# Patient Record
Sex: Female | Born: 1940
Health system: Southern US, Community
[De-identification: ages and names within clinical notes are randomized; demographics above are authoritative.]

## PROBLEM LIST (undated history)

## (undated) DIAGNOSIS — Z950 Presence of cardiac pacemaker: Secondary | ICD-10-CM

## (undated) DIAGNOSIS — F32A Depression, unspecified: Secondary | ICD-10-CM

## (undated) DIAGNOSIS — I4891 Unspecified atrial fibrillation: Secondary | ICD-10-CM

## (undated) DIAGNOSIS — F329 Major depressive disorder, single episode, unspecified: Secondary | ICD-10-CM

## (undated) DIAGNOSIS — I10 Essential (primary) hypertension: Secondary | ICD-10-CM

## (undated) DIAGNOSIS — G629 Polyneuropathy, unspecified: Secondary | ICD-10-CM

## (undated) HISTORY — DX: Major depressive disorder, single episode, unspecified: F32.9

## (undated) HISTORY — DX: Polyneuropathy, unspecified: G62.9

## (undated) HISTORY — DX: Depression, unspecified: F32.A

## (undated) HISTORY — PX: CATARACT EXTRACTION W/ INTRAOCULAR LENS  IMPLANT, BILATERAL: SHX1307

## (undated) HISTORY — PX: THROAT SURGERY: SHX803

---

## 1972-10-27 HISTORY — PX: VAGINAL HYSTERECTOMY: SUR661

## 1993-10-27 HISTORY — PX: BUNIONECTOMY: SHX129

## 1997-01-18 ENCOUNTER — Encounter: Payer: Self-pay | Admitting: Internal Medicine

## 2003-11-02 ENCOUNTER — Encounter: Payer: Self-pay | Admitting: Internal Medicine

## 2004-09-04 ENCOUNTER — Ambulatory Visit: Payer: Self-pay | Admitting: Internal Medicine

## 2004-10-07 ENCOUNTER — Ambulatory Visit: Payer: Self-pay | Admitting: Internal Medicine

## 2004-12-09 ENCOUNTER — Ambulatory Visit: Payer: Self-pay | Admitting: Internal Medicine

## 2005-04-10 ENCOUNTER — Ambulatory Visit: Payer: Self-pay | Admitting: Internal Medicine

## 2005-08-11 ENCOUNTER — Ambulatory Visit: Payer: Self-pay | Admitting: Internal Medicine

## 2005-12-15 ENCOUNTER — Ambulatory Visit: Payer: Self-pay | Admitting: Internal Medicine

## 2006-03-05 ENCOUNTER — Ambulatory Visit: Payer: Self-pay | Admitting: Internal Medicine

## 2006-07-23 ENCOUNTER — Ambulatory Visit: Payer: Self-pay | Admitting: Internal Medicine

## 2006-08-04 ENCOUNTER — Ambulatory Visit: Payer: Self-pay | Admitting: Internal Medicine

## 2006-12-10 ENCOUNTER — Ambulatory Visit: Payer: Self-pay | Admitting: Internal Medicine

## 2007-04-02 ENCOUNTER — Encounter: Payer: Self-pay | Admitting: Internal Medicine

## 2007-04-02 DIAGNOSIS — G609 Hereditary and idiopathic neuropathy, unspecified: Secondary | ICD-10-CM

## 2007-04-02 DIAGNOSIS — F329 Major depressive disorder, single episode, unspecified: Secondary | ICD-10-CM

## 2007-05-17 ENCOUNTER — Ambulatory Visit: Payer: Self-pay | Admitting: Internal Medicine

## 2007-09-17 ENCOUNTER — Ambulatory Visit: Payer: Self-pay | Admitting: Internal Medicine

## 2008-01-24 ENCOUNTER — Telehealth (INDEPENDENT_AMBULATORY_CARE_PROVIDER_SITE_OTHER): Payer: Self-pay | Admitting: *Deleted

## 2008-07-24 ENCOUNTER — Ambulatory Visit: Payer: Self-pay | Admitting: Internal Medicine

## 2009-01-02 ENCOUNTER — Ambulatory Visit: Payer: Self-pay | Admitting: Internal Medicine

## 2009-07-20 ENCOUNTER — Telehealth: Payer: Self-pay | Admitting: Internal Medicine

## 2010-01-21 ENCOUNTER — Telehealth: Payer: Self-pay | Admitting: Internal Medicine

## 2010-02-26 ENCOUNTER — Encounter: Payer: Self-pay | Admitting: Internal Medicine

## 2010-05-06 ENCOUNTER — Ambulatory Visit: Payer: Self-pay | Admitting: Internal Medicine

## 2010-05-06 DIAGNOSIS — G5620 Lesion of ulnar nerve, unspecified upper limb: Secondary | ICD-10-CM

## 2010-05-06 DIAGNOSIS — Z634 Disappearance and death of family member: Secondary | ICD-10-CM

## 2010-05-06 DIAGNOSIS — F4329 Adjustment disorder with other symptoms: Secondary | ICD-10-CM

## 2010-05-08 LAB — CONVERTED CEMR LAB
ALT: 11 units/L (ref 0–35)
AST: 17 units/L (ref 0–37)
Albumin: 4.3 g/dL (ref 3.5–5.2)
Basophils Relative: 0.6 % (ref 0.0–3.0)
Bilirubin, Direct: 0.2 mg/dL (ref 0.0–0.3)
Calcium: 9.8 mg/dL (ref 8.4–10.5)
Chloride: 107 meq/L (ref 96–112)
Eosinophils Relative: 1 % (ref 0.0–5.0)
Glucose, Bld: 84 mg/dL (ref 70–99)
Hemoglobin: 15.6 g/dL — ABNORMAL HIGH (ref 12.0–15.0)
Lymphocytes Relative: 43.2 % (ref 12.0–46.0)
MCV: 98.1 fL (ref 78.0–100.0)
Neutro Abs: 3.5 10*3/uL (ref 1.4–7.7)
Neutrophils Relative %: 49.4 % (ref 43.0–77.0)
Phosphorus: 3.4 mg/dL (ref 2.3–4.6)
Potassium: 4.9 meq/L (ref 3.5–5.1)
RBC: 4.56 M/uL (ref 3.87–5.11)
Sodium: 143 meq/L (ref 135–145)
Total Bilirubin: 0.6 mg/dL (ref 0.3–1.2)
Total Protein: 6.8 g/dL (ref 6.0–8.3)
WBC: 7.1 10*3/uL (ref 4.5–10.5)

## 2010-08-22 ENCOUNTER — Ambulatory Visit: Payer: Self-pay | Admitting: Internal Medicine

## 2010-09-27 ENCOUNTER — Ambulatory Visit: Payer: Self-pay | Admitting: Internal Medicine

## 2010-11-26 NOTE — Assessment & Plan Note (Signed)
Summary: NERVES/CLE   Vital Signs:  Patient profile:   70 year old female Weight:      162 pounds Temp:     98.8 degrees F oral BP sitting:   128 / 60  (left arm) Cuff size:   regular  Vitals Entered By: Mervin Hack CMA Duncan Dull) (August 22, 2010 2:50 PM) CC: nerves?   History of Present Illness: Still having lots of stress Husband is wearing her out "more emotionally than physically" won't allow her to shower him, complains all the time doesn't want to get out of bed in the morning  This has been his personality all his life More belligerent over the past couple of years especially Has aide from [place] 4hours, 3 days per week but he doesn't allow her to bathe him, etc  She has remodeled to put in handicapped shower has tried 3 different wheelchairs--he just can't be satisfied  trazodone does help sleep perhaps a little--but by the time she goes to sleep "I am so wound up"  Allergies: 1)  ! Penicillin V Potassium (Penicillin V Potassium)  Past History:  Past medical, surgical, family and social histories (including risk factors) reviewed for relevance to current acute and chronic problems.  Past Medical History: Reviewed history from 04/02/2007 and no changes required. Depression Neuropathy--idiopathic  Past Surgical History: Reviewed history from 04/02/2007 and no changes required. 1974    Hysterectomy 1963    VD X 1  ~1995  Bilateral foot surgery 3/98     Spinal cord stimulator, didn't help  Family History: Reviewed history from 04/02/2007 and no changes required. 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   Psych:  normally interactive, good eye contact, not anxious appearing, and dysphoric affect.     Impression & Recommendations:  Problem # 1:  DEPRESSION (ICD-311) Assessment Deteriorated mostly worse due to caregiver stress he doesn't allow others to care for him she needs help I will talk to him at his visit tomorrow Gave her [employer] of Rights  will try low dose citalopram  Her updated medication list for this problem includes:    Trazodone Hcl 50 Mg Tabs (Trazodone hcl) .Marland Kitchen... 1-2 tabs at bedtime to help sleep    Citalopram Hydrobromide 10 Mg Tabs (Citalopram hydrobromide) .Marland Kitchen... 1 tab daily for depression  Complete Medication List: 1)  Gabapentin 600 Mg Tabs (Gabapentin) .Marland Kitchen.. 1 tab at bedtime for nerve pain 2)  Trazodone Hcl 50 Mg Tabs (Trazodone hcl) .Marland Kitchen.. 1-2 tabs at bedtime to help sleep 3)  Aspir-low 81 Mg Tbec (Aspirin) .Marland Kitchen.. 1 by mouth daily 4)  Calcium Carbonate 600 Mg Tabs (Calcium carbonate) .... Take 1 by mouth once daily 5)  Citalopram Hydrobromide 10 Mg Tabs (Citalopram hydrobromide) .Marland Kitchen.. 1 tab daily for depression  Other Orders: Pneumococcal Vaccine (16109) Admin 1st Vaccine (60454) Influenza Vaccine MCR (09811)  Patient Instructions: 1)  Please schedule a follow-up appointment in 1 month.  Prescriptions: CITALOPRAM HYDROBROMIDE 10 MG TABS (CITALOPRAM HYDROBROMIDE) 1  tab daily for depression  #30 x 11   Entered and Authorized by:   Cindee Salt MD   Signed by:   Cindee Salt MD on 08/22/2010   Method used:   [facility]  [address] [phone][facility]       [address]       [place], [place]  [address]       Ph: [phone]       Fax: [phone]   RxID:   [id number]    Orders Added: 1)  Est. Patient Level III [24401] 2)  Pneumococcal Vaccine [90732] 3)  Admin 1st Vaccine  [90471] 4)  Influenza Vaccine MCR [00025]   Immunizations Administered:  Pneumonia Vaccine:    Vaccine Type: Pneumovax (Medicare)    Site: right deltoid    Mfr: Merck    Dose: 0.5 ml    Route: IM    Given by: Mervin Hack CMA (AAMA)    Exp. Date: 01/09/2012    Lot #: [id number]    VIS given: 10/01/09 version given August 22, 2010.  Influenza Vaccine # 1:    Vaccine Type: Fluvax MCR    Site: left deltoid    Mfr: Sanofi Pasteur    Dose: 0.5 ml    Route: IM    Given by: Mervin Hack CMA (AAMA)    Exp. Date: 04/26/2011    Lot #: [id number]    VIS given: 05/21/10 version given August 22, 2010.  Flu Vaccine Consent Questions:    Do you have a history of severe allergic reactions to this vaccine? no    Any prior history of allergic reactions to egg and/or gelatin? no    Do you have a sensitivity to the preservative Thimersol? no    Do you have a past history of Guillan-Barre Syndrome? no    Do you currently have an acute febrile illness? no    Have you ever had a severe reaction to latex? no    Vaccine information given and explained to patient? yes    Are you currently pregnant? no   Immunizations Administered:  Pneumonia Vaccine:    Vaccine Type: Pneumovax (Medicare)    Site: right deltoid    Mfr: Merck    Dose: 0.5 ml    Route: IM    Given by: Mervin Hack CMA (AAMA)    Exp. Date: 01/09/2012    Lot #: [id number]    VIS given: 10/01/09 version given August 22, 2010.  Influenza Vaccine # 1:    Vaccine Type: Fluvax MCR    Site: left deltoid    Mfr: Sanofi Pasteur    Dose: 0.5 ml    Route: IM    Given by: Mervin Hack CMA (AAMA)    Exp. Date: 04/26/2011    Lot #: [id number]    VIS given: 05/21/10 version given August 22, 2010.  Current Allergies (reviewed today): ! PENICILLIN V POTASSIUM (PENICILLIN V POTASSIUM)

## 2010-11-26 NOTE — Letter (Signed)
Summary: Handicapped Placard/[facility]  Handicapped Placard/[facility]   Imported By: Lanelle Bal 03/05/2010 11:29:29  _____________________________________________________________________  External Attachment:    Type:   Image     Comment:   External Document

## 2010-11-26 NOTE — Progress Notes (Signed)
Summary: NEURONTIN  Phone Note [facility]ill Request [id number] on January 21, 2010 4:07 PM  Refills Requested: Medication #1:  NEURONTIN 600 MG TABS 1/2 tab two times a day/.   Last Refilled: 01/04/2009 E-Scribe Request, pt was last seen in 2008, no appt has been made. Please advise.   Method Requested: Electronic Initial call taken by: Mervin Hack CMA Duncan Dull),  January 21, 2010 4:08 PM  Follow-up for Phone Call        set up appt when husband next in (or at another time if she prefers)  okay to send 2 months supply I see her regularly with husband so I didn't realize she hadn't been seen in that long May need to just check that she doesn't have 2 charts since it doesn't seem like it has been that long Follow-up by: Cindee Salt MD,  January 21, 2010 5:57 PM  Additional Follow-up for Phone Call Additional follow up Details #1::        patient only has 1 chart. DeShannon Smith CMA Duncan Dull)  January 22, 2010 9:04 AM     Prescriptions: NEURONTIN 600 MG TABS (GABAPENTIN) 1/2 tab two times a day/  #90 x 0   Entered by:   Mervin Hack CMA (AAMA)   Authorized by:   Cindee Salt MD   Signed by:   Mervin Hack CMA (AAMA) on 01/21/2010   Method used:   [facility]        [facility] [phone][facility] (retail)       [address]       [place], [place]  [address]       Ph: [phone]       Fax: [phone]   RxID:   [id number]

## 2010-11-26 NOTE — Assessment & Plan Note (Signed)
Summary: 1 M F/U DLO   Vital Signs:  Patient profile:   70 year old female Height:      62.5 inches Weight:      161.50 pounds BMI:     29.17 Temp:     98.8 degrees F oral Pulse rate:   68 / minute Pulse rhythm:   regular BP sitting:   130 / 80  (left arm) Cuff size:   large  Vitals Entered By: Linde Gillis CMA Duncan Dull) (September 27, 2010 1:56 PM) CC: 1 month follow up   History of Present Illness: She  is doing better The medication for husband has really helped his demeanor Has aide helping with his legs and he is making progress---better balance and use of leg  Much easier on her since he has better spirits still physically tired Now 8 years since his stroke and still has constant caregiving   Still not sleeping great--can initiate but can't maintain Discussed sleep hygiene   Allergies: 1)  ! Penicillin V Potassium (Penicillin V Potassium)  Past History:  Past medical, surgical, family and social histories (including risk factors) reviewed for relevance to current acute and chronic problems.  Past Medical History: Reviewed history from 04/02/2007 and no changes required. Depression Neuropathy--idiopathic  Past Surgical History: Reviewed history from 04/02/2007 and no changes required. 1974    Hysterectomy 1963    VD X 1  ~1995  Bilateral foot surgery 3/98     Spinal cord stimulator, didn't help  Family History: Reviewed history from 04/02/2007 and no changes required. Father: Died 40 colon CA Mother: Died 34 MI Siblings: 1 brother died 31 MI, S/P CABG 1 sister alive  No HTN DM:  Maternal GF  Social History: Reviewed history from 08/22/2010 and no changes required. 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   Psych:   normally interactive, good eye contact, not anxious appearing, and not depressed appearing.   Much more animated   Impression & Recommendations:  Problem # 1:  DEPRESSION (ICD-311) Assessment Improved clearly better---probably mostly due to husband's improvement on his treatment will still plan to continue the med---at least 6-9 months from now  Her updated medication list for this problem includes:    Trazodone Hcl 50 Mg Tabs (Trazodone hcl) .Marland Kitchen... 1-2 tabs at bedtime to help sleep    Citalopram Hydrobromide 10 Mg Tabs (Citalopram hydrobromide) .Marland Kitchen... 1 tab daily for depression  Complete Medication List: 1)  Gabapentin 600 Mg Tabs (Gabapentin) .Marland Kitchen.. 1 tab at bedtime for nerve pain 2)  Trazodone Hcl 50 Mg Tabs (Trazodone hcl) .Marland Kitchen.. 1-2 tabs at bedtime to help sleep 3)  Aspir-low 81 Mg Tbec (Aspirin) .Marland Kitchen.. 1 by mouth daily 4)  Calcium Carbonate 600 Mg Tabs (Calcium carbonate) .... Take 1 by mouth once daily 5)  Citalopram Hydrobromide 10 Mg Tabs (Citalopram hydrobromide) .Marland Kitchen.. 1 tab daily for depression  Patient Instructions: 1)  Please schedule a follow-up appointment in 6 months .    Orders Added: 1)  Est. Patient Level III [37628]    Current Allergies (reviewed today): ! PENICILLIN V POTASSIUM (PENICILLIN V POTASSIUM)

## 2010-11-26 NOTE — Assessment & Plan Note (Signed)
Summary: NEEDS APT HAS NOT SEEN IN AWHILE/DLO   Vital Signs:  Patient profile:   70 year old female Height:      62.5 inches Weight:      155 pounds BMI:     28.00 Temp:     98.3 degrees F oral Pulse rate:   60 / minute Pulse rhythm:   regular BP sitting:   140 / 70  (left arm) Cuff size:   regular  Vitals Entered By: Mervin Hack CMA Duncan Dull) (May 06, 2010 12:22 PM) CC: med refill, check pinky finger   History of Present Illness: hasn't been seen in years I do see her with husband Had a couple of times when she called and didn't get a call back  Generally has been okay Tough year with husband--multiple medical issues Lots of stress on her Hasn't been sleeping well Goes back years but worse lately has walked in sleep a couple of times in past month Initiates quickly but sleeps only 3-4 hours, then goes back to sleep  Sill on neurontin for her neuropathy Coconut oil capsules seem to have helped this  Ongoing mood problems relates mostly to husband's issues--as usual does worsen with the sleep problems recognizes that he will succumb to his medical problems--this upsets her  has numbness in left 5th finger may be related to dog pulling on her leash--75# describes ulnar distribution numbness No hand weakness  Contraindications/Deferment of Procedures/Staging:    Test/Procedure: Colonoscopy    Reason for deferment: patient declined     Test/Procedure: Mammogram    Reason for deferment: patient declined     Test/Procedure: PAP Smear    Reason for deferment: patient declined   Allergies: 1)  ! Penicillin V Potassium (Penicillin V Potassium)  Past History:  Past medical, surgical, family and social histories (including risk factors) reviewed for relevance to current acute and chronic problems.  Past Medical History: Reviewed history from 04/02/2007 and no changes required. Depression Neuropathy--idiopathic  Past Surgical History: Reviewed history from  04/02/2007 and no changes required. 1974    Hysterectomy 1963    VD X 1  ~1995  Bilateral foot surgery 3/98     Spinal cord stimulator, didn't help  Family History: Reviewed history from 04/02/2007 and no changes required. Father: Died 7 colon CA Mother: Died 55 MI Siblings: 1 brother died 83 MI, S/P CABG 1 sister alive  No HTN DM:  Maternal GF  Social History: Reviewed history from 04/02/2007 and no changes required. 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   Neck:  supple, no masses, no thyromegaly, no carotid bruits, and no cervical lymphadenopathy.   Lungs:  normal respiratory effort and normal breath sounds.   Heart:  normal rate, regular rhythm, no murmur, and no gallop.   Abdomen:  soft and non-tender.   Msk:  no joint tenderness and no joint swelling.   Extremities:  no sig edema Neurologic:  alert & oriented X3, strength normal in all extremities, and gait normal.   Psych:  normally interactive, good eye contact, not anxious appearing, and not depressed appearing.     Impression & Recommendations:  Problem # 1:  SLEEP DISORDER (ICD-780.50) Assessment New lots of stress  will try trazodone--may help mood issues as well  Problem # 2:  ULNAR NEUROPATHY (ICD-354.2) Assessment: New seems related to dog pulling on leash may need to change how she walks him  Problem # 3:  NEUROPATHY, IDIOPATHIC (ICD-356.0) Assessment: Comment Only  controlled with gabapentin and coconut oil will check labs  Orders: TLB-Renal Function Panel (80069-RENAL) TLB-CBC Platelet - w/Differential (85025-CBCD) TLB-TSH (Thyroid Stimulating Hormone) (84443-TSH) TLB-Hepatic/Liver Function Pnl  (80076-HEPATIC) Venipuncture (52841) TLB-B12, Serum-Total ONLY (32440-N02)  Problem # 4:  DEPRESSION (ICD-311) Assessment: Unchanged  mild and mostly reactive no specific Rx for this  Her updated medication list for this problem includes:    Trazodone Hcl 50 Mg Tabs (Trazodone hcl) .Marland Kitchen... 1-2 tabs at bedtime to help sleep  Complete Medication List: 1)  Neurontin 600 Mg Tabs (Gabapentin) .Marland Kitchen.. 1 tab nightly for neuropathy 2)  Aspir-low 81 Mg Tbec (Aspirin) .Marland Kitchen.. 1 by mouth daily 3)  Calcium Carbonate 600 Mg Tabs (Calcium carbonate) .... Take 1 by mouth once daily 4)  Gabapentin 600 Mg Tabs (Gabapentin) .Marland Kitchen.. 1 tab at bedtime for nerve pain 5)  Trazodone Hcl 50 Mg Tabs (Trazodone hcl) .Marland Kitchen.. 1-2 tabs at bedtime to help sleep  Patient Instructions: 1)  Please schedule a follow-up appointment in 2 months with husband Prescriptions: TRAZODONE HCL 50 MG TABS (TRAZODONE HCL) 1-2 tabs at bedtime to help sleep  #60 x 3   Entered and Authorized by:   Cindee Salt MD   Signed by:   Cindee Salt MD on 05/06/2010   Method used:   [facility]        [facility]  [address] [phone][facility] (retail)       [address]       [place], [place]  [address]       Ph: [phone]       Fax: [phone]   RxID:   [id number] GABAPENTIN 600 MG TABS (GABAPENTIN) 1 tab at bedtime for nerve pain  #30 x 12   Entered and Authorized by:   Cindee Salt MD   Signed by:   Cindee Salt MD on 05/06/2010   Method used:   [facility]ically to        [facility]  [address] [phone][facility] (retail)       [address]       [place], [place]  [address]       Ph: [phone]       Fax: [phone]   RxID:   [id number]   Current Allergies (reviewed today): ! PENICILLIN V POTASSIUM (PENICILLIN V POTASSIUM)

## 2011-02-05 ENCOUNTER — Encounter: Payer: Self-pay | Admitting: Internal Medicine

## 2011-02-07 ENCOUNTER — Encounter: Payer: Self-pay | Admitting: Internal Medicine

## 2011-02-07 ENCOUNTER — Ambulatory Visit (INDEPENDENT_AMBULATORY_CARE_PROVIDER_SITE_OTHER): Payer: 59 | Admitting: Internal Medicine

## 2011-02-07 VITALS — BP 126/60 | HR 58 | Temp 97.9°F | Ht 62.0 in | Wt 161.0 lb

## 2011-02-07 DIAGNOSIS — G608 Other hereditary and idiopathic neuropathies: Secondary | ICD-10-CM

## 2011-02-07 MED ORDER — GABAPENTIN (ONCE-DAILY) 300 MG PO TABS: 0.5000 | ORAL_TABLET | Freq: Two times a day (BID) | ORAL | Status: DC

## 2011-02-07 NOTE — Progress Notes (Signed)
  Subjective:    Patient ID: Heidi Carroll, female    DOB: Jan 16, 1941, 70 y.o.   MRN: [id number]  HPI Having terrible problems with her feet Worsening now   Problem goes back for years Wants referral to deal with issue  Burning pain is terrible Can't be up shopping for long due to worsening Can't wear closed shoes Outside feels cool---inside is "burning like fire"  Did have nerve conduction tests in past without clear cut diagnosis Gone to [facility] without clear answers  Gabapentin does relieve it some so she can go to sleep eventually Only uses 300mg --afraid she won't hear her husband Rarely tries half a tab in daytime---makes her feel kinda "fuzzy" Knows she couldn't drive  Current outpatient prescriptions:aspirin 81 MG tablet, Take 81 mg by mouth daily.  , Disp: , Rfl: ;  calcium carbonate (OS-CAL) 600 MG TABS, Take 600 mg by mouth daily.  , Disp: , Rfl: ;  citalopram (CELEXA) 10 MG tablet, Take 10 mg by mouth daily.  , Disp: , Rfl: ;  gabapentin (NEURONTIN) 600 MG tablet, Take 600 mg by mouth at bedtime.  , Disp: , Rfl:  traZODone (DESYREL) 50 MG tablet, Take one to two tablets at bedtime to help sleep , Disp: , Rfl:   Past Medical History  Diagnosis Date  . Depression   . Neuropathy     idiopathic    Past Surgical History  Procedure Date  . Vaginal hysterectomy 1974  . Vaginal delivery 1963    X 1  . Foot surgery 1995    bilateral   . Spinal cord stimulator implant 12/1996    didn't keep    Family History  Problem Relation Age of Onset  . Cancer Father   . Diabetes Maternal Grandfather     History   Social History  . Marital Status: Married    Spouse Name: N/A    Number of Children: 1  . Years of Education: N/A   Occupational History  . Retired, [employer])    Social History Main Topics  . Smoking status: Current Everyday Smoker  . Smokeless tobacco: Not on file  . Alcohol Use: No  . Drug Use: No  . Sexually Active:    Other  Topics Concern  . Not on file   Social History Narrative  . No narrative on file   Review of Systems No mechanical foot problems Strong family history of neuropathy Arches are okay Has started walking in her sleep again--found things moved in morning and knows it had to be her (dog or husband couldn't do it)    Objective:   Physical Exam  Constitutional: She appears well-developed and well-nourished. No distress.  Cardiovascular: Intact distal pulses.   Neurological:       Normal fine touch in feet  Psychiatric: She has a normal mood and affect. Her behavior is normal. Judgment and thought content normal.          Assessment & Plan:

## 2011-02-07 NOTE — Patient Instructions (Signed)
Keep June appointment The dose of gabapentin can be increased further if you are not having side effects

## 2011-02-10 ENCOUNTER — Telehealth: Payer: Self-pay | Admitting: *Deleted

## 2011-02-10 NOTE — Telephone Encounter (Signed)
I thought they came as tablets that she could split--so she could go up slowly on the dose If only capsules available, tell her she cannot split----but she may want to take extra one on one day, and skip the next, till her body gets used to them

## 2011-02-10 NOTE — Telephone Encounter (Signed)
[facility] [place] [facility]'s [facility]entin [facility].  They said it was written as gralise capsules with instructions to take one half to one twice a day and one to two at bedtime.  Being capsules, she cant split them.  Also, pharmacist says pt has always been on 600 mg's, one at bedtime.  Please clarify.

## 2011-02-11 MED ORDER — GABAPENTIN 300 MG PO CAPS: 300.0000 mg | ORAL_CAPSULE | Freq: Two times a day (BID) | ORAL | Status: DC

## 2011-02-11 NOTE — Telephone Encounter (Signed)
Called and spoke with husband and he stated pt wasn't home ask me to call back later.

## 2011-02-12 MED ORDER — GABAPENTIN 300 MG PO CAPS: 300.0000 mg | ORAL_CAPSULE | Freq: Three times a day (TID) | ORAL | Status: DC | PRN

## 2011-02-12 NOTE — Telephone Encounter (Signed)
Spoke with patient and she states she was originally taking 600mg  caplets and sometimes if needed she would spilt those in half and take only half. She stated Dr.Letvak mis-understood, pt requests 90capsules, rx sent to pharmacy.

## 2011-02-14 ENCOUNTER — Ambulatory Visit: Payer: Self-pay | Admitting: Internal Medicine

## 2011-04-04 ENCOUNTER — Ambulatory Visit: Payer: Self-pay | Admitting: Internal Medicine

## 2011-04-14 ENCOUNTER — Ambulatory Visit (INDEPENDENT_AMBULATORY_CARE_PROVIDER_SITE_OTHER): Payer: 59 | Admitting: Internal Medicine

## 2011-04-14 ENCOUNTER — Encounter: Payer: Self-pay | Admitting: Internal Medicine

## 2011-04-14 DIAGNOSIS — G479 Sleep disorder, unspecified: Secondary | ICD-10-CM

## 2011-04-14 DIAGNOSIS — G608 Other hereditary and idiopathic neuropathies: Secondary | ICD-10-CM

## 2011-04-14 NOTE — Assessment & Plan Note (Signed)
Will try higher dose

## 2011-04-14 NOTE — Progress Notes (Signed)
  Subjective:    Patient ID: Heidi Carroll, female    DOB: May 19, 1941, 70 y.o.   MRN: [id number]  HPI Lots of stress Husband is in hospital--should be ready to leave soon Infection seems cleared but they are evaluating abnormalities in pelvis (which are old per wife)  Still has the neuropathic pain Taking 600mg  at bedtime--this helps her sleep for a while (but does have middle of the night awakening) Hasn't been using it during the day except rarely--doesn't trust herself driving etc Has been able to tolerate the pain better---she is afraid of any sedation   Still takes only 50mg  of trazodone at night Hasn't tried more but discussed  Some mood problems No persistent depression Does think about "the reality of his condition"--realizes that his health may fade  Current Outpatient Prescriptions on File Prior to Visit  Medication Sig Dispense Refill  . aspirin 81 MG tablet Take 81 mg by mouth daily.        . calcium carbonate (OS-CAL) 600 MG TABS Take 600 mg by mouth daily.        . citalopram (CELEXA) 10 MG tablet Take 10 mg by mouth daily.        Marland Kitchen gabapentin (NEURONTIN) 300 MG capsule Take 1 capsule (300 mg total) by mouth 3 (three) times daily as needed.  90 capsule  2  . traZODone (DESYREL) 50 MG tablet Take one to two tablets at bedtime to help sleep        Past Medical History  Diagnosis Date  . Depression   . Neuropathy     idiopathic    Past Surgical History  Procedure Date  . Vaginal hysterectomy 1974  . Vaginal delivery 1963    X 1  . Foot surgery 1995    bilateral   . Spinal cord stimulator implant 12/1996    didn't keep    Family History  Problem Relation Age of Onset  . Cancer Father   . Diabetes Maternal Grandfather     History   Social History  . Marital Status: Married    Spouse Name: N/A    Number of Children: 1  . Years of Education: N/A   Occupational History  . Retired, [employer])    Social History Main Topics  . Smoking  status: Current Everyday Smoker  . Smokeless tobacco: Not on file  . Alcohol Use: No  . Drug Use: No  . Sexually Active:    Other Topics Concern  . Not on file   Social History Narrative  . No narrative on file   Review of Systems Appetite is fine  weight is fairly stable Has been walking a lot with husband in hospital     Objective:   Physical Exam        Assessment & Plan:

## 2011-04-14 NOTE — Assessment & Plan Note (Signed)
Some better with higher gabapentin but generally can't take it during the day Satisfied at this point

## 2011-04-14 NOTE — Patient Instructions (Signed)
Please try 2 of the trazodone to see if that helps you sleep longer

## 2011-04-16 ENCOUNTER — Other Ambulatory Visit: Payer: Self-pay | Admitting: *Deleted

## 2011-04-16 MED ORDER — CITALOPRAM HYDROBROMIDE 10 MG PO TABS: 10.0000 mg | ORAL_TABLET | Freq: Every day | ORAL | Status: DC

## 2011-04-16 MED ORDER — TRAZODONE HCL 50 MG PO TABS: ORAL_TABLET | ORAL | Status: DC

## 2011-04-16 MED ORDER — GABAPENTIN 300 MG PO CAPS: 300.0000 mg | ORAL_CAPSULE | Freq: Three times a day (TID) | ORAL | Status: DC | PRN

## 2011-04-16 NOTE — Telephone Encounter (Signed)
Faxed requests from [facility] [place] are on your desk.  They are asking for 90 day supplies of meds.

## 2011-08-11 ENCOUNTER — Ambulatory Visit: Payer: Medicare Other

## 2011-08-16 ENCOUNTER — Other Ambulatory Visit: Payer: Self-pay | Admitting: Internal Medicine

## 2011-10-04 ENCOUNTER — Other Ambulatory Visit: Payer: Self-pay | Admitting: Internal Medicine

## 2011-10-14 ENCOUNTER — Ambulatory Visit: Payer: Medicare Other | Admitting: Internal Medicine

## 2011-11-05 ENCOUNTER — Ambulatory Visit (INDEPENDENT_AMBULATORY_CARE_PROVIDER_SITE_OTHER): Payer: Medicare Other | Admitting: Internal Medicine

## 2011-11-05 ENCOUNTER — Encounter: Payer: Self-pay | Admitting: Internal Medicine

## 2011-11-05 VITALS — BP 140/80 | HR 52 | Temp 98.1°F | Ht 62.0 in | Wt 150.0 lb

## 2011-11-05 DIAGNOSIS — R6889 Other general symptoms and signs: Secondary | ICD-10-CM

## 2011-11-05 DIAGNOSIS — R200 Anesthesia of skin: Secondary | ICD-10-CM

## 2011-11-05 DIAGNOSIS — G608 Other hereditary and idiopathic neuropathies: Secondary | ICD-10-CM

## 2011-11-05 DIAGNOSIS — G479 Sleep disorder, unspecified: Secondary | ICD-10-CM | POA: Diagnosis not present

## 2011-11-05 DIAGNOSIS — F329 Major depressive disorder, single episode, unspecified: Secondary | ICD-10-CM

## 2011-11-05 DIAGNOSIS — R209 Unspecified disturbances of skin sensation: Secondary | ICD-10-CM | POA: Diagnosis not present

## 2011-11-05 DIAGNOSIS — R4584 Anhedonia: Secondary | ICD-10-CM

## 2011-11-05 LAB — HEPATIC FUNCTION PANEL
ALT: 10 U/L (ref 0–35)
AST: 15 U/L (ref 0–37)
Albumin: 4.1 g/dL (ref 3.5–5.2)

## 2011-11-05 LAB — CBC WITH DIFFERENTIAL/PLATELET
Basophils Relative: 0.5 % (ref 0.0–3.0)
Eosinophils Relative: 1.5 % (ref 0.0–5.0)
HCT: 44.1 % (ref 36.0–46.0)
Hemoglobin: 14.9 g/dL (ref 12.0–15.0)
Lymphs Abs: 2.6 10*3/uL (ref 0.7–4.0)
MCV: 99.7 fl (ref 78.0–100.0)
Monocytes Absolute: 0.4 10*3/uL (ref 0.1–1.0)
RBC: 4.42 Mil/uL (ref 3.87–5.11)
WBC: 7 10*3/uL (ref 4.5–10.5)

## 2011-11-05 LAB — BASIC METABOLIC PANEL
Chloride: 107 mEq/L (ref 96–112)
Potassium: 4.8 mEq/L (ref 3.5–5.1)

## 2011-11-05 LAB — VITAMIN B12: Vitamin B-12: 344 pg/mL (ref 211–911)

## 2011-11-05 NOTE — Assessment & Plan Note (Signed)
Ongoing situational stress Not clear that med is helping but will continue

## 2011-11-05 NOTE — Progress Notes (Signed)
Subjective:    Patient ID: Heidi Carroll, female    DOB: October 13, 1941, 71 y.o.   MRN: [id number]  HPI Doing okay Discussed cigarettes---she has stress with husband and [facility] and doesn't feel she could stop now  Husband's condition continues to worsen In bed all the time since his wheelchair is broken and wife can't get support from [place] Is helped by hospice though--gives her some support  Some degree of anhedonia considering her husband and stress with his condition Worries about him if she goes out, etc Still not sleeping well. Trazodone does help initiate but then wakes up. Has been concerned that she wouldn't hear her husband Still on the citalopram--hard to tell if it is helping  Still on the gabapentin Gives reasonable relief from nerve pain in feet  Current Outpatient Prescriptions on File Prior to Visit  Medication Sig Dispense Refill  . aspirin 81 MG tablet Take 81 mg by mouth daily.        . calcium carbonate (OS-CAL) 600 MG TABS Take 600 mg by mouth daily.        . citalopram (CELEXA) 10 MG tablet TAKE 1 TABLET DAILY FOR DEPRESSION  90 tablet  3  . gabapentin (NEURONTIN) 300 MG capsule Take 1 capsule (300 mg total) by mouth 3 (three) times daily as needed.  270 capsule  3  . gabapentin (NEURONTIN) 600 MG tablet TAKE 1 TABLET BY MOUTH AT BEDTIME FOR NERVE PAIN  90 tablet  3  . traZODone (DESYREL) 50 MG tablet Take one to two tablets at bedtime to help sleep  180 tablet  3    Allergies  Allergen Reactions  . Penicillins     Past Medical History  Diagnosis Date  . Depression   . Neuropathy     idiopathic    Past Surgical History  Procedure Date  . Vaginal hysterectomy 1974  . Vaginal delivery 1963    X 1  . Foot surgery 1995    bilateral   . Spinal cord stimulator implant 12/1996    didn't keep    Family History  Problem Relation Age of Onset  . Cancer Father   . Diabetes Maternal Grandfather     History   Social History  . Marital Status: Married   Spouse Name: N/A    Number of Children: 1  . Years of Education: N/A   Occupational History  . Retired, [employer])    Social History Main Topics  . Smoking status: Current Everyday Smoker  . Smokeless tobacco: Never Used   Comment: has cut back due to husband's oxygen in house. Info given on 1-800-QUIT NOW  . Alcohol Use: No  . Drug Use: No  . Sexually Active: Not on file   Other Topics Concern  . Not on file   Social History Narrative  . No narrative on file   Review of Systems No chest pan No SOB     Objective:   Physical Exam  Constitutional: She appears well-developed and well-nourished. No distress.  Neck: Normal range of motion. Neck supple. No thyromegaly present.  Cardiovascular: Normal rate, regular rhythm and normal heart sounds.  Exam reveals no gallop.   No murmur heard. Pulmonary/Chest: Effort normal and breath sounds normal. No respiratory distress. She has no wheezes. She has no rales.  Abdominal: Soft. There is no tenderness.  Musculoskeletal: She exhibits no edema and no tenderness.  Lymphadenopathy:    She has no cervical adenopathy.  Psychiatric: She has a  normal mood and affect. Her behavior is normal. Judgment and thought content normal.          Assessment & Plan:

## 2011-11-05 NOTE — Assessment & Plan Note (Signed)
Ongoing problems with the stress Discussed increasing to 100mg  at bedtime

## 2011-11-05 NOTE — Assessment & Plan Note (Signed)
Still gets relief from pain in feet with the gabapentin Will continue

## 2012-05-04 ENCOUNTER — Other Ambulatory Visit: Payer: Self-pay | Admitting: *Deleted

## 2012-05-04 ENCOUNTER — Encounter: Payer: Medicare Other | Admitting: Internal Medicine

## 2012-05-04 MED ORDER — TRAZODONE HCL 50 MG PO TABS: ORAL_TABLET | ORAL | Status: DC

## 2012-09-02 ENCOUNTER — Other Ambulatory Visit: Payer: Self-pay | Admitting: Internal Medicine

## 2013-04-11 ENCOUNTER — Telehealth: Payer: Self-pay | Admitting: Internal Medicine

## 2013-04-11 ENCOUNTER — Ambulatory Visit (INDEPENDENT_AMBULATORY_CARE_PROVIDER_SITE_OTHER): Payer: Medicare Other | Admitting: Family Medicine

## 2013-04-11 ENCOUNTER — Encounter: Payer: Self-pay | Admitting: Family Medicine

## 2013-04-11 VITALS — BP 134/72 | HR 80 | Temp 98.4°F | Wt 144.0 lb

## 2013-04-11 DIAGNOSIS — J019 Acute sinusitis, unspecified: Secondary | ICD-10-CM

## 2013-04-11 MED ORDER — DOXYCYCLINE HYCLATE 100 MG PO CAPS: 100.0000 mg | ORAL_CAPSULE | Freq: Two times a day (BID) | ORAL | Status: DC

## 2013-04-11 MED ORDER — GUAIFENESIN-CODEINE 100-10 MG/5ML PO SYRP: 5.0000 mL | ORAL_SOLUTION | Freq: Every evening | ORAL | Status: DC | PRN

## 2013-04-11 NOTE — Telephone Encounter (Signed)
Will route to dr g

## 2013-04-11 NOTE — Telephone Encounter (Signed)
Will see today.  

## 2013-04-11 NOTE — Progress Notes (Signed)
  Subjective:    Patient ID: Heidi Carroll, female    DOB: [birth year], 72 y.o.   MRN: [id number]  HPI CC: sinusitis?  Pleasant 72 yo continued smoker presents with 5d h/o sinus drainage into throat, blowing nose leading to clear raw drainage.  Progressively worsening.  + L ear pain which improved after flushing ear.  + HA from sinus congestion.  Minimally productive coming from throat drainage.    So far has tried mucinex and OTC cough syrup to no avail.  No fevers/chills, abd pain, tooth pain, rashes,   No sick contacts at home. Continues smoking - since childhood. No h/o asthma, COPD.  Past Medical History  Diagnosis Date  . Depression   . Neuropathy     idiopathic     Review of Systems Per HPI    Objective:   Physical Exam  Nursing note and vitals reviewed. Constitutional: She appears well-developed and well-nourished. No distress.  HENT:  Head: Normocephalic and atraumatic.  Right Ear: Hearing, tympanic membrane, external ear and ear canal normal.  Left Ear: Hearing, external ear and ear canal normal.  Nose: No mucosal edema or rhinorrhea. Right sinus exhibits no maxillary sinus tenderness and no frontal sinus tenderness. Left sinus exhibits no maxillary sinus tenderness and no frontal sinus tenderness.  Mouth/Throat: Uvula is midline, oropharynx is clear and moist and mucous membranes are normal. No oropharyngeal exudate, posterior oropharyngeal edema, posterior oropharyngeal erythema or tonsillar abscesses.  L TM retracted, but good light reflex  Eyes: Conjunctivae and EOM are normal. Pupils are equal, round, and reactive to light. No scleral icterus.  Neck: Normal range of motion. Neck supple.  Cardiovascular: Normal rate, regular rhythm, normal heart sounds and intact distal pulses.   No murmur heard. Pulmonary/Chest: Effort normal and breath sounds normal. No respiratory distress. She has no wheezes. She has no rales.  Coarse breath sounds throughout, no focal  findings  Lymphadenopathy:    She has no cervical adenopathy.  Skin: Skin is warm and dry. No rash noted.       Assessment & Plan:

## 2013-04-11 NOTE — Assessment & Plan Note (Signed)
Anticipate viral - discussed this. If persistent sxs into end of week, or any worsening, fill abx. If worsening cough, or any trouble breathing, to return for chest xray in this longtime smoker. Pt agrees with plan.

## 2013-04-11 NOTE — Telephone Encounter (Signed)
Patient Information:  Caller Name: Nisa  Phone: [phone]  Patient: Heidi Carroll, Heidi Carroll  Gender: Female  DOB: 10/04/1941  Age: 72 Years  PCP: Tillman Abide [facility])  Office Follow Up:  Does the office need to follow up with this patient?: No  Instructions For The Office: N/A   Symptoms  Reason For Call & Symptoms: Pt is calling and states that she is having alot of nasal discharge and congestion;  having large amounts of nasal discharge but states that clear in color;  cough;  having a hard time sleeping with sx; no fever;  Reviewed Health History In EMR: Yes  Reviewed Medications In EMR: Yes  Reviewed Allergies In EMR: Yes  Reviewed Surgeries / Procedures: Yes  Date of Onset of Symptoms: 04/06/2013  Treatments Tried: Mucinex  Treatments Tried Worked: No  Guideline(s) Used:  Sinus Pain and Congestion  Disposition Per Guideline:   See Today or Tomorrow in Office  Reason For Disposition Reached:   Using nasal washes and pain medicine > 24 hours and sinus pain (lower forehead, cheekbone, or eye) persists  Advice Given:  For a Runny Nose With Profuse Discharge:  Nasal mucus and discharge helps to wash viruses and bacteria out of the nose and sinuses.  For a Stuffy Nose - Use Nasal Washes:  Introduction: Saline (salt water) nasal irrigation (nasal wash) is an effective and simple home remedy for treating stuffy nose and sinus congestion. The nose can be irrigated by pouring, spraying, or squirting salt water into the nose and then letting it run back out.  Pain and Fever Medicines:  For pain or fever relief, take either acetaminophen or ibuprofen.  Call Back If:   You become worse.  Patient Will Follow Care Advice:  YES  Appointment Scheduled:  04/11/2013 14:45:00 Appointment Scheduled Provider:  Eustaquio Boyden [facility])

## 2013-04-11 NOTE — Patient Instructions (Addendum)
I do think you have sinus infection, but unsure if viral or bacterial. Fill doxycycline if not improving each day. Push fluids and plenty of rest. Nasal saline irrigation or neti pot to help drain sinuses. May continue plain mucinex with plenty of fluid to help mobilize mucous. If fever >101, worsening productive cough, or not improving as expected after antibiotic, return to see [place] for xray.

## 2013-07-27 ENCOUNTER — Ambulatory Visit (INDEPENDENT_AMBULATORY_CARE_PROVIDER_SITE_OTHER): Payer: Medicare Other

## 2013-07-27 DIAGNOSIS — Z23 Encounter for immunization: Secondary | ICD-10-CM | POA: Diagnosis not present

## 2013-07-29 DIAGNOSIS — Z970 Presence of artificial eye: Secondary | ICD-10-CM | POA: Diagnosis not present

## 2014-03-21 ENCOUNTER — Ambulatory Visit (INDEPENDENT_AMBULATORY_CARE_PROVIDER_SITE_OTHER): Payer: Medicare Other | Admitting: Internal Medicine

## 2014-03-21 ENCOUNTER — Encounter: Payer: Self-pay | Admitting: Internal Medicine

## 2014-03-21 VITALS — BP 130/78 | HR 59 | Temp 98.2°F | Wt 158.0 lb

## 2014-03-21 DIAGNOSIS — F4321 Adjustment disorder with depressed mood: Secondary | ICD-10-CM | POA: Diagnosis not present

## 2014-03-21 DIAGNOSIS — G608 Other hereditary and idiopathic neuropathies: Secondary | ICD-10-CM

## 2014-03-21 DIAGNOSIS — Z634 Disappearance and death of family member: Principal | ICD-10-CM

## 2014-03-21 DIAGNOSIS — F4329 Adjustment disorder with other symptoms: Secondary | ICD-10-CM

## 2014-03-21 MED ORDER — DULOXETINE HCL 30 MG PO CPEP: 30.0000 mg | ORAL_CAPSULE | Freq: Every day | ORAL | Status: DC

## 2014-03-21 NOTE — Assessment & Plan Note (Signed)
This is worse now Will continue the gabapentin duloxetine if insurance will cover (?generic)

## 2014-03-21 NOTE — Assessment & Plan Note (Signed)
Discussed social interaction--?volunteer work Will start antidepressant Asked her to call bereavement counselor Early follow up

## 2014-03-21 NOTE — Progress Notes (Signed)
Pre visit review using our clinic review tool, if applicable. No additional management support is needed unless otherwise documented below in the visit note. 

## 2014-03-21 NOTE — Progress Notes (Signed)
   Subjective:    Patient ID: Heidi Carroll, female    DOB: 04-Sep-1941, 73 y.o.   MRN: [id number]  HPI Having a hard time with grieving Thought she was improving--but isn't Hasn't sought bereavement counselor---not good at talking  Having trouble "living" Tried to quit smoking-- "and that like brought me to suicide" Uses gum and patches  Will get to near "panic state" Feels like something is "going to happen" Moves around the house--- finally passes Goes frequently to cemetary---but feels worse after  Goes to get grocery and other necessary places Feels religious but doesn't go to church No family around--hasn't seen anyone lately Does have thoughts of death--but no suicidal thoughts (other than when she tried to quit smoking)  Had been his caregiver since stroke in 2003  Current Outpatient Prescriptions on File Prior to Visit  Medication Sig Dispense Refill  . aspirin 81 MG tablet Take 81 mg by mouth daily.        . calcium carbonate (OS-CAL) 600 MG TABS Take 600 mg by mouth daily.        Marland Kitchen gabapentin (NEURONTIN) 600 MG tablet TAKE 1 TABLET BY MOUTH AT BEDTIME FOR NERVE PAIN  90 tablet  3   No current facility-administered medications on file prior to visit.    Allergies  Allergen Reactions  . Penicillins     Past Medical History  Diagnosis Date  . Depression   . Neuropathy     idiopathic    Past Surgical History  Procedure Laterality Date  . Vaginal hysterectomy  1974  . Vaginal delivery  1963    X 1  . Foot surgery  1995    bilateral   . Spinal cord stimulator implant  12/1996    didn't keep    Family History  Problem Relation Age of Onset  . Cancer Father   . Diabetes Maternal Grandfather     History   Social History  . Marital Status: Widowed    Spouse Name: N/A    Number of Children: 1  . Years of Education: N/A   Occupational History  . Retired, [employer])    Social History Main Topics  . Smoking status: Current Every Day  Smoker -- 1.00 packs/day    Types: Cigarettes  . Smokeless tobacco: Never Used     Comment: . Info given on 1-800-QUIT NOW  . Alcohol Use: No  . Drug Use: No  . Sexual Activity: Not on file   Other Topics Concern  . Not on file   Social History Narrative   Husband died 08-11-2023   Review of Systems Appetite is variable Weight stable Sleeps poorly--about 4 hours per night    Objective:   Physical Exam  Constitutional: She appears well-developed and well-nourished. No distress.  Psychiatric:  Depressed mood Mildly constricted affect Normal thought process Appropriate speech and appearance          Assessment & Plan:

## 2014-03-21 NOTE — Patient Instructions (Signed)
Please start the duloxetine 30mg  daily Call hospice [phone]---- to speak to a bereavement counselor. Please consider volunteer work-- like at [employer] or [employer]

## 2014-03-22 ENCOUNTER — Telehealth: Payer: Self-pay | Admitting: Internal Medicine

## 2014-03-22 NOTE — Telephone Encounter (Signed)
Relevant patient education mailed to patient.  

## 2014-03-27 ENCOUNTER — Telehealth: Payer: Self-pay

## 2014-03-27 MED ORDER — SERTRALINE HCL 50 MG PO TABS: 50.0000 mg | ORAL_TABLET | Freq: Every day | ORAL | Status: DC

## 2014-03-27 NOTE — Telephone Encounter (Signed)
Pt started Cymbalta; pt said she takes Cymbalta after eating and still causes severe nausea; pt has tried taking in AM and also in PM with not improvement in nausea. Pt request different med to [facility]. Pt request cb.

## 2014-03-27 NOTE — Telephone Encounter (Signed)
rx sent to pharmacy by e-script Spoke with patient and advised results  Allergy list updated

## 2014-03-27 NOTE — Telephone Encounter (Signed)
Please stop the duloxetine and put on allergy list as not tolerated (not an allergy)  Have her try sertraline 50mg  daily #30 x 1 Keep her scheduled follow up

## 2014-04-25 ENCOUNTER — Ambulatory Visit (INDEPENDENT_AMBULATORY_CARE_PROVIDER_SITE_OTHER): Payer: Medicare Other | Admitting: Internal Medicine

## 2014-04-25 ENCOUNTER — Encounter: Payer: Self-pay | Admitting: Internal Medicine

## 2014-04-25 VITALS — BP 120/80 | HR 71 | Temp 98.1°F | Wt 156.0 lb

## 2014-04-25 DIAGNOSIS — F4329 Adjustment disorder with other symptoms: Secondary | ICD-10-CM

## 2014-04-25 DIAGNOSIS — F4321 Adjustment disorder with depressed mood: Secondary | ICD-10-CM | POA: Diagnosis not present

## 2014-04-25 DIAGNOSIS — Z634 Disappearance and death of family member: Principal | ICD-10-CM

## 2014-04-25 MED ORDER — SERTRALINE HCL 50 MG PO TABS: 50.0000 mg | ORAL_TABLET | Freq: Every day | ORAL | Status: DC

## 2014-04-25 MED ORDER — GABAPENTIN 600 MG PO TABS: 600.0000 mg | ORAL_TABLET | Freq: Every day | ORAL | Status: DC

## 2014-04-25 NOTE — Progress Notes (Signed)
   Subjective:    Patient ID: Heidi Carroll, female    DOB: [birth year], 73 y.o.   MRN: [id number]  HPI Doing better Had trouble with the cymbalta Has tolerated sertraline 50mg  daily Definitely better though not back to her normal Did spend time with some friends who have worked with her--didn't call the bereavement counselor  Ongoing neuropathic pain Needs to continue the gabapentin  Current Outpatient Prescriptions on File Prior to Visit  Medication Sig Dispense Refill  . aspirin 81 MG tablet Take 81 mg by mouth daily.        . calcium carbonate (OS-CAL) 600 MG TABS Take 600 mg by mouth daily.        Marland Kitchen. gabapentin (NEURONTIN) 600 MG tablet TAKE 1 TABLET BY MOUTH AT BEDTIME FOR NERVE PAIN  90 tablet  3  . sertraline (ZOLOFT) 50 MG tablet Take 1 tablet (50 mg total) by mouth daily.  30 tablet  1   No current facility-administered medications on file prior to visit.    Allergies  Allergen Reactions  . Cymbalta [Duloxetine Hcl] Nausea Only  . Penicillins     Past Medical History  Diagnosis Date  . Depression   . Neuropathy     idiopathic    Past Surgical History  Procedure Laterality Date  . Vaginal hysterectomy  1974  . Vaginal delivery  1963    X 1  . Foot surgery  1995    bilateral   . Spinal cord stimulator implant  12/1996    didn't keep    Family History  Problem Relation Age of Onset  . Cancer Father   . Diabetes Maternal Grandfather     History   Social History  . Marital Status: Married    Spouse Name: N/A    Number of Children: 1  . Years of Education: N/A   Occupational History  . Retired, [employer]([place])    Social History Main Topics  . Smoking status: Current Every Day Smoker -- 1.00 packs/day    Types: Cigarettes  . Smokeless tobacco: Never Used     Comment: . Info given on 1-800-QUIT NOW  . Alcohol Use: No  . Drug Use: No  . Sexual Activity: Not on file   Other Topics Concern  . Not on file   Social History Narrative   Husband died 10/14   Review of Systems Appetite is good Weight is down 2#--trying to walk regularly with dog Sleep is still off at times. If she awakens early, she needs to get up    Objective:   Physical Exam  Psychiatric:  Bright, with smiles Clearly looks more like her normal again Appropriate affect          Assessment & Plan:

## 2014-04-25 NOTE — Progress Notes (Signed)
Pre visit review using our clinic review tool, if applicable. No additional management support is needed unless otherwise documented below in the visit note. 

## 2014-04-25 NOTE — Assessment & Plan Note (Signed)
Vastly improved May be combination of time with friends and the med Will reevaluate the need for med at 6 month follow up Has started getting out more--visiting, etc. Plans to do volunteer work

## 2014-05-23 ENCOUNTER — Other Ambulatory Visit: Payer: Self-pay | Admitting: *Deleted

## 2014-05-23 MED ORDER — SERTRALINE HCL 50 MG PO TABS: 50.0000 mg | ORAL_TABLET | Freq: Every day | ORAL | Status: DC

## 2014-08-17 ENCOUNTER — Ambulatory Visit: Payer: Medicare Other

## 2014-08-23 ENCOUNTER — Ambulatory Visit (INDEPENDENT_AMBULATORY_CARE_PROVIDER_SITE_OTHER): Payer: Medicare Other

## 2014-08-23 DIAGNOSIS — Z23 Encounter for immunization: Secondary | ICD-10-CM

## 2014-11-03 ENCOUNTER — Encounter: Payer: Medicare Other | Admitting: Internal Medicine

## 2014-12-07 ENCOUNTER — Encounter: Payer: Medicare Other | Admitting: Internal Medicine

## 2014-12-22 ENCOUNTER — Ambulatory Visit (INDEPENDENT_AMBULATORY_CARE_PROVIDER_SITE_OTHER): Payer: Medicare Other | Admitting: Internal Medicine

## 2014-12-22 ENCOUNTER — Encounter: Payer: Self-pay | Admitting: Internal Medicine

## 2014-12-22 VITALS — BP 160/82 | HR 68 | Temp 98.2°F | Ht 62.25 in | Wt 160.0 lb

## 2014-12-22 DIAGNOSIS — F4321 Adjustment disorder with depressed mood: Secondary | ICD-10-CM | POA: Diagnosis not present

## 2014-12-22 DIAGNOSIS — Z634 Disappearance and death of family member: Secondary | ICD-10-CM

## 2014-12-22 DIAGNOSIS — G609 Hereditary and idiopathic neuropathy, unspecified: Secondary | ICD-10-CM

## 2014-12-22 DIAGNOSIS — Z0000 Encounter for general adult medical examination without abnormal findings: Secondary | ICD-10-CM | POA: Insufficient documentation

## 2014-12-22 DIAGNOSIS — F4329 Adjustment disorder with other symptoms: Secondary | ICD-10-CM

## 2014-12-22 DIAGNOSIS — Z23 Encounter for immunization: Secondary | ICD-10-CM

## 2014-12-22 DIAGNOSIS — Z7189 Other specified counseling: Secondary | ICD-10-CM | POA: Insufficient documentation

## 2014-12-22 LAB — CBC WITH DIFFERENTIAL/PLATELET
BASOS ABS: 0 10*3/uL (ref 0.0–0.1)
Basophils Relative: 0.4 % (ref 0.0–3.0)
Eosinophils Absolute: 0 10*3/uL (ref 0.0–0.7)
Eosinophils Relative: 0.6 % (ref 0.0–5.0)
HCT: 48 % — ABNORMAL HIGH (ref 36.0–46.0)
HEMOGLOBIN: 16.3 g/dL — AB (ref 12.0–15.0)
Lymphocytes Relative: 33.8 % (ref 12.0–46.0)
Lymphs Abs: 2.8 10*3/uL (ref 0.7–4.0)
MCHC: 34 g/dL (ref 30.0–36.0)
MCV: 95.5 fl (ref 78.0–100.0)
MONOS PCT: 5.5 % (ref 3.0–12.0)
Monocytes Absolute: 0.5 10*3/uL (ref 0.1–1.0)
NEUTROS ABS: 4.9 10*3/uL (ref 1.4–7.7)
Neutrophils Relative %: 59.7 % (ref 43.0–77.0)
PLATELETS: 200 10*3/uL (ref 150.0–400.0)
RBC: 5.02 Mil/uL (ref 3.87–5.11)
RDW: 13.7 % (ref 11.5–15.5)
WBC: 8.2 10*3/uL (ref 4.0–10.5)

## 2014-12-22 LAB — COMPREHENSIVE METABOLIC PANEL
ALBUMIN: 4.1 g/dL (ref 3.5–5.2)
ALT: 11 U/L (ref 0–35)
AST: 16 U/L (ref 0–37)
Alkaline Phosphatase: 99 U/L (ref 39–117)
BUN: 12 mg/dL (ref 6–23)
CO2: 30 meq/L (ref 19–32)
CREATININE: 0.71 mg/dL (ref 0.40–1.20)
Calcium: 9.4 mg/dL (ref 8.4–10.5)
Chloride: 105 mEq/L (ref 96–112)
GFR: 85.86 mL/min (ref >60.00)
Glucose, Bld: 90 mg/dL (ref 70–99)
Potassium: 4 mEq/L (ref 3.5–5.1)
SODIUM: 139 meq/L (ref 135–145)
Total Bilirubin: 0.5 mg/dL (ref 0.2–1.2)
Total Protein: 6.6 g/dL (ref 6.0–8.3)

## 2014-12-22 LAB — T4, FREE: Free T4: 0.92 ng/dL (ref 0.60–1.60)

## 2014-12-22 MED ORDER — GABAPENTIN 600 MG PO TABS: 600.0000 mg | ORAL_TABLET | Freq: Every day | ORAL | Status: DC

## 2014-12-22 MED ORDER — ZOSTER VACCINE LIVE 19400 UNT/0.65ML ~~LOC~~ SOLR: 0.6500 mL | Freq: Once | SUBCUTANEOUS | Status: DC

## 2014-12-22 NOTE — Assessment & Plan Note (Addendum)
I have personally reviewed the Medicare Annual Wellness questionnaire and have noted 1. The patient's medical and social history 2. Their use of alcohol, tobacco or illicit drugs 3. Their current medications and supplements 4. The patient's functional ability including ADL's, fall risks, home safety risks and hearing or visual             impairment. 5. Diet and physical activities 6. Evidence for depression or mood disorders  The patients weight, height, BMI and visual acuity have been recorded in the chart I have made referrals, counseling and provided education to the patient based review of the above and I have provided the pt with a written personalized care plan for preventive services.  I have provided you with a copy of your personalized plan for preventive services. Please take the time to review along with your updated medication list.  prevnar today Rx for zostavax Doesn't want mammogram or colon cancer screening No PAP due to age Discussed cigarette cessation Doesn't want DEXA Check labs

## 2014-12-22 NOTE — Addendum Note (Signed)
Addended by: Judd GaudierLEVENS, Taher Vannote M on: 12/22/2014 02:41 PM   Modules accepted: Orders

## 2014-12-22 NOTE — Patient Instructions (Signed)
Please cut the sertraline to 1/2 tab (25mg ) daily for the next 2-3 weeks. If you are doing well, you can go to every other day for another few weeks and then try stopping if you are still doing well.  DASH Eating Plan DASH stands for "Dietary Approaches to Stop Hypertension." The DASH eating plan is a healthy eating plan that has been shown to reduce high blood pressure (hypertension). Additional health benefits may include reducing the risk of type 2 diabetes mellitus, heart disease, and stroke. The DASH eating plan may also help with weight loss. WHAT DO I NEED TO KNOW ABOUT THE DASH EATING PLAN? For the DASH eating plan, you will follow these general guidelines:  Choose foods with a percent daily value for sodium of less than 5% (as listed on the food label).  Use salt-free seasonings or herbs instead of table salt or sea salt.  Check with your health care provider or pharmacist before using salt substitutes.  Eat lower-sodium products, often labeled as "lower sodium" or "no salt added."  Eat fresh foods.  Eat more vegetables, fruits, and low-fat dairy products.  Choose whole grains. Look for the word "whole" as the first word in the ingredient list.  Choose fish and skinless chicken or [place]key more often than red meat. Limit fish, poultry, and meat to 6 oz (170 g) each day.  Limit sweets, desserts, sugars, and sugary drinks.  Choose heart-healthy fats.  Limit cheese to 1 oz (28 g) per day.  Eat more home-cooked food and less restaurant, buffet, and fast food.  Limit fried foods.  Cook foods using methods other than frying.  Limit canned vegetables. If you do use them, rinse them well to decrease the sodium.  When eating at a restaurant, ask that your food be prepared with less salt, or no salt if possible. WHAT FOODS CAN I EAT? Seek help from a dietitian for individual calorie needs. Grains Whole grain or whole wheat bread. Brown rice. Whole grain or whole wheat pasta.  Quinoa, bulgur, and whole grain cereals. Low-sodium cereals. Corn or whole wheat flour tortillas. Whole grain cornbread. Whole grain crackers. Low-sodium crackers. Vegetables Fresh or frozen vegetables (raw, steamed, roasted, or grilled). Low-sodium or reduced-sodium tomato and vegetable juices. Low-sodium or reduced-sodium tomato sauce and paste. Low-sodium or reduced-sodium canned vegetables.  Fruits All fresh, canned (in natural juice), or frozen fruits. Meat and Other Protein Products Ground beef (85% or leaner), grass-fed beef, or beef trimmed of fat. Skinless chicken or [place]key. Ground chicken or [place]key. Pork trimmed of fat. All fish and seafood. Eggs. Dried beans, peas, or lentils. Unsalted nuts and seeds. Unsalted canned beans. Dairy Low-fat dairy products, such as skim or 1% milk, 2% or reduced-fat cheeses, low-fat ricotta or cottage cheese, or plain low-fat yogurt. Low-sodium or reduced-sodium cheeses. Fats and Oils Tub margarines without trans fats. Light or reduced-fat mayonnaise and salad dressings (reduced sodium). Avocado. Safflower, olive, or canola oils. Natural peanut or almond butter. Other Unsalted popcorn and pretzels. The items listed above may not be a complete list of recommended foods or beverages. Contact your dietitian for more options. WHAT FOODS ARE NOT RECOMMENDED? Grains White bread. White pasta. White rice. Refined cornbread. Bagels and croissants. Crackers that contain trans fat. Vegetables Creamed or fried vegetables. Vegetables in a cheese sauce. Regular canned vegetables. Regular canned tomato sauce and paste. Regular tomato and vegetable juices. Fruits Dried fruits. Canned fruit in light or heavy syrup. Fruit juice. Meat and Other Protein Products Fatty cuts  of meat. Ribs, chicken wings, bacon, sausage, bologna, salami, chitterlings, fatback, hot dogs, bratwurst, and packaged luncheon meats. Salted nuts and seeds. Canned beans with salt. Dairy Whole or 2%  milk, cream, half-and-half, and cream cheese. Whole-fat or sweetened yogurt. Full-fat cheeses or blue cheese. Nondairy creamers and whipped toppings. Processed cheese, cheese spreads, or cheese curds. Condiments Onion and garlic salt, seasoned salt, table salt, and sea salt. Canned and packaged gravies. Worcestershire sauce. Tartar sauce. Barbecue sauce. Teriyaki sauce. Soy sauce, including reduced sodium. Steak sauce. Fish sauce. Oyster sauce. Cocktail sauce. Horseradish. Ketchup and mustard. Meat flavorings and tenderizers. Bouillon cubes. Hot sauce. Tabasco sauce. Marinades. Taco seasonings. Relishes. Fats and Oils Butter, stick margarine, lard, shortening, ghee, and bacon fat. Coconut, palm kernel, or palm oils. Regular salad dressings. Other Pickles and olives. Salted popcorn and pretzels. The items listed above may not be a complete list of foods and beverages to avoid. Contact your dietitian for more information. WHERE CAN I FIND MORE INFORMATION? National Heart, Lung, and Blood Institute: travelstabloid.com Document Released: 10/02/2011 Document Revised: 02/27/2014 Document Reviewed: 08/17/2013 [facility] Patient Information 2015 [place], [place]. This information is not intended to replace advice given to you by your health care provider. Make sure you discuss any questions you have with your health care provider.

## 2014-12-22 NOTE — Assessment & Plan Note (Signed)
Doing better now Will wean off the sertraline

## 2014-12-22 NOTE — Progress Notes (Signed)
Subjective:    Patient ID: Heidi Carroll, female    DOB: 02-14-1941, 74 y.o.   MRN: [id number]  HPI Here for initial Medicare wellness and follow up Reviewed form and advanced directives No other doctors except eye doctor. Sees dentist regularly. May need extractions at some point No falls Mood is now good Independent with instrumental ADLs No alcohol Still smokes-- almost a pack a day. Not ready to quit yet. Hopes to eventually. Vision and hearing are fine No apparent cognitive changes  She feels her mood is better Getting out--walking dog Socially involved and also helping care for elderly neighbors Ready to try to get off the sertraline  Still has the neuropathy Only bothers her if she is on her feet a lot  Current Outpatient Prescriptions on File Prior to Visit  Medication Sig Dispense Refill  . aspirin 81 MG tablet Take 81 mg by mouth daily.      . calcium carbonate (OS-CAL) 600 MG TABS Take 600 mg by mouth daily.      Marland Kitchen. gabapentin (NEURONTIN) 600 MG tablet Take 1 tablet (600 mg total) by mouth at bedtime. 90 tablet 3  . sertraline (ZOLOFT) 50 MG tablet Take 1 tablet (50 mg total) by mouth daily. 90 tablet 2   No current facility-administered medications on file prior to visit.    Allergies  Allergen Reactions  . Cymbalta [Duloxetine Hcl] Nausea Only  . Penicillins     Past Medical History  Diagnosis Date  . Depression   . Neuropathy     idiopathic    Past Surgical History  Procedure Laterality Date  . Vaginal hysterectomy  1974  . Vaginal delivery  1963    X 1  . Foot surgery  1995    bilateral   . Spinal cord stimulator implant  12/1996    didn't keep    Family History  Problem Relation Age of Onset  . Cancer Father   . Diabetes Maternal Grandfather     History   Social History  . Marital Status: Widowed    Spouse Name: N/A  . Number of Children: 1  . Years of Education: N/A   Occupational History  . Retired, [employer]([place])      Social History Main Topics  . Smoking status: Current Every Day Smoker -- 1.00 packs/day    Types: Cigarettes  . Smokeless tobacco: Never Used     Comment: . Info given on 1-800-QUIT NOW  . Alcohol Use: No  . Drug Use: No  . Sexual Activity: Not on file   Other Topics Concern  . Not on file   Social History Narrative   Husband died 10/14      Has living will   Has health care POA--- [name], friend   Requests DNR--- form done 12/22/14   No tube feedings if cognitively unaware   Review of Systems Appetite is good Weight up a few pounds --winter weight No skin problems Bowels are fine No urinary problems    Objective:   Physical Exam  Constitutional: She is oriented to person, place, and time. She appears well-developed and well-nourished. No distress.  HENT:  Mouth/Throat: Oropharynx is clear and moist. No oropharyngeal exudate.  Neck: Normal range of motion. Neck supple. No thyromegaly present.  Cardiovascular: Normal rate, regular rhythm, normal heart sounds and intact distal pulses.  Exam reveals no gallop.   No murmur heard. Pulmonary/Chest: Effort normal and breath sounds normal. No respiratory distress. She has  no wheezes. She has no rales.  Abdominal: Soft. There is no tenderness.  Musculoskeletal: She exhibits no edema or tenderness.  Lymphadenopathy:    She has no cervical adenopathy.  Neurological: She is alert and oriented to person, place, and time.  President-- "Obama, Bush, CLinton" [phone] D-l-r-o-w Recall 3/3  Skin: No rash noted. No erythema.  Psychiatric: She has a normal mood and affect. Her behavior is normal.          Assessment & Plan:

## 2014-12-22 NOTE — Assessment & Plan Note (Signed)
See social history Has DNR --done today

## 2014-12-22 NOTE — Assessment & Plan Note (Signed)
Does well with the gabapentin Will check labs

## 2015-01-12 ENCOUNTER — Telehealth: Payer: Self-pay | Admitting: Internal Medicine

## 2015-01-12 NOTE — Telephone Encounter (Signed)
Form placed in Dr Letvak's inbox.  

## 2015-01-12 NOTE — Telephone Encounter (Signed)
Pt dropped off handicap permit form. Please fill out and mail back to pt. She has included a SASE.  Placing on Dee's desk.

## 2015-01-15 NOTE — Telephone Encounter (Signed)
Pt notified form was complete and mailed.

## 2015-01-15 NOTE — Telephone Encounter (Signed)
Form done No charge 

## 2015-06-25 ENCOUNTER — Telehealth: Payer: Self-pay

## 2015-06-25 NOTE — Telephone Encounter (Signed)
Spoke with patient and reminded her of being due for a Mammogram. Patient states that she will schedule one at [facility].

## 2015-12-31 ENCOUNTER — Encounter: Payer: Self-pay | Admitting: Internal Medicine

## 2015-12-31 ENCOUNTER — Ambulatory Visit (INDEPENDENT_AMBULATORY_CARE_PROVIDER_SITE_OTHER): Payer: Medicare Other | Admitting: Internal Medicine

## 2015-12-31 VITALS — BP 132/70 | HR 61 | Temp 98.1°F | Ht 62.0 in | Wt 157.0 lb

## 2015-12-31 DIAGNOSIS — Z0000 Encounter for general adult medical examination without abnormal findings: Secondary | ICD-10-CM

## 2015-12-31 DIAGNOSIS — G609 Hereditary and idiopathic neuropathy, unspecified: Secondary | ICD-10-CM | POA: Diagnosis not present

## 2015-12-31 DIAGNOSIS — Z720 Tobacco use: Secondary | ICD-10-CM | POA: Diagnosis not present

## 2015-12-31 DIAGNOSIS — Z7189 Other specified counseling: Secondary | ICD-10-CM | POA: Diagnosis not present

## 2015-12-31 DIAGNOSIS — F17210 Nicotine dependence, cigarettes, uncomplicated: Secondary | ICD-10-CM

## 2015-12-31 DIAGNOSIS — F17200 Nicotine dependence, unspecified, uncomplicated: Secondary | ICD-10-CM | POA: Insufficient documentation

## 2015-12-31 MED ORDER — TETANUS-DIPHTHERIA TOXOIDS TD 5-2 LFU IM INJ: 0.5000 mL | INJECTION | Freq: Once | INTRAMUSCULAR | Status: DC

## 2015-12-31 NOTE — Assessment & Plan Note (Signed)
I have personally reviewed the Medicare Annual Wellness questionnaire and have noted 1. The patient's medical and social history 2. Their use of alcohol, tobacco or illicit drugs 3. Their current medications and supplements 4. The patient's functional ability including ADL's, fall risks, home safety risks and hearing or visual             impairment. 5. Diet and physical activities 6. Evidence for depression or mood disorders  The patients weight, height, BMI and visual acuity have been recorded in the chart I have made referrals, counseling and provided education to the patient based review of the above and I have provided the pt with a written personalized care plan for preventive services.  I have provided you with a copy of your personalized plan for preventive services. Please take the time to review along with your updated medication list.  Advised flu vaccine in the fall--missed it last year She prefers no cancer screening or DEXA Rx for Td vaccine

## 2015-12-31 NOTE — Assessment & Plan Note (Signed)
See social history °Has DNR °

## 2015-12-31 NOTE — Progress Notes (Signed)
Pre visit review using our clinic review tool, if applicable. No additional management support is needed unless otherwise documented below in the visit note. 

## 2015-12-31 NOTE — Progress Notes (Signed)
Subjective:    Patient ID: Heidi Carroll, female    DOB: 1941-09-09, 75 y.o.   MRN: [id number]  HPI Here for medicare wellness and follow up of chronic medical conditions Reviewed form and advanced directives Reviewed other doctors-- none Still smokes No alcohol Tries to walk daily and occasionally uses exercise bike Vision and hearing are okay No falls Independent in instrumental ADLs Feels her memory is fine  Still has some bad days thinking about her husband Not doing much Has animals to care for---helping out with neighbors Does go to church  No regular depression but intermittent anhedonia She thinks things are pretty good  Same nerve pain in feet Better when she is not as active---worse if she is up on her feet all day Not as bad as in past  No regular cough Some sinus drainage No SOB Still not ready to stop smoking--- pre contemplative   Current Outpatient Prescriptions on File Prior to Visit  Medication Sig Dispense Refill  . aspirin 81 MG tablet Take 81 mg by mouth daily.      . calcium carbonate (OS-CAL) 600 MG TABS Take 600 mg by mouth daily.      Marland Kitchen gabapentin (NEURONTIN) 600 MG tablet Take 1 tablet (600 mg total) by mouth at bedtime. 90 tablet 3   No current facility-administered medications on file prior to visit.    Allergies  Allergen Reactions  . Cymbalta [Duloxetine Hcl] Nausea Only  . Penicillins     Past Medical History  Diagnosis Date  . Depression   . Neuropathy (HCC)     idiopathic    Past Surgical History  Procedure Laterality Date  . Vaginal hysterectomy  1974  . Vaginal delivery  1963    X 1  . Foot surgery  1995    bilateral   . Spinal cord stimulator implant  12/1996    didn't keep    Family History  Problem Relation Age of Onset  . Cancer Father   . Diabetes Maternal Grandfather     Social History   Social History  . Marital Status: Widowed    Spouse Name: N/A  . Number of Children: 1  . Years of Education:  N/A   Occupational History  . Retired, [employer])    Social History Main Topics  . Smoking status: Current Every Day Smoker -- 1.00 packs/day    Types: Cigarettes  . Smokeless tobacco: Never Used     Comment: . Info given on 1-800-QUIT NOW  . Alcohol Use: No  . Drug Use: No  . Sexual Activity: Not on file   Other Topics Concern  . Not on file   Social History Narrative   Husband died 08/31/2023      Has living will   Has health care POA--- Chaya Jan, friend   Requests DNR--- form done 12/22/14   No tube feedings if cognitively unaware   Review of Systems Full dentures-- can only wear them for short periods of time (they gag her) Wears seat belt Sleeping better Appetite is very good Weight is down 3# Bowels are fine. No blood in stool. No abdominal pain Voids okay--- rare urge incontinence (if she waits too long) No back or joint pains No rash or suspicious lesions    Objective:   Physical Exam  Constitutional: She is oriented to person, place, and time. She appears well-developed and well-nourished. No distress.  HENT:  Mouth/Throat: Oropharynx is clear and moist. No oropharyngeal exudate.  Full dentures in  Neck: Normal range of motion. Neck supple. No thyromegaly present.  Cardiovascular: Normal rate, regular rhythm, normal heart sounds and intact distal pulses.  Exam reveals no gallop.   No murmur heard. Pulmonary/Chest: Effort normal and breath sounds normal. No respiratory distress. She has no wheezes. She has no rales.  Abdominal: Soft. There is no tenderness.  Musculoskeletal: She exhibits no edema or tenderness.  Lymphadenopathy:    She has no cervical adenopathy.  Neurological: She is alert and oriented to person, place, and time.  President--- "Garnet Koyanagionald Trump, OBama, Bush" [phone] D-l-r-o-w Recall 3/3  Skin: No rash noted. No erythema.  Psychiatric: She has a normal mood and affect. Her behavior is normal.          Assessment  & Plan:

## 2015-12-31 NOTE — Assessment & Plan Note (Signed)
Discussed Not ready to stop

## 2015-12-31 NOTE — Assessment & Plan Note (Signed)
Actually seems some better now On the gabapentin still

## 2016-01-04 ENCOUNTER — Telehealth: Payer: Self-pay | Admitting: *Deleted

## 2016-01-04 NOTE — Telephone Encounter (Signed)
Pt contacted office and states she was recently seen and [facility] Pt states she was advised by [facility] they do not have that particular dose, but the booster instead and wanted to know if that would be ok. If so, new Rx is needing and pt is requesting a call back so that she will know to return to pharmacy

## 2016-01-04 NOTE — Telephone Encounter (Signed)
Not Boostrix  Td is what she needs Call them order

## 2016-01-04 NOTE — Telephone Encounter (Signed)
[facility]bil at [facility]. She said they have Boostrix and a generic Tetanus/Diptheria by [employer]

## 2016-01-04 NOTE — Telephone Encounter (Signed)
Please check with the pharmacy and put in order for the Td that they have Let me know what that is so I can change my ordering

## 2016-01-04 NOTE — Telephone Encounter (Signed)
She can get the [employer] Td. I spoke to pt. Advised her which one. I also called [facility] and spoke to AdamsStephanie. She will make sure patient gets the right one

## 2016-02-05 ENCOUNTER — Other Ambulatory Visit: Payer: Self-pay | Admitting: Internal Medicine

## 2016-11-11 ENCOUNTER — Inpatient Hospital Stay
Admission: EM | Admit: 2016-11-11 | Discharge: 2016-11-13 | DRG: 310 | Disposition: A | Discharge status: Home / Self Care | Payer: Medicare Other | Attending: Internal Medicine | Admitting: Internal Medicine

## 2016-11-11 ENCOUNTER — Encounter: Payer: Self-pay | Admitting: Emergency Medicine

## 2016-11-11 ENCOUNTER — Emergency Department: Payer: Medicare Other

## 2016-11-11 ENCOUNTER — Ambulatory Visit: Payer: Self-pay | Admitting: Family Medicine

## 2016-11-11 ENCOUNTER — Telehealth: Payer: Self-pay | Admitting: Internal Medicine

## 2016-11-11 DIAGNOSIS — R55 Syncope and collapse: Secondary | ICD-10-CM

## 2016-11-11 DIAGNOSIS — Z7982 Long term (current) use of aspirin: Secondary | ICD-10-CM

## 2016-11-11 DIAGNOSIS — Z888 Allergy status to other drugs, medicaments and biological substances status: Secondary | ICD-10-CM

## 2016-11-11 DIAGNOSIS — I959 Hypotension, unspecified: Secondary | ICD-10-CM

## 2016-11-11 DIAGNOSIS — R000 Tachycardia, unspecified: Secondary | ICD-10-CM

## 2016-11-11 DIAGNOSIS — Z880 Allergy status to penicillin: Secondary | ICD-10-CM | POA: Diagnosis not present

## 2016-11-11 DIAGNOSIS — F329 Major depressive disorder, single episode, unspecified: Secondary | ICD-10-CM | POA: Diagnosis present

## 2016-11-11 DIAGNOSIS — I4891 Unspecified atrial fibrillation: Secondary | ICD-10-CM | POA: Diagnosis present

## 2016-11-11 DIAGNOSIS — Z809 Family history of malignant neoplasm, unspecified: Secondary | ICD-10-CM

## 2016-11-11 DIAGNOSIS — Z833 Family history of diabetes mellitus: Secondary | ICD-10-CM

## 2016-11-11 DIAGNOSIS — Z23 Encounter for immunization: Secondary | ICD-10-CM | POA: Diagnosis not present

## 2016-11-11 DIAGNOSIS — R001 Bradycardia, unspecified: Secondary | ICD-10-CM | POA: Diagnosis not present

## 2016-11-11 DIAGNOSIS — G609 Hereditary and idiopathic neuropathy, unspecified: Secondary | ICD-10-CM | POA: Diagnosis present

## 2016-11-11 DIAGNOSIS — I214 Non-ST elevation (NSTEMI) myocardial infarction: Secondary | ICD-10-CM | POA: Diagnosis not present

## 2016-11-11 DIAGNOSIS — I4892 Unspecified atrial flutter: Principal | ICD-10-CM | POA: Diagnosis present

## 2016-11-11 DIAGNOSIS — F17210 Nicotine dependence, cigarettes, uncomplicated: Secondary | ICD-10-CM | POA: Diagnosis present

## 2016-11-11 DIAGNOSIS — Z90710 Acquired absence of both cervix and uterus: Secondary | ICD-10-CM

## 2016-11-11 DIAGNOSIS — R002 Palpitations: Secondary | ICD-10-CM | POA: Diagnosis not present

## 2016-11-11 DIAGNOSIS — R531 Weakness: Secondary | ICD-10-CM | POA: Diagnosis not present

## 2016-11-11 DIAGNOSIS — R079 Chest pain, unspecified: Secondary | ICD-10-CM | POA: Diagnosis not present

## 2016-11-11 DIAGNOSIS — R6889 Other general symptoms and signs: Secondary | ICD-10-CM | POA: Diagnosis not present

## 2016-11-11 LAB — COMPREHENSIVE METABOLIC PANEL
ALBUMIN: 3.5 g/dL (ref 3.5–5.0)
ALK PHOS: 58 U/L (ref 38–126)
ALT: 12 U/L — ABNORMAL LOW (ref 14–54)
ANION GAP: 8 (ref 5–15)
AST: 28 U/L (ref 15–41)
BUN: 27 mg/dL — ABNORMAL HIGH (ref 6–20)
CHLORIDE: 107 mmol/L (ref 101–111)
CO2: 24 mmol/L (ref 22–32)
Calcium: 9 mg/dL (ref 8.9–10.3)
Creatinine, Ser: 0.71 mg/dL (ref 0.44–1.00)
GFR calc Af Amer: >60 mL/min (ref >60)
GFR calc non Af Amer: >60 mL/min (ref >60)
GLUCOSE: 110 mg/dL — AB (ref 65–99)
POTASSIUM: 4.6 mmol/L (ref 3.5–5.1)
SODIUM: 139 mmol/L (ref 135–145)
Total Bilirubin: 1.1 mg/dL (ref 0.3–1.2)
Total Protein: 6.1 g/dL — ABNORMAL LOW (ref 6.5–8.1)

## 2016-11-11 LAB — BASIC METABOLIC PANEL
Anion gap: 8 (ref 5–15)
BUN: 21 mg/dL — ABNORMAL HIGH (ref 6–20)
CHLORIDE: 102 mmol/L (ref 101–111)
CO2: 28 mmol/L (ref 22–32)
CREATININE: 0.94 mg/dL (ref 0.44–1.00)
Calcium: 9.5 mg/dL (ref 8.9–10.3)
GFR calc non Af Amer: 58 mL/min — ABNORMAL LOW (ref >60)
Glucose, Bld: 123 mg/dL — ABNORMAL HIGH (ref 65–99)
POTASSIUM: 5.1 mmol/L (ref 3.5–5.1)
SODIUM: 138 mmol/L (ref 135–145)

## 2016-11-11 LAB — HEPATIC FUNCTION PANEL
ALT: 14 U/L (ref 14–54)
AST: 24 U/L (ref 15–41)
Albumin: 3.9 g/dL (ref 3.5–5.0)
Alkaline Phosphatase: 63 U/L (ref 38–126)
BILIRUBIN DIRECT: 0.1 mg/dL (ref 0.1–0.5)
BILIRUBIN INDIRECT: 0.4 mg/dL (ref 0.3–0.9)
Total Bilirubin: 0.5 mg/dL (ref 0.3–1.2)
Total Protein: 6.6 g/dL (ref 6.5–8.1)

## 2016-11-11 LAB — CBC
HEMATOCRIT: 53.7 % — AB (ref 35.0–47.0)
Hemoglobin: 18 g/dL — ABNORMAL HIGH (ref 12.0–16.0)
MCH: 33.1 pg (ref 26.0–34.0)
MCHC: 33.6 g/dL (ref 32.0–36.0)
MCV: 98.6 fL (ref 80.0–100.0)
PLATELETS: 236 10*3/uL (ref 150–440)
RBC: 5.45 MIL/uL — AB (ref 3.80–5.20)
RDW: 12.8 % (ref 11.5–14.5)
WBC: 11 10*3/uL (ref 3.6–11.0)

## 2016-11-11 LAB — TROPONIN I: Troponin I: 0.27 ng/mL (ref <0.03)

## 2016-11-11 LAB — MRSA PCR SCREENING: MRSA BY PCR: NEGATIVE

## 2016-11-11 LAB — APTT: aPTT: 39 seconds — ABNORMAL HIGH (ref 24–36)

## 2016-11-11 LAB — PROTIME-INR
INR: 0.97
PROTHROMBIN TIME: 12.9 s (ref 11.4–15.2)

## 2016-11-11 LAB — TSH: TSH: 1.677 u[IU]/mL (ref 0.350–4.500)

## 2016-11-11 MED ORDER — METOPROLOL TARTRATE 5 MG/5ML IV SOLN: 10.0000 mg | Freq: Once | INTRAVENOUS | Status: DC

## 2016-11-11 MED ORDER — METOPROLOL TARTRATE 5 MG/5ML IV SOLN
10.0000 mg | Freq: Once | INTRAVENOUS | Status: AC
Start: 2016-11-11 — End: 2016-11-11
  Administered 2016-11-11: 5 mg via INTRAVENOUS
  Filled 2016-11-11: qty 10

## 2016-11-11 MED ORDER — ASPIRIN 81 MG PO CHEW
81.0000 mg | CHEWABLE_TABLET | Freq: Every day | ORAL | Status: DC
  Administered 2016-11-12 – 2016-11-13 (×2): 81 mg via ORAL
  Filled 2016-11-11 (×2): qty 1

## 2016-11-11 MED ORDER — ONDANSETRON HCL 4 MG/2ML IJ SOLN: 4.0000 mg | Freq: Four times a day (QID) | INTRAMUSCULAR | Status: DC | PRN

## 2016-11-11 MED ORDER — ACETAMINOPHEN 325 MG PO TABS: 650.0000 mg | ORAL_TABLET | ORAL | Status: DC | PRN

## 2016-11-11 MED ORDER — INFLUENZA VAC SPLIT QUAD 0.5 ML IM SUSY
0.5000 mL | PREFILLED_SYRINGE | INTRAMUSCULAR | Status: AC
  Administered 2016-11-12: 0.5 mL via INTRAMUSCULAR
  Filled 2016-11-11: qty 0.5

## 2016-11-11 MED ORDER — ENOXAPARIN SODIUM 40 MG/0.4ML ~~LOC~~ SOLN
40.0000 mg | SUBCUTANEOUS | Status: DC
  Administered 2016-11-11: 40 mg via SUBCUTANEOUS
  Filled 2016-11-11: qty 0.4

## 2016-11-11 MED ORDER — ASPIRIN 81 MG PO CHEW
324.0000 mg | CHEWABLE_TABLET | Freq: Once | ORAL | Status: AC
  Administered 2016-11-11: 324 mg via ORAL
  Filled 2016-11-11: qty 4

## 2016-11-11 MED ORDER — METOPROLOL TARTRATE 5 MG/5ML IV SOLN
5.0000 mg | Freq: Once | INTRAVENOUS | Status: DC
  Filled 2016-11-11: qty 5

## 2016-11-11 NOTE — Telephone Encounter (Signed)
Please check on her There has been a stomach bug going around. If she can take in fluids and her heart seems to settle down, she can probably just stay at home (the bug hasn't been lasting long). If she has ongoing symptoms or really doesn't feel well, will need to be seen

## 2016-11-11 NOTE — ED Notes (Signed)
MD made aware of Troponin 0.27 per Lab

## 2016-11-11 NOTE — Telephone Encounter (Signed)
Looks like she went to the ER

## 2016-11-11 NOTE — Consult Note (Signed)
[facility] Cardiology  CARDIOLOGY CONSULT NOTE  Patient ID: Heidi Carroll MRN: [id number] DOB/AGE: [birth year] 76 y.o.  Admit date: 11/11/2016 Referring Physician Sharma Covert Primary Physician [facility] Primary Cardiologist  Reason for ConsultationAtrial flutter  HPI: 76 year old female referred for evaluation of atrial flutter. Patient denies significant past medical or cardiac history. She was in her usual state of health until last evening, when she noted per dictations and racing heart. The patient went to bed, awoke today with persistent operative dictations and racing heart. EMS was called, is brought to [facility] emergency room where she was noted to be tachycardic heart rate of 200 bpm, with ECG system with atrial flutter and one-to-one conduction. The patient was given 10 mg of intravenous Lopressor, and approximately an hour later remained in atrial flutter with 2 to one conduction at a rate of 120 bpm. Eventually, tachycardia resolved, patient was in predominant junctional rhythm with intermittent sinus beats, with heart rates as low as 30 bpm, currently 50 bpm. Patient denies chest pain or shortness of breath. The pressure currently is 90/60.  Review of systems complete and found to be negative unless listed above     Past Medical History:  Diagnosis Date  . Depression   . Neuropathy (HCC)    idiopathic    Past Surgical History:  Procedure Laterality Date  . FOOT SURGERY  1995   bilateral   . SPINAL CORD STIMULATOR IMPLANT  12/1996   didn't keep  . VAGINAL DELIVERY  1963   X 1  . VAGINAL HYSTERECTOMY  1974     (Not in a hospital admission) Social History   Social History  . Marital status: Widowed    Spouse name: N/A  . Number of children: 1  . Years of education: N/A   Occupational History  . Retired, [employer])    Social History Main Topics  . Smoking status: Current Every Day Smoker    Packs/day: 1.00    Types: Cigarettes  . Smokeless tobacco: Never Used   Comment: . Info given on 1-800-QUIT NOW  . Alcohol use No  . Drug use: No  . Sexual activity: Not on file   Other Topics Concern  . Not on file   Social History Narrative   Husband died 2023/08/25      Has living will   Has health care POA--- Chaya Jan, friend   Requests DNR--- form done 12/22/14   No tube feedings if cognitively unaware    Family History  Problem Relation Age of Onset  . Cancer Father   . Diabetes Maternal Grandfather       Review of systems complete and found to be negative unless listed above      PHYSICAL EXAM  General: Well developed, well nourished, in no acute distress HEENT:  Normocephalic and atramatic Neck:  No JVD.  Lungs: Clear bilaterally to auscultation and percussion. Heart: HRRR . Normal S1 and S2 without gallops or murmurs.  Abdomen: Bowel sounds are positive, abdomen soft and non-tender  Msk:  Back normal, normal gait. Normal strength and tone for age. Extremities: No clubbing, cyanosis or edema.   Neuro: Alert and oriented X 3. Psych:  Good affect, responds appropriately  Labs:   Lab Results  Component Value Date   WBC 11.0 11/11/2016   HGB 18.0 (H) 11/11/2016   HCT 53.7 (H) 11/11/2016   MCV 98.6 11/11/2016   PLT 236 11/11/2016    Recent Labs Lab 11/11/16 1126 11/11/16 1148  NA 138  --  K 5.1  --   CL 102  --   CO2 28  --   BUN 21*  --   CREATININE 0.94  --   CALCIUM 9.5  --   PROT  --  6.6  BILITOT  --  0.5  ALKPHOS  --  63  ALT  --  14  AST  --  24  GLUCOSE 123*  --    Lab Results  Component Value Date   TROPONINI 0.27 (HH) 11/11/2016   No results found for: CHOL No results found for: HDL No results found for: LDLCALC No results found for: TRIG No results found for: CHOLHDL No results found for: LDLDIRECT    Radiology: Dg Chest 2 View  Result Date: 11/11/2016 CLINICAL DATA:  Chest pain EXAM: CHEST  2 VIEW COMPARISON:  None. FINDINGS: Normal heart size. Aortic atherosclerosis. Otherwise normal  mediastinal contour. No pneumothorax. No pleural effusion. Hyperinflated lungs. No pulmonary edema. No acute consolidative airspace disease. IMPRESSION: 1. No acute cardiopulmonary disease. 2. Hyperinflated lungs, suggesting COPD. 3. Aortic atherosclerosis. Electronically Signed   By: Delbert PhenixJason A Poff M.D.   On: 11/11/2016 12:16    EKG: Atrial flutter with 1 to 1 and 2 to 1conduction conduction  ASSESSMENT AND PLAN:   1. Paroxysmal atrial flutter, converted to junctional rhythm with intermittent sinus beats, and appears clinically and hemodynamically stable, without chest pain or shortness of breath 2. Borderline elevated troponin, in the setting of her flutter with rapid ventricular rate, likely demand supply ischemia, in the absence of chest pain  Recommendations  1. Agree with overall current therapy 2. Transcutaneous pacer pads for now. No indication for transvenous for a pacemaker at this time. 3. Trend troponins 4. 2-D echocardiogram 5. If patient remains bradycardic, need to consider permanent pacemaker implantation  Signed: Marcina Millardlexander Westyn Keatley MD,PhD, [facility] 11/11/2016, 5:02 PM

## 2016-11-11 NOTE — Telephone Encounter (Signed)
[facility] - Client TELEPHONE ADVICE RECORD [facility] Patient Name: Heidi Carroll DOB: Mar 14, 1941 Initial Comment Caller states weak, feels like can't lift both arms; heart racing; 4x diarrhea this morning; sudden weakness 11:30 last night; URG PER CHARGE Nurse Assessment Nurse: Kizzie BaneHughes, RN, Zella Ballobin Date/Time (Eastern Time): 11/11/2016 9:00:04 AM Confirm and document reason for call. If symptomatic, describe symptoms. ---Caller states that she is weak and feels like can't lift both arms; states that she feels like her heart is racing; 4 x diarrhea this morning; sudden weakness since 11:30 last night; Doesn't feel like she's running a fever. Does the patient have any new or worsening symptoms? ---Yes Will a triage be completed? ---Yes Related visit to physician within the last 2 weeks? ---No Does the PT have any chronic conditions? (i.e. diabetes, asthma, etc.) ---Yes List chronic conditions. ---idiopathic neuropathy Is this a behavioral health or substance abuse call? ---No Nurse: Kizzie BaneHughes, RN, Zella Ballobin Date/Time (Eastern Time): 11/11/2016 9:28:13 AM Confirm and document reason for call. If symptomatic, describe symptoms. ---Caller states that her HR is 127 Does the patient have any new or worsening symptoms? ---Yes Will a triage be completed? ---Yes Related visit to physician within the last 2 weeks? ---No Does the PT have any chronic conditions? (i.e. diabetes, asthma, etc.) ---Unknown Is this a behavioral health or substance abuse call? ---No Guidelines Guideline Title Affirmed Question Affirmed Notes Weakness (Generalized) and Fatigue [1] MODERATE weakness (i.e., interferes with work, school, normal activities) AND [2] cause unknown (Exceptions: weakness with acute minor illness, or weakness from poor fluid intake) Heart Rate and Heartbeat Questions Palpitations (all triage questions negative) Final Disposition User Home Care  Kizzie BaneHughes, RN, Robin Referrals REFERRED TO PCP OFFICE PLEASE NOTE: All timestamps contained within this report are represented as [place] Standard Time. CONFIDENTIALTY NOTICE: This fax transmission is intended only for the addressee. It contains information that is legally privileged, confidential or otherwise protected from use or disclosure. If you are not the intended recipient, you are strictly prohibited from reviewing, disclosing, copying using or disseminating any of this information or taking any action in reliance on or regarding this information. If you have received this fax in error, please notify us immediately by telephone so that we can arrange for its return to us. Phone: [phone], Toll-Free: [phone], Fax: [phone] Page: 2 of 2 Call Id: [id number] Disagree/Comply: Comply Disagree/Comply: Comply

## 2016-11-11 NOTE — H&P (Signed)
[facility]     ASSESSMENT/PLAN   76 yo female presents with palpitations, near syncope with junctional bradycardia.    CARDIOVASCULAR A: Initial tachycardia, now with junctional bradycardia with pulse as low as 29 with associated dizziness.  P:  Cardiology following.  --Hold meds, monitor in stepdown unit.     NEUROLOGIC A:  Dizziness/ near syncope.  P:   As above.   Tobacco abuse: discussed smoking cessation.   ---------------------------------------  ---------------------------------------   Name: Heidi SoxSallye K Carroll MRN: [id number] DOB: February 17, 1941    ADMISSION DATE:  11/11/2016  CHIEF COMPLAINT:  dizziness   HISTORY OF PRESENT ILLNESS:    Heidi Carroll is a 76 y.o. female with active tobacco abuse but otherwise healthy presenting with palpitations area did the patient reports thatlast night when going to bed, she developed central palpitations described as "racing heart rate." This was associated with generalized weakness. She denies any chest pain, shortness of breath, lightheadedness or syncope but did have significant difficulty due to energy decrease walking her dog this morning. No recent changes in medications, no personal or family history of blood clots, denies cocaine.  On arrival to the emergency department the patient has a heart rate in the 210s, with a blood pressure in the 90s, with improvement of her heart rate into the 120s and normalization of blood pressure without any intervention. Later her HR dropped into the 40's and went as low as 29 with associated dizziness.   PAST MEDICAL HISTORY :  Past Medical History:  Diagnosis Date  . Depression   . Neuropathy (HCC)    idiopathic   Past Surgical History:  Procedure Laterality Date  . FOOT SURGERY  1995   bilateral   . SPINAL CORD STIMULATOR IMPLANT  12/1996   didn't keep  . VAGINAL DELIVERY  1963   X 1  . VAGINAL HYSTERECTOMY  1974   Prior to Admission  medications   Medication Sig Start Date End Date Taking? Authorizing Provider  aspirin 81 MG tablet Take 81 mg by mouth daily.     Yes Historical Provider, MD  calcium carbonate (OS-CAL) 600 MG TABS Take 600 mg by mouth daily.     Yes Historical Provider, MD  gabapentin (NEURONTIN) 600 MG tablet TAKE ONE TABLET BY MOUTH AT BEDTIME 02/05/16  Yes Karie Schwalbeichard I Letvak, MD   Allergies  Allergen Reactions  . Cymbalta [Duloxetine Hcl] Nausea Only  . Penicillins     Has patient had a PCN reaction causing immediate rash, facial/tongue/throat swelling, SOB or lightheadedness with hypotension yes Has patient had a PCN reaction causing severe rash involving mucus membranes or skin necrosis: yes Has patient had a PCN reaction that required hospitalization no Has patient had a PCN reaction occurring within the last 10 years: no If all of the above answers are "NO", then may proceed with Cephalosporin use.    FAMILY HISTORY:  Family History  Problem Relation Age of Onset  . Cancer Father   . Diabetes Maternal Grandfather    SOCIAL HISTORY:  reports that she has been smoking Cigarettes.  She has been smoking about 1.00 pack per day. She has never used smokeless tobacco. She reports that she does not drink alcohol or use drugs.  REVIEW OF SYSTEMS:   Constitutional: Feels well. Cardiovascular: No chest pain.  Pulmonary: Denies dyspnea.   The remainder of systems were reviewed and were found to be negative other than what is documented in the HPI.  VITAL SIGNS: Temp:  [97.9 F (36.6 C)] 97.9 F (36.6 C) (01/16 1114) Pulse Rate:  [29-128] 43 (01/16 1400) Resp:  [13-24] 20 (01/16 1400) BP: (91-121)/(57-75) 92/57 (01/16 1400) SpO2:  [94 %-97 %] 96 % (01/16 1400) HEMODYNAMICS:   VENTILATOR SETTINGS:   INTAKE / OUTPUT: No intake or output data in the 24 hours ending 11/11/16 1425  Physical Examination:   VS: BP (!) 92/57   Pulse (!) 43   Temp 97.9 F (36.6 C) (Oral)   Resp 20   SpO2 96%    General Appearance: No distress  Neuro:without focal findings, mental status normal,. HEENT: PERRLA, EOM intact, no ptosis, no other lesions noticed;  Pulmonary: normal breath sounds., diaphragmatic excursion normal. CardiovascularNormal S1,S2.  No m/r/g. Huston Foley.     Abdomen: Benign, Soft, non-tender, No masses, hepatosplenomegaly, No lymphadenopathy Renal:  No costovertebral tenderness  GU:  Not performed at this time. Endoc: No evident thyromegaly, no signs of acromegaly. Skin:   warm, no rashes, no ecchymosis  Extremities: normal, no cyanosis, clubbing, no edema, warm with normal capillary refill.    LABS: Reviewed   LABORATORY PANEL:   CBC  Recent Labs Lab 11/11/16 1126  WBC 11.0  HGB 18.0*  HCT 53.7*  PLT 236    Chemistries   Recent Labs Lab 11/11/16 1126 11/11/16 1148  NA 138  --   K 5.1  --   CL 102  --   CO2 28  --   GLUCOSE 123*  --   BUN 21*  --   CREATININE 0.94  --   CALCIUM 9.5  --   AST  --  24  ALT  --  14  ALKPHOS  --  63  BILITOT  --  0.5    No results for input(s): GLUCAP in the last 168 hours. No results for input(s): PHART, PCO2ART, PO2ART in the last 168 hours.  Recent Labs Lab 11/11/16 1148  AST 24  ALT 14  ALKPHOS 63  BILITOT 0.5  ALBUMIN 3.9    Cardiac Enzymes  Recent Labs Lab 11/11/16 1126  TROPONINI 0.27*    RADIOLOGY:  Dg Chest 2 View  Result Date: 11/11/2016 CLINICAL DATA:  Chest pain EXAM: CHEST  2 VIEW COMPARISON:  None. FINDINGS: Normal heart size. Aortic atherosclerosis. Otherwise normal mediastinal contour. No pneumothorax. No pleural effusion. Hyperinflated lungs. No pulmonary edema. No acute consolidative airspace disease. IMPRESSION: 1. No acute cardiopulmonary disease. 2. Hyperinflated lungs, suggesting COPD. 3. Aortic atherosclerosis. Electronically Signed   By: Delbert Phenix M.D.   On: 11/11/2016 12:16       --Wells Guiles, MD.  Board Certified in Internal Medicine, Pulmonary Medicine,  Critical Care Medicine, and Sleep Medicine.  ICU Pager [phone] [place] [facility] Office Number: [phone]  Santiago Glad, M.D.  Stephanie Acre, M.D.  Billy Fischer, M.D   11/11/2016, 2:25 PM

## 2016-11-11 NOTE — ED Triage Notes (Signed)
Pt to ED via EMS from home c/o palpitations last night.  States had weakness through bilateral arms and shoulders.  Pt denies hx of a.fib.  Only takes gabapentin.  Pt A&Ox4, speaking in complete and coherent sentences, chest rise even and unlabored.

## 2016-11-11 NOTE — Telephone Encounter (Signed)
Will follow up based on the disposition at the ER She can at least get some IV fluids

## 2016-11-11 NOTE — ED Provider Notes (Addendum)
[facility] Emergency Department Provider Note  ____________________________________________  Time seen: Approximately 11:39 AM  I have reviewed the triage vital signs and the nursing notes.   HISTORY  Chief Complaint Atrial Fibrillation and Palpitations    HPI Heidi Carroll is a 76 y.o. female with active tobacco abuse but otherwise healthy presenting with palpitations area did the patient reports thatlast night when going to bed, she developed central palpitations described as "racing heart rate." This was associated with generalized weakness. She denies any chest pain, shortness of breath, lightheadedness or syncope but did have significant difficulty due to energy decrease walking her dog this morning. No recent changes in medications, no personal or family history of blood clots, denies cocaine.  On arrival to the emergency department the patient has a heart rate in the 210s, with a blood pressure in the 90s, with improvement of her heart rate into the 120s and normalization of blood pressure without any intervention.  SH: + tobacco, no ETOH or cocaine  Past Medical History:  Diagnosis Date  . Depression   . Neuropathy [facility](HCC)    idiopathic    Patient Active Problem List   Diagnosis Date Noted  . Cigarette smoker 12/31/2015  . Preventative health care 12/22/2014  . Advance directive discussed with patient 12/22/2014  . Idiopathic peripheral neuropathy 04/02/2007    Past Surgical History:  Procedure Laterality Date  . FOOT SURGERY  1995   bilateral   . SPINAL CORD STIMULATOR IMPLANT  12/1996   didn't keep  . VAGINAL DELIVERY  1963   X 1  . VAGINAL HYSTERECTOMY  1974    Current Outpatient Rx  . Order #: [id number] Class: Historical Med  . Order #: [id number] Class: Historical Med  . Order #: [id number] Class: Normal    Allergies Cymbalta [duloxetine hcl] and Penicillins  Family History  Problem Relation Age of Onset  . Cancer Father   .  Diabetes Maternal Grandfather     Social History Social History  Substance Use Topics  . Smoking status: Current Every Day Smoker    Packs/day: 1.00    Types: Cigarettes  . Smokeless tobacco: Never Used     Comment: . Info given on 1-800-QUIT NOW  . Alcohol use No    Review of Systems Constitutional: No fever/chills.Has a generalized weakness. Negative lightheadedness or syncope. Positive decreased exercise tolerance. Eyes: No visual changes. ENT: No sore throat. No congestion or rhinorrhea. Cardiovascular: Denies chest pain. Positive palpitations. Respiratory: Denies shortness of breath.  No cough. Gastrointestinal: No abdominal pain.  No nausea, no vomiting.  No diarrhea.  No constipation. Genitourinary: Negative for dysuria. Musculoskeletal: Negative for back pain. Skin: Negative for rash. Neurological: Negative for headaches. No focal numbness, tingling or weakness.   10-point ROS otherwise negative.  ____________________________________________   PHYSICAL EXAM:  VITAL SIGNS: ED Triage Vitals [11/11/16 1114]  Enc Vitals Group     BP 93/75     Pulse Rate 96     Resp 18     Temp 97.9 F (36.6 C)     Temp Source Oral     SpO2 94 %     Weight      Height      Head Circumference      Peak Flow      Pain Score      Pain Loc      Pain Edu?      Excl. in GC?     Constitutional: Alert and oriented. Well appearing and  in no acute distress. Answers questions appropriately. Eyes: Conjunctivae are normal.  EOMI. No scleral icterus. Head: Atraumatic. Nose: No congestion/rhinnorhea. Mouth/Throat: Mucous membranes are moist.  Neck: No stridor.  Supple.  No JVD. Cardiovascular: Fast rate, regular rhythm. No murmurs, rubs or gallops.  Respiratory: Normal respiratory effort.  No accessory muscle use or retractions. Lungs CTAB.  No wheezes, rales or ronchi. Gastrointestinal: Soft, nontender and nondistended.  No guarding or rebound.  No peritoneal signs. Musculoskeletal:  No LE edema. No ttp in the calves or palpable cords.  Negative Homan's sign. Neurologic:  A&Ox3.  Speech is clear.  Face and smile are symmetric.  EOMI.  Moves all extremities well. Skin:  Skin is warm, dry and intact. No rash noted. Psychiatric: Mood and affect are normal. Speech and behavior are normal.  Normal judgement.  ____________________________________________   LABS (all labs ordered are listed, but only abnormal results are displayed)  Labs Reviewed  CBC - Abnormal; Notable for the following:       Result Value   RBC 5.45 (*)    Hemoglobin 18.0 (*)    HCT 53.7 (*)    All other components within normal limits  APTT - Abnormal; Notable for the following:    aPTT 39 (*)    All other components within normal limits  PROTIME-INR  BASIC METABOLIC PANEL  TROPONIN I  HEPATIC FUNCTION PANEL  TSH   ____________________________________________  EKG  ED ECG REPORT I, Rockne Menghini, the attending physician, personally viewed and interpreted this ECG.   Date: 11/11/2016  EKG Time: 1121  Rate: 217  Rhythm: regular rhythm, tachycardia, pwaves cannot be destinguished  Axis: leftward  Intervals:none  ST&T Change: ST depressions in V3 through V6, as well as 23; ST elevation isolated, 2 mm in aVR.   Repeat EKG ED ECG REPORT I, Rockne Menghini, the attending physician, personally viewed and interpreted this ECG.   Date: 11/11/2016  EKG Time: 1134  Rate: 123  Rhythm: tachycardia, pwaves can be seen, consider flutter waves within the ST wave; p waves appear to have similar morphology so MAT less likely  Axis: normal  Intervals:none  ST&T Change: T-wave depressions have resolved, as has the ST elevation in AVR now that heart rate has decreased to 123. No evidence of STEMI.  ____________________________________________  RADIOLOGY  Dg Chest 2 View  Result Date: 11/11/2016 CLINICAL DATA:  Chest pain EXAM: CHEST  2 VIEW COMPARISON:  None. FINDINGS: Normal  heart size. Aortic atherosclerosis. Otherwise normal mediastinal contour. No pneumothorax. No pleural effusion. Hyperinflated lungs. No pulmonary edema. No acute consolidative airspace disease. IMPRESSION: 1. No acute cardiopulmonary disease. 2. Hyperinflated lungs, suggesting COPD. 3. Aortic atherosclerosis. Electronically Signed   By: Delbert Phenix M.D.   On: 11/11/2016 12:16    ____________________________________________   PROCEDURES  Procedure(s) performed: None  Procedures  Critical Care performed: Yes, see critical care note(s) ____________________________________________   INITIAL IMPRESSION / ASSESSMENT AND PLAN / ED COURSE  Pertinent labs & imaging results that were available during my care of the patient were reviewed by me and considered in my medical decision making (see chart for details).  76 y.o. female presenting with tachycardia, rate of 217 with blood pressure of 93/75 on arrival. At this time, the patient has a heart rate of 123 with a normalized blood pressure without any intervention, but I will treat her with a beta blocker. The morphology of her tachycardia is not quite clear, consider sinus tachycardia versus possibly a flutter. An ectopic tachycardia  is also possible. I'll check her electrolytes, her thyroid panel, troponin, and plan admission.  CRITICAL CARE Performed by: Rockne Menghini   Total critical care time: 60 minutes  Critical care time was exclusive of separately billable procedures and treating other patients.  Critical care was necessary to treat or prevent imminent or life-threatening deterioration.  Critical care was time spent personally by me on the following activities: development of treatment plan with patient and/or surrogate as well as nursing, discussions with consultants, evaluation of patient's response to treatment, examination of patient, obtaining history from patient or surrogate, ordering and performing treatments and  interventions, ordering and review of laboratory studies, ordering and review of radiographic studies, pulse oximetry and re-evaluation of patient's condition.  ----------------------------------------- 12:39 PM on 11/11/2016 -----------------------------------------  The nurse came to see me that the patient had not had any change in her heart rate 45 minutes from metoprolol. Several minutes later, the patient had an acute drop in her heart rate with a bradycardia to the 40s. She maintains her blood pressure 100/70, but has an EKG which is concerning for a junctional rhythm. I spoke with Dr. Darrold Junker, the cardiologist on-call, who recommends continued workup with monitoring. Her troponin is positive and she has received aspirin, but again she remained chest pain-free. I have called the ICU for admission.  ----------------------------------------- 1:12 PM on 11/11/2016 -----------------------------------------  Dr. Darrold Junker has come to the ED to evaluate the patient.  I am still awaiting bloodwork, but there is a computer problem in the lab, so we will proceed with admission. ____________________________________________  FINAL CLINICAL IMPRESSION(S) / ED DIAGNOSES  Final diagnoses:  NSTEMI (non-ST elevated myocardial infarction) (HCC)  Sinus tachycardia  Hypotension, unspecified hypotension type  Junctional bradycardia    Clinical Course       NEW MEDICATIONS STARTED DURING THIS VISIT:  New Prescriptions   No medications on file      Rockne Menghini, MD 11/11/16 1241    Rockne Menghini, MD 11/11/16 1313

## 2016-11-12 ENCOUNTER — Encounter: Payer: Self-pay | Admitting: Internal Medicine

## 2016-11-12 ENCOUNTER — Inpatient Hospital Stay
Admit: 2016-11-12 | Discharge: 2016-11-12 | Disposition: A | Discharge status: Home / Self Care | Payer: Medicare Other | Attending: Cardiology | Admitting: Cardiology

## 2016-11-12 LAB — TROPONIN I: TROPONIN I: 0.97 ng/mL — AB (ref <0.03)

## 2016-11-12 LAB — BASIC METABOLIC PANEL
Anion gap: 6 (ref 5–15)
BUN: 25 mg/dL — AB (ref 6–20)
CALCIUM: 8.6 mg/dL — AB (ref 8.9–10.3)
CO2: 26 mmol/L (ref 22–32)
Chloride: 106 mmol/L (ref 101–111)
Creatinine, Ser: 0.73 mg/dL (ref 0.44–1.00)
GFR calc Af Amer: >60 mL/min (ref >60)
GFR calc non Af Amer: >60 mL/min (ref >60)
GLUCOSE: 104 mg/dL — AB (ref 65–99)
Potassium: 3.6 mmol/L (ref 3.5–5.1)
Sodium: 138 mmol/L (ref 135–145)

## 2016-11-12 LAB — CBC
HEMATOCRIT: 44.1 % (ref 35.0–47.0)
HEMOGLOBIN: 14.9 g/dL (ref 12.0–16.0)
MCH: 33.1 pg (ref 26.0–34.0)
MCHC: 33.9 g/dL (ref 32.0–36.0)
MCV: 97.5 fL (ref 80.0–100.0)
Platelets: 184 10*3/uL (ref 150–440)
RBC: 4.52 MIL/uL (ref 3.80–5.20)
RDW: 12.8 % (ref 11.5–14.5)
WBC: 8.5 10*3/uL (ref 3.6–11.0)

## 2016-11-12 LAB — GLUCOSE, CAPILLARY: GLUCOSE-CAPILLARY: 106 mg/dL — AB (ref 65–99)

## 2016-11-12 LAB — PHOSPHORUS: Phosphorus: 3 mg/dL (ref 2.5–4.6)

## 2016-11-12 LAB — ECHOCARDIOGRAM COMPLETE
Height: 62 in
Weight: 2328.06 oz

## 2016-11-12 LAB — MAGNESIUM: Magnesium: 1.8 mg/dL (ref 1.7–2.4)
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
## 2016-11-12 NOTE — Progress Notes (Signed)
This patient has been A&O x 4, VSS, denying pain and SOB, sitting on the edge of the bed watching TV (the majority of this shift), symptom-free, sinus rhythm in the 60's during this shift. Patient ambulated around the unit twice w/o difficulty, pain, and increased work of breathing. She is awaiting a med-surg telemetry bed assignment. She has no complaints, other than wanting to go home. Will continue to monitor.

## 2016-11-12 NOTE — Progress Notes (Signed)
[facility]  SUBJECTIVE: I feel better   Vitals:   11/12/16 0330 11/12/16 0400 11/12/16 0500 11/12/16 0600  BP: (!) 95/43 (!) 98/51  (!) 131/97  Pulse: (!) 52 (!) 51  (!) 52  Resp: (!) 23 (!) 24  (!) 25  Temp:      TempSrc:      SpO2: 91% 92%  (!) 89%  Weight:   66 kg (145 lb 8.1 oz)   Height:        No intake or output data in the 24 hours ending 11/12/16 0813    PHYSICAL EXAM  General: Well developed, well nourished, in no acute distress HEENT:  Normocephalic and atramatic Neck:  No JVD.  Lungs: Clear bilaterally to auscultation and percussion. Heart: HRRR . Normal S1 and S2 without gallops or murmurs.  Abdomen: Bowel sounds are positive, abdomen soft and non-tender  Msk:  Back normal, normal gait. Normal strength and tone for age. Extremities: No clubbing, cyanosis or edema.   Neuro: Alert and oriented X 3. Psych:  Good affect, responds appropriately   LABS: Basic Metabolic Panel:  Recent Labs  16/07/9600/16/18 2021 11/12/16 0034  NA 139 138  K 4.6 3.6  CL 107 106  CO2 24 26  GLUCOSE 110* 104*  BUN 27* 25*  CREATININE 0.71 0.73  CALCIUM 9.0 8.6*  MG  --  1.8  PHOS  --  3.0   Liver Function Tests:  Recent Labs  11/11/16 1148 11/11/16 2021  AST 24 28  ALT 14 12*  ALKPHOS 63 58  BILITOT 0.5 1.1  PROT 6.6 6.1*  ALBUMIN 3.9 3.5   No results for input(s): LIPASE, AMYLASE in the last 72 hours. CBC:  Recent Labs  11/11/16 1126 11/12/16 0034  WBC 11.0 8.5  HGB 18.0* 14.9  HCT 53.7* 44.1  MCV 98.6 97.5  PLT 236 184   Cardiac Enzymes:  Recent Labs  11/11/16 1126 11/12/16 0034  TROPONINI 0.27* 0.97*   BNP: Invalid input(s): POCBNP D-Dimer: No results for input(s): DDIMER in the last 72 hours. Hemoglobin A1C: No results for input(s): HGBA1C in the last 72 hours. Fasting Lipid Panel: No results for input(s): CHOL, HDL, LDLCALC, TRIG, CHOLHDL, LDLDIRECT in the last 72 hours. Thyroid Function Tests:  Recent Labs  11/11/16 1148  TSH  1.677   Anemia Panel: No results for input(s): VITAMINB12, FOLATE, FERRITIN, TIBC, IRON, RETICCTPCT in the last 72 hours.  Dg Chest 2 View  Result Date: 11/11/2016 CLINICAL DATA:  Chest pain EXAM: CHEST  2 VIEW COMPARISON:  None. FINDINGS: Normal heart size. Aortic atherosclerosis. Otherwise normal mediastinal contour. No pneumothorax. No pleural effusion. Hyperinflated lungs. No pulmonary edema. No acute consolidative airspace disease. IMPRESSION: 1. No acute cardiopulmonary disease. 2. Hyperinflated lungs, suggesting COPD. 3. Aortic atherosclerosis. Electronically Signed   By: Delbert PhenixJason A Poff M.D.   On: 11/11/2016 12:16     Echo pending  TELEMETRY: Sinus bradycardia:  ASSESSMENT AND PLAN:  Active Problems:   Bradycardia    1. Paroxysmal atrial flutter, inverted 2 junctional rhythm, and sinus bradycardia, no recurrence 2. Sinus bradycardia at a rate of 55 bpm, probable baseline rate, asymptomatic  Recommendations  1. Continue current therapy 2. Review 2-D echocardiogram 3. Ambulate today, the patient does well consider discharge home, with further outpatient evaluation including long-term chronic anticoagulation   Marcina MillardAlexander Crit Obremski, MD, PhD, [facility] 11/12/2016 8:13 AM

## 2016-11-12 NOTE — Progress Notes (Signed)
[place] responded to an OR for a Heidi Carroll in [place] who requested for prayers. Heidi Carroll met Heidi Carroll who was lying in the bed watching Tv. Heidi Carroll appeared in a pleasant spirit, talked about he husband who passed away in 01-07-2013, and also expressed to [facility] that she lives alone with her bird and cat. Heidi Carroll requested for prayer for healing, which the [facility] provided.     11/12/16 0700  Clinical Encounter Type  Visited With Patient  Visit Type Initial;Spiritual support  Referral From Nurse  Consult/Referral To Chaplain  Spiritual Encounters  Spiritual Needs Prayer

## 2016-11-12 NOTE — Progress Notes (Signed)
Dr. Darrold JunkerParaschos made aware of Troponin of 0.97. No new orders of this time.

## 2016-11-12 NOTE — Progress Notes (Signed)
[facility]     ASSESSMENT/PLAN   76 yo female presents with palpitations, near syncope with junctional bradycardia.    CARDIOVASCULAR A: Initial tachycardia, now with junctional bradycardia with pulse as low as 29 with associated dizziness-- appears resolved, no further episodes overnight.   --Pt ambulated, appears stable.  P:  Cardiology following, stable for DC home.   --Hold meds, monitor in stepdown unit.   Tobacco abuse: discussed smoking cessation.   --Will plan for DC home, however currently unable due to poor weather condition, will plan for DC in AM.   ---------------------------------------  ---------------------------------------   Name: Heidi Carroll MRN: [id number] DOB: Dec 05, 1940    ADMISSION DATE:  11/11/2016   Subjective:  No new complaints today, feels well, would like to go home.   REVIEW OF SYSTEMS:   Constitutional: Feels well. Cardiovascular: No chest pain.  Pulmonary: Denies dyspnea.   The remainder of systems were reviewed and were found to be negative other than what is documented in the HPI.    VITAL SIGNS: Temp:  [97.6 F (36.4 C)-98.2 F (36.8 C)] 97.6 F (36.4 C) (01/17 0800) Pulse Rate:  [43-130] 54 (01/17 1200) Resp:  [13-31] 22 (01/17 1200) BP: (92-131)/(43-97) 123/54 (01/17 1200) SpO2:  [89 %-98 %] 95 % (01/17 1200) Weight:  [145 lb 8.1 oz (66 kg)-149 lb (67.6 kg)] 145 lb 8.1 oz (66 kg) (01/17 0500) HEMODYNAMICS:   VENTILATOR SETTINGS:   INTAKE / OUTPUT:  Intake/Output Summary (Last 24 hours) at 11/12/16 1403 Last data filed at 11/12/16 0900  Gross per 24 hour  Intake              240 ml  Output                0 ml  Net              240 ml    Physical Examination:   VS: BP (!) 123/54   Pulse (!) 54   Temp 97.6 F (36.4 C) (Oral)   Resp (!) 22   Ht 5\' 2"  (1.575 m)   Wt 145 lb 8.1 oz (66 kg)   SpO2 95%   BMI 26.61 kg/m   General Appearance: No distress  Neuro:without  focal findings, mental status normal,. HEENT: PERRLA, EOM intact, no ptosis, Pulmonary: normal breath sounds., diaphragmatic excursion normal. CardiovascularNormal S1,S2.  No m/r/g. Huston FoleyBrady.     Abdomen: Benign, Soft, non-tender,  No lymphadenopathy Renal:  No costovertebral tenderness  GU:  Not performed at this time. Endoc: No evident thyromegaly, no signs of acromegaly. Skin:   warm, no rashes, no ecchymosis  Extremities: normal, no cyanosis, clubbing, no edema, warm with normal capillary refill.    LABS: Reviewed   LABORATORY PANEL:   CBC  Recent Labs Lab 11/12/16 0034  WBC 8.5  HGB 14.9  HCT 44.1  PLT 184    Chemistries   Recent Labs Lab 11/11/16 2021 11/12/16 0034  NA 139 138  K 4.6 3.6  CL 107 106  CO2 24 26  GLUCOSE 110* 104*  BUN 27* 25*  CREATININE 0.71 0.73  CALCIUM 9.0 8.6*  MG  --  1.8  PHOS  --  3.0  AST 28  --   ALT 12*  --   ALKPHOS 58  --   BILITOT 1.1  --      Recent Labs Lab 11/11/16 2019  GLUCAP 106*   No results for input(s): PHART, PCO2ART, PO2ART in  the last 168 hours.  Recent Labs Lab 11/11/16 1148 11/11/16 2021  AST 24 28  ALT 14 12*  ALKPHOS 63 58  BILITOT 0.5 1.1  ALBUMIN 3.9 3.5    Cardiac Enzymes  Recent Labs Lab 11/12/16 0034  TROPONINI 0.97*    RADIOLOGY:  Dg Chest 2 View  Result Date: 11/11/2016 CLINICAL DATA:  Chest pain EXAM: CHEST  2 VIEW COMPARISON:  None. FINDINGS: Normal heart size. Aortic atherosclerosis. Otherwise normal mediastinal contour. No pneumothorax. No pleural effusion. Hyperinflated lungs. No pulmonary edema. No acute consolidative airspace disease. IMPRESSION: 1. No acute cardiopulmonary disease. 2. Hyperinflated lungs, suggesting COPD. 3. Aortic atherosclerosis. Electronically Signed   By: Delbert Phenix M.D.   On: 11/11/2016 12:16       --Wells Guiles, MD.  Board Certified in Internal Medicine, Pulmonary Medicine, Critical Care Medicine, and Sleep Medicine.  ICU Pager  [phone] [facility] Office Number: [phone]  Santiago Glad, M.D.  Stephanie Acre, M.D.  Billy Fischer, M.D   11/12/2016, 2:03 PM

## 2016-11-12 NOTE — Discharge Instructions (Signed)
Bradycardia, Adult °Bradycardia is a slower-than-normal heartbeat. A normal resting heart rate for an adult ranges from 60 to 100 beats per minute. With bradycardia, the resting heart rate is less than 60 beats per minute. °Bradycardia can prevent enough oxygen from reaching certain areas of your body when you are active. It can be serious if it keeps enough oxygen from reaching your brain and other parts of your body. Bradycardia is not a problem for everyone. For some healthy adults, a slow resting heart rate is normal. °What are the causes? °This condition may be caused by: °· A problem with the heart, including: °? A problem with the heart's electrical system, such as a heart block. °? A problem with the heart's natural pacemaker (sinus node). °? Heart disease. °? A heart attack. °? Heart damage. °? A heart infection. °? A heart condition that is present at birth (congenital heart defect). °· Certain medicines that treat heart conditions. °· Certain conditions, such as hypothyroidism and obstructive sleep apnea. °· Problems with the balance of chemicals and other substances, like potassium, in the blood. ° °What increases the risk? °This condition is more likely to develop in adults who: °· Are age 65 or older. °· Have high blood pressure (hypertension), high cholesterol (hyperlipidemia), or diabetes. °· Drink heavily, use tobacco or nicotine products, or use drugs. °· Are stressed. ° °What are the signs or symptoms? °Symptoms of this condition include: °· Light-headedness. °· Feeling faint or fainting. °· Fatigue and weakness. °· Shortness of breath. °· Chest pain (angina). °· Drowsiness. °· Confusion. °· Dizziness. ° °How is this diagnosed? °This condition may be diagnosed based on: °· Your symptoms. °· Your medical history. °· A physical exam. ° °During the exam, your health care provider will listen to your heartbeat and check your pulse. To confirm the diagnosis, your health care provider may order tests,  such as: °· Blood tests. °· An electrocardiogram (ECG). This test records the heart's electrical activity. The test can show how fast your heart is beating and whether the heartbeat is steady. °· A test in which you wear a portable device (event recorder or Holter monitor) to record your heart's electrical activity while you go about your day. °· An exercise test. ° °How is this treated? °Treatment for this condition depends on the cause of the condition and how severe your symptoms are. Treatment may involve: °· Treatment of the underlying condition. °· Changing your medicines or how much medicine you take. °· Having a small, battery-operated device called a pacemaker implanted under the skin. When bradycardia occurs, this device can be used to increase your heart rate and help your heart to beat in a regular rhythm. ° °Follow these instructions at home: °Lifestyle ° °· Manage any health conditions that contribute to bradycardia as told by your health care provider. °· Follow a heart-healthy diet. A nutrition specialist (dietitian) can help to educate you about healthy food options and changes. °· Follow an exercise program that is approved by your health care provider. °· Maintain a healthy weight. °· Try to reduce or manage your stress, such as with yoga or meditation. If you need help reducing stress, ask your health care provider. °· Do not use use any products that contain nicotine or tobacco, such as cigarettes and e-cigarettes. If you need help quitting, ask your health care provider. °· Do not use illegal drugs. °· Limit alcohol intake to no more than 1 drink per day for nonpregnant women and 2 drinks per   day for men. One drink equals 12 oz of beer, 5 oz of wine, or 1½ oz of hard liquor. °General instructions °· Take over-the-counter and prescription medicines only as told by your health care provider. °· Keep all follow-up visits as directed by your health care provider. This is important. °How is this  prevented? °In some cases, bradycardia may be prevented by: °· Treating underlying medical problems. °· Stopping behaviors or medicines that can trigger the condition. ° °Contact a health care provider if: °· You feel light-headed or dizzy. °· You almost faint. °· You feel weak or are easily fatigued during physical activity. °· You experience confusion or have memory problems. °Get help right away if: °· You faint. °· You have an irregular heartbeat (palpitations). °· You have chest pain. °· You have trouble breathing. °This information is not intended to replace advice given to you by your health care provider. Make sure you discuss any questions you have with your health care provider. °Document Released: 07/05/2002 Document Revised: 06/10/2016 Document Reviewed: 04/03/2016 °Elsevier Interactive Patient Education © 2017 [employer] ° °

## 2016-11-12 NOTE — Progress Notes (Signed)
*  PRELIMINARY RESULTS* Echocardiogram 2D Echocardiogram has been performed.  Cristela BlueHege, Parsa Rickett 11/12/2016, 11:01 AM

## 2016-11-13 NOTE — Progress Notes (Signed)
[facility] Cardiology  SUBJECTIVE: I feel better   Vitals:   11/13/16 0500 11/13/16 0600 11/13/16 0700 11/13/16 0800  BP: 115/64 128/76 (!) 120/53 (!) 126/59  Pulse: (!) 59 (!) 53 64 (!) 49  Resp: 18 (!) 24 (!) 24 19  Temp:      TempSrc:      SpO2: 94% 92% 91% 91%  Weight:      Height:         Intake/Output Summary (Last 24 hours) at 11/13/16 1610 Last data filed at 11/12/16 0900  Gross per 24 hour  Intake              240 ml  Output                0 ml  Net              240 ml      PHYSICAL EXAM  General: Well developed, well nourished, in no acute distress HEENT:  Normocephalic and atramatic Neck:  No JVD.  Lungs: Clear bilaterally to auscultation and percussion. Heart: HRRR . Normal S1 and S2 without gallops or murmurs.  Abdomen: Bowel sounds are positive, abdomen soft and non-tender  Msk:  Back normal, normal gait. Normal strength and tone for age. Extremities: No clubbing, cyanosis or edema.   Neuro: Alert and oriented X 3. Psych:  Good affect, responds appropriately   LABS: Basic Metabolic Panel:  Recent Labs  96/04/54 2021 11/12/16 0034  NA 139 138  K 4.6 3.6  CL 107 106  CO2 24 26  GLUCOSE 110* 104*  BUN 27* 25*  CREATININE 0.71 0.73  CALCIUM 9.0 8.6*  MG  --  1.8  PHOS  --  3.0   Liver Function Tests:  Recent Labs  11/11/16 1148 11/11/16 2021  AST 24 28  ALT 14 12*  ALKPHOS 63 58  BILITOT 0.5 1.1  PROT 6.6 6.1*  ALBUMIN 3.9 3.5   No results for input(s): LIPASE, AMYLASE in the last 72 hours. CBC:  Recent Labs  11/11/16 1126 11/12/16 0034  WBC 11.0 8.5  HGB 18.0* 14.9  HCT 53.7* 44.1  MCV 98.6 97.5  PLT 236 184   Cardiac Enzymes:  Recent Labs  11/11/16 1126 11/12/16 0034  TROPONINI 0.27* 0.97*   BNP: Invalid input(s): POCBNP D-Dimer: No results for input(s): DDIMER in the last 72 hours. Hemoglobin A1C: No results for input(s): HGBA1C in the last 72 hours. Fasting Lipid Panel: No results for input(s): CHOL, HDL,  LDLCALC, TRIG, CHOLHDL, LDLDIRECT in the last 72 hours. Thyroid Function Tests:  Recent Labs  11/11/16 1148  TSH 1.677   Anemia Panel: No results for input(s): VITAMINB12, FOLATE, FERRITIN, TIBC, IRON, RETICCTPCT in the last 72 hours.  Dg Chest 2 View  Result Date: 11/11/2016 CLINICAL DATA:  Chest pain EXAM: CHEST  2 VIEW COMPARISON:  None. FINDINGS: Normal heart size. Aortic atherosclerosis. Otherwise normal mediastinal contour. No pneumothorax. No pleural effusion. Hyperinflated lungs. No pulmonary edema. No acute consolidative airspace disease. IMPRESSION: 1. No acute cardiopulmonary disease. 2. Hyperinflated lungs, suggesting COPD. 3. Aortic atherosclerosis. Electronically Signed   By: Delbert Phenix M.D.   On: 11/11/2016 12:16     Echo LV EF 55-65%  TELEMETRY: Sinus bradycardia 55 bpm:  ASSESSMENT AND PLAN:  Active Problems:   Bradycardia    1. Paroxysmal atrial flutter, without recurrence 2. Transient junctional rhythm without recurrence  Recommendations  1. Agree with current therapy 2. May discharge home, with further outpatient  management including considering long-term chronic anticoagulation  Signed off for now, please call if any questions   Marcina MillardAlexander Elyon Zoll, MD, PhD, [facility] 11/13/2016 8:23 AM

## 2016-11-13 NOTE — Progress Notes (Signed)
This patient has received order for discharge home. She is A&O x 4, denies chest pain, SOB, dizziness, and any further complaint. She is in a sinus brady rhythm at a rate of 58, RR 18, SpO2 97% on room air. The pt's nephew is at the bedside to take her home. The patient verbalized to me that Dr. Darrold JunkerParaschos told her to call his office to schedule a follow-up appointment. She also stated that her scheduled physical is on March 15 - "I'll call to have it moved up sooner." D/C instructions were reviewed w/ the pt and her nephew. She verbalized her understanding of these, signed and dated for receiving written instructions. I went to get the patient a cup of coffee to go, and when I returned, she and her nephew had left the room. Unable to find them on the Unit. Pt did not wait for wheelchair to be rolled out to the car.

## 2016-11-14 ENCOUNTER — Telehealth: Payer: Self-pay

## 2016-11-14 NOTE — Telephone Encounter (Signed)
PLEASE NOTE: All timestamps contained within this report are represented as [place] Standard Time. CONFIDENTIALTY NOTICE: This fax transmission is intended only for the addressee. It contains information that is legally privileged, confidential or otherwise protected from use or disclosure. If you are not the intended recipient, you are strictly prohibited from reviewing, disclosing, copying using or disseminating any of this information or taking any action in reliance on or regarding this information. If you have received this fax in error, please notify us immediately by telephone so that we can arrange for its return to [place]. Phone: [phone], Toll-Free: [phone], Fax: [phone] Page: 1 of 2 Call Id: [id number] [facility] Day - Client TELEPHONE ADVICE RECORD [facility] Patient Name: Heidi Carroll Gender: Female DOB: [birth year] Age: 76 Y 5 M 19 D Return Phone Number: [phone] (Primary) Address: City/State/Zip: [place] Client [facility] [place] Day - Client Client Site [facility] [place] - Day Physician Tillman Abide - MD Contact Type Call Who Is Calling Patient / Member / Family / Caregiver Call Type Triage / Clinical Relationship To Patient Self Return Phone Number [phone] (Primary) Chief Complaint Heart palpitations or irregular heartbeat Reason for Call Symptomatic / Request for Health Information Initial Comment Caller states weak, feels like can't lift both arms; heart racing; 4x diarrhea this morning; sudden weakness 11:30 last night; URG PER CHARGE Appointment Disposition EMR Appointment Scheduled Info pasted into Epic Yes PreDisposition Call Doctor Translation No Nurse Assessment Nurse: Kizzie Bane, RN, Zella Ball Date/Time (Eastern Time): 11/11/2016 9:00:04 AM Confirm and document reason for call. If symptomatic, describe symptoms. ---Caller states that she is weak and feels like can't lift both  arms; states that she feels like her heart is racing; 4 x diarrhea this morning; sudden weakness since 11:30 last night; Doesn't feel like she's running a fever. Does the patient have any new or worsening symptoms? ---Yes Will a triage be completed? ---Yes Related visit to physician within the last 2 weeks? ---No Does the PT have any chronic conditions? (i.e. diabetes, asthma, etc.) ---Yes List chronic conditions. ---idiopathic neuropathy Is this a behavioral health or substance abuse call? ---No Nurse: Kizzie Bane, RN, Zella Ball Date/Time (Eastern Time): 11/11/2016 9:28:13 AM Confirm and document reason for call. If symptomatic, describe symptoms. ---Caller states that her HR is 127 Does the patient have any new or worsening symptoms? ---Yes Will a triage be completed? ---Yes Related visit to physician within the last 2 weeks? ---No Does the PT have any chronic conditions? (i.e. diabetes, asthma, etc.) ---Unknown PLEASE NOTE: All timestamps contained within this report are represented as [place] Standard Time. CONFIDENTIALTY NOTICE: This fax transmission is intended only for the addressee. It contains information that is legally privileged, confidential or otherwise protected from use or disclosure. If you are not the intended recipient, you are strictly prohibited from reviewing, disclosing, copying using or disseminating any of this information or taking any action in reliance on or regarding this information. If you have received this fax in error, please notify us immediately by telephone so that we can arrange for its return to [place]. Phone: [phone], Toll-Free: [phone], Fax: [phone] Page: 2 of 2 Call Id: [id number] Nurse Assessment Is this a behavioral health or substance abuse call? ---No Guidelines Guideline Title Affirmed Question Affirmed Notes Nurse Date/Time Lamount Cohen Time) Weakness (Generalized) and Fatigue [1] MODERATE weakness (i.e., interferes with work,  school, normal activities) AND [2] cause unknown (Exceptions: weakness with acute minor illness, or weakness from poor fluid intake)  Kizzie BaneHughes, RN, Zella Ballobin 11/11/2016 9:13:06 AM Heart Rate and Heartbeat Questions Palpitations (all triage questions negative) Kizzie BaneHughes, RN, Zella Ballobin 11/11/2016 9:29:07 AM Disp. Time Lamount Cohen(Eastern Time) Disposition Final User 11/11/2016 8:56:25 AM Send to Urgent Queue Albin FischerWatson, Rhonda 11/11/2016 9:27:44 AM See Physician within 4 Hours (or PCP triage) Kizzie BaneHughes, RN, Robin 11/11/2016 9:31:22 AM Send To RN Personal Kizzie BaneHughes, RN, Robin 11/11/2016 9:38:58 AM Call Completed Leveda AnnaHensel, RN, Aeriel 11/11/2016 9:30:19 AM Home Care Yes Kizzie BaneHughes, RN, Carey Bullocksobin Caller Understands: Yes Disagree/Comply: Phil Doppomply Caller Understands: Yes Disagree/Comply: Comply Care Advice Given Per Guideline SEE PHYSICIAN WITHIN 4 HOURS (or PCP triage): CARE ADVICE given per Weakness and Fatigue (Adult) guideline. HOME CARE: You should be able to treat this at home. CARE ADVICE given per Palpitations (Adult) guideline. Referrals REFERRED TO PCP OFFICE

## 2016-11-14 NOTE — Telephone Encounter (Signed)
I spoke to her today and she is doing well. Will set up hospital follow up for next week.

## 2016-11-14 NOTE — Telephone Encounter (Signed)
[facility] and discharges on 11/13/16.

## 2016-11-15 ENCOUNTER — Emergency Department: Payer: Medicare Other

## 2016-11-15 ENCOUNTER — Encounter: Payer: Self-pay | Admitting: Emergency Medicine

## 2016-11-15 ENCOUNTER — Inpatient Hospital Stay
Admission: EM | Admit: 2016-11-15 | Discharge: 2016-11-16 | DRG: 192 | Disposition: A | Discharge status: Home / Self Care | Payer: Medicare Other | Attending: Internal Medicine | Admitting: Internal Medicine

## 2016-11-15 DIAGNOSIS — Z833 Family history of diabetes mellitus: Secondary | ICD-10-CM

## 2016-11-15 DIAGNOSIS — Z880 Allergy status to penicillin: Secondary | ICD-10-CM | POA: Diagnosis not present

## 2016-11-15 DIAGNOSIS — G609 Hereditary and idiopathic neuropathy, unspecified: Secondary | ICD-10-CM | POA: Diagnosis present

## 2016-11-15 DIAGNOSIS — I10 Essential (primary) hypertension: Secondary | ICD-10-CM

## 2016-11-15 DIAGNOSIS — J441 Chronic obstructive pulmonary disease with (acute) exacerbation: Principal | ICD-10-CM | POA: Diagnosis present

## 2016-11-15 DIAGNOSIS — G629 Polyneuropathy, unspecified: Secondary | ICD-10-CM | POA: Diagnosis not present

## 2016-11-15 DIAGNOSIS — F17210 Nicotine dependence, cigarettes, uncomplicated: Secondary | ICD-10-CM | POA: Diagnosis present

## 2016-11-15 DIAGNOSIS — Z888 Allergy status to other drugs, medicaments and biological substances status: Secondary | ICD-10-CM

## 2016-11-15 DIAGNOSIS — Z7982 Long term (current) use of aspirin: Secondary | ICD-10-CM | POA: Diagnosis not present

## 2016-11-15 DIAGNOSIS — R748 Abnormal levels of other serum enzymes: Secondary | ICD-10-CM | POA: Diagnosis not present

## 2016-11-15 DIAGNOSIS — R531 Weakness: Secondary | ICD-10-CM

## 2016-11-15 DIAGNOSIS — R030 Elevated blood-pressure reading, without diagnosis of hypertension: Secondary | ICD-10-CM | POA: Diagnosis not present

## 2016-11-15 DIAGNOSIS — I160 Hypertensive urgency: Secondary | ICD-10-CM | POA: Diagnosis present

## 2016-11-15 DIAGNOSIS — M6281 Muscle weakness (generalized): Secondary | ICD-10-CM

## 2016-11-15 DIAGNOSIS — F329 Major depressive disorder, single episode, unspecified: Secondary | ICD-10-CM | POA: Diagnosis present

## 2016-11-15 DIAGNOSIS — Z809 Family history of malignant neoplasm, unspecified: Secondary | ICD-10-CM | POA: Diagnosis not present

## 2016-11-15 LAB — BASIC METABOLIC PANEL
ANION GAP: 7 (ref 5–15)
BUN: 20 mg/dL (ref 6–20)
CHLORIDE: 108 mmol/L (ref 101–111)
CO2: 27 mmol/L (ref 22–32)
Calcium: 9.5 mg/dL (ref 8.9–10.3)
Creatinine, Ser: 0.66 mg/dL (ref 0.44–1.00)
GFR calc non Af Amer: >60 mL/min (ref >60)
Glucose, Bld: 94 mg/dL (ref 65–99)
Potassium: 4.3 mmol/L (ref 3.5–5.1)
SODIUM: 142 mmol/L (ref 135–145)

## 2016-11-15 LAB — CBC
HEMATOCRIT: 45.1 % (ref 35.0–47.0)
HEMOGLOBIN: 15.5 g/dL (ref 12.0–16.0)
MCH: 33.1 pg (ref 26.0–34.0)
MCHC: 34.3 g/dL (ref 32.0–36.0)
MCV: 96.5 fL (ref 80.0–100.0)
Platelets: 178 10*3/uL (ref 150–440)
RBC: 4.68 MIL/uL (ref 3.80–5.20)
RDW: 12.7 % (ref 11.5–14.5)
WBC: 6.7 10*3/uL (ref 3.6–11.0)

## 2016-11-15 LAB — URINALYSIS, COMPLETE (UACMP) WITH MICROSCOPIC
BACTERIA UA: NONE SEEN
BILIRUBIN URINE: NEGATIVE
Glucose, UA: NEGATIVE mg/dL
HGB URINE DIPSTICK: NEGATIVE
KETONES UR: NEGATIVE mg/dL
Leukocytes, UA: NEGATIVE
NITRITE: NEGATIVE
Protein, ur: NEGATIVE mg/dL
SPECIFIC GRAVITY, URINE: 1.008 (ref 1.005–1.030)
pH: 5 (ref 5.0–8.0)

## 2016-11-15 LAB — MAGNESIUM: MAGNESIUM: 2.1 mg/dL (ref 1.7–2.4)

## 2016-11-15 LAB — TROPONIN I: TROPONIN I: 0.09 ng/mL — AB (ref <0.03)

## 2016-11-15 MED ORDER — IPRATROPIUM-ALBUTEROL 0.5-2.5 (3) MG/3ML IN SOLN
3.0000 mL | Freq: Once | RESPIRATORY_TRACT | Status: AC
  Administered 2016-11-15: 3 mL via RESPIRATORY_TRACT
  Filled 2016-11-15: qty 3

## 2016-11-15 MED ORDER — AZITHROMYCIN 500 MG PO TABS
500.0000 mg | ORAL_TABLET | Freq: Once | ORAL | Status: AC
  Administered 2016-11-15: 500 mg via ORAL
  Filled 2016-11-15: qty 1

## 2016-11-15 MED ORDER — METHYLPREDNISOLONE SODIUM SUCC 125 MG IJ SOLR
60.0000 mg | Freq: Once | INTRAMUSCULAR | Status: AC
  Administered 2016-11-15: 20:00:00 via INTRAVENOUS
  Filled 2016-11-15: qty 2

## 2016-11-15 MED ORDER — LORAZEPAM 0.5 MG PO TABS: 0.5000 mg | ORAL_TABLET | Freq: Once | ORAL | Status: DC

## 2016-11-15 NOTE — ED Triage Notes (Signed)
Pt presents to ED via [facility] with c/o hyertension. Per EMS pt was seen on Tuesday and D/C on Thursday for a HR of 217. Per EMS [place] came [place] today due to having high BP on her home machine. Pt denies chest pain, dizziness, light headed-ness. Pt states "I don't feel right". Pt states they were unable to determine the cause of the tachycardia at this time.

## 2016-11-15 NOTE — ED Notes (Signed)
Report given to Rachel, RN.

## 2016-11-15 NOTE — ED Provider Notes (Signed)
[facility] Emergency Department Provider Note    First MD Initiated Contact with Patient 11/15/16 1859     (approximate)  I have reviewed the triage vital signs and the nursing notes.   HISTORY  Chief Complaint Hypertension    HPI Heidi Carroll is a 76 y.o. female with his admission for rapid A. fib flutter presents with "not feeling right". Patient states his symptoms started today. Has been undergoing increased stress recently. States she checked her blood pressure) 218/95. Denies any chest pain. States that she feels weak. States that she is having trouble completing sentences. She does smoke 1 pack per day. No formal diagnosis of COPD. Not on any breathing treatments or steroids at home. No recent fevers.   Past Medical History:  Diagnosis Date  . Depression   . Neuropathy (HCC)    idiopathic   Family History  Problem Relation Age of Onset  . Cancer Father   . Diabetes Maternal Grandfather    Past Surgical History:  Procedure Laterality Date  . FOOT SURGERY  1995   bilateral   . SPINAL CORD STIMULATOR IMPLANT  12/1996   didn't keep  . VAGINAL DELIVERY  1963   X 1  . VAGINAL HYSTERECTOMY  1974   Patient Active Problem List   Diagnosis Date Noted  . Bradycardia 11/11/2016  . Cigarette smoker 12/31/2015  . Preventative health care 12/22/2014  . Advance directive discussed with patient 12/22/2014  . Idiopathic peripheral neuropathy 04/02/2007      Prior to Admission medications   Medication Sig Start Date End Date Taking? Authorizing Provider  aspirin 81 MG tablet Take 81 mg by mouth daily.      Historical Provider, MD  calcium carbonate (OS-CAL) 600 MG TABS Take 600 mg by mouth daily.      Historical Provider, MD  gabapentin (NEURONTIN) 600 MG tablet TAKE ONE TABLET BY MOUTH AT BEDTIME 02/05/16   Karie Schwalbeichard I Letvak, MD    Allergies Cymbalta [duloxetine hcl] and Penicillins    Social History Social History  Substance Use  Topics  . Smoking status: Current Every Day Smoker    Packs/day: 1.00    Types: Cigarettes  . Smokeless tobacco: Never Used     Comment: . Info given on 1-800-QUIT NOW  . Alcohol use No    Review of Systems Patient denies headaches, rhinorrhea, blurry vision, numbness, shortness of breath, chest pain, edema, cough, abdominal pain, nausea, vomiting, diarrhea, dysuria, fevers, rashes or hallucinations unless otherwise stated above in HPI. ____________________________________________   PHYSICAL EXAM:  VITAL SIGNS: Vitals:   11/15/16 2130 11/15/16 2145  BP: 131/63   Pulse: 76 86  Resp: (!) 29 18  Temp:      Constitutional: Alert and oriented. Well appearing and in no acute distress. Eyes: Conjunctivae are normal. PERRL. EOMI. Head: Atraumatic. Nose: No congestion/rhinnorhea. Mouth/Throat: Mucous membranes are moist.  Oropharynx non-erythematous. Neck: No stridor. Painless ROM. No cervical spine tenderness to palpation Hematological/Lymphatic/Immunilogical: No cervical lymphadenopathy. Cardiovascular: Normal rate, regular rhythm. Grossly normal heart sounds.  Good peripheral circulation. Respiratory: tachypnic, + use of accessory muscles, speaking in short phrases. Lungs with prolonged expiratory phase and diffuse wheeze Gastrointestinal: Soft and nontender. No distention. No abdominal bruits. No CVA tenderness. Musculoskeletal: No lower extremity tenderness nor edema.  No joint effusions. Neurologic:  Normal speech and language. No gross focal neurologic deficits are appreciated. No gait instability. Skin:  Skin is warm, dry and intact. No rash noted. Psychiatric: Mood and affect  are normal. Speech and behavior are normal.  ____________________________________________   LABS (all labs ordered are listed, but only abnormal results are displayed)  Results for orders placed or performed during the hospital encounter of 11/15/16 (from the past 24 hour(s))  Basic metabolic panel      Status: None   Collection Time: 11/15/16  7:53 PM  Result Value Ref Range   Sodium 142 135 - 145 mmol/L   Potassium 4.3 3.5 - 5.1 mmol/L   Chloride 108 101 - 111 mmol/L   CO2 27 22 - 32 mmol/L   Glucose, Bld 94 65 - 99 mg/dL   BUN 20 6 - 20 mg/dL   Creatinine, Ser 9.60 0.44 - 1.00 mg/dL   Calcium 9.5 8.9 - 45.4 mg/dL   GFR calc non Af Amer >60 >60 mL/min   GFR calc Af Amer >60 >60 mL/min   Anion gap 7 5 - 15  CBC     Status: None   Collection Time: 11/15/16  7:53 PM  Result Value Ref Range   WBC 6.7 3.6 - 11.0 K/uL   RBC 4.68 3.80 - 5.20 MIL/uL   Hemoglobin 15.5 12.0 - 16.0 g/dL   HCT 09.8 11.9 - 14.7 %   MCV 96.5 80.0 - 100.0 fL   MCH 33.1 26.0 - 34.0 pg   MCHC 34.3 32.0 - 36.0 g/dL   RDW 82.9 56.2 - 13.0 %   Platelets 178 150 - 440 K/uL  Urinalysis, Complete w Microscopic     Status: Abnormal   Collection Time: 11/15/16  7:53 PM  Result Value Ref Range   Color, Urine YELLOW (A) YELLOW   APPearance CLEAR (A) CLEAR   Specific Gravity, Urine 1.008 1.005 - 1.030   pH 5.0 5.0 - 8.0   Glucose, UA NEGATIVE NEGATIVE mg/dL   Hgb urine dipstick NEGATIVE NEGATIVE   Bilirubin Urine NEGATIVE NEGATIVE   Ketones, ur NEGATIVE NEGATIVE mg/dL   Protein, ur NEGATIVE NEGATIVE mg/dL   Nitrite NEGATIVE NEGATIVE   Leukocytes, UA NEGATIVE NEGATIVE   RBC / HPF 0-5 0 - 5 RBC/hpf   WBC, UA 0-5 0 - 5 WBC/hpf   Bacteria, UA NONE SEEN NONE SEEN   Squamous Epithelial / LPF 0-5 (A) NONE SEEN   Mucous PRESENT   Troponin I     Status: Abnormal   Collection Time: 11/15/16  7:53 PM  Result Value Ref Range   Troponin I 0.09 (HH) <0.03 ng/mL  Magnesium     Status: None   Collection Time: 11/15/16  7:53 PM  Result Value Ref Range   Magnesium 2.1 1.7 - 2.4 mg/dL   ____________________________________________  EKG My review and personal interpretation at Time: 18:59   Indication: htn  Rate: 60  Rhythm: sinus Axis: normal Other: baseline wave artifact, non specific st changes, no  STEMI ____________________________________________  RADIOLOGY  I personally reviewed all radiographic images ordered to evaluate for the above acute complaints and reviewed radiology reports and findings.  These findings were personally discussed with the patient.  Please see medical record for radiology report.  ____________________________________________   PROCEDURES  Procedure(s) performed:  Procedures    Critical Care performed: no ____________________________________________   INITIAL IMPRESSION / ASSESSMENT AND PLAN / ED COURSE  Pertinent labs & imaging results that were available during my care of the patient were reviewed by me and considered in my medical decision making (see chart for details).  DDX: Asthma, copd, CHF, pna, ptx, anemia, htnive urgency, htnive emergency  TAWAN CORKERN is a 76 y.o. who presents to the ED with shortness of breath and weakness with concern for elevated blood pressure.  Patient is AFVSS in ED. Exam as above. Given current presentation have considered the above differential.  Patient is however take With use of accessory muscles, prolonged expiratory phase and speaking in short phrases. Less consistent with CHF. No overt hypoxia or tachycardia at this time. Have a very low suspicion for pulmonary embolism. EKG does show some nonspecific changes. Will order nebulizer treatments as the patient is wheezing and is clinically presenting with COPD exacerbation. Will order steroids. We'll further evaluate for any underlying ACS or heart failure.  Clinical Course as of Nov 16 2147  Sat Nov 15, 2016  2117 Patient reassessed with persistent SOB.  Concern for underlying COPD.  We'll continue as a treatments. Based on her shortness of breath and has felt improved with multiple nebulizer treatments do feel the patient would benefit from admission to hospital for further evaluation and management.  Have discussed with the patient and available family all  diagnostics and treatments performed thus far and all questions were answered to the best of my ability. The patient demonstrates understanding and agreement with plan.   [PR]    Clinical Course User Index [PR] Willy Eddy, MD     ____________________________________________   FINAL CLINICAL IMPRESSION(S) / ED DIAGNOSES  Final diagnoses:  COPD exacerbation (HCC)  Weakness  Hypertension, unspecified type      NEW MEDICATIONS STARTED DURING THIS VISIT:  New Prescriptions   No medications on file     Note:  This document was prepared using Dragon voice recognition software and may include unintentional dictation errors.    Willy Eddy, MD 11/15/16 2152

## 2016-11-15 NOTE — ED Notes (Signed)
Trop I 0.09

## 2016-11-15 NOTE — H&P (Signed)
History and Physical   [facility] - [place] @ [facility] Admission History and Physical [employer], D.O.    Patient Name: Heidi Carroll MR#: [id number] Date of Birth: Aug 25, 1941 Date of Admission: 11/15/2016  Referring MD/NP/PA: Dr. Roxan Hockey Primary Care Physician: Tillman Abide, MD  Chief Complaint: High BP  HPI: Heidi Carroll is a 76 y.o. female with a known history of depression, neuropathy, recent admission to critical care for palpitations, near-syncope and junctional bradycardia was in a usual state of health until this morning when she reports generally feeling unwell. She took her blood pressure which was elevated in the 200 systolic over 90s diastolic. She reports associated weakness and severe shortness of breath which has been progressively worsening. She denies ever having been diagnosed with COPD, does not use oxygen at home and does not take any medications for pulmonary conditions.. She does note that she has been under increased stress recently.   Otherwise there has been no change in status. Patient has been taking medication as prescribed and there has been no recent change in medication or diet.  No recent antibiotics.  There has been no recent illness, travel or sick contacts.    Patient denies fevers/chills, weakness, dizziness, chest pain, N/V/C/D, abdominal pain, dysuria/frequency, changes in mental status.   ED Course: Patient came into the emergency department in acute respiratory distress. She received DuoNeb's, SoluMedrol and azithromycin and improved significantly.  Review of Systems:  CONSTITUTIONAL: No fever/chills, fatigue, weakness, weight gain/loss, headache. EYES: No blurry or double vision. ENT: No tinnitus, postnasal drip, redness or soreness of the oropharynx. RESPIRATORY: Positive cough, dyspnea, wheeze.  No hemoptysis.  CARDIOVASCULAR: No chest pain, palpitations, syncope, orthopnea. No lower extremity edema. Positive hypertension at  home GASTROINTESTINAL: No nausea, vomiting, abdominal pain, diarrhea, constipation.  No hematemesis, melena or hematochezia. GENITOURINARY: No dysuria, frequency, hematuria. ENDOCRINE: No polyuria or nocturia. No heat or cold intolerance. HEMATOLOGY: No anemia, bruising, bleeding. INTEGUMENTARY: No rashes, ulcers, lesions. MUSCULOSKELETAL: No arthritis, gout, dyspnea. NEUROLOGIC: No numbness, tingling, ataxia, seizure-type activity, weakness. PSYCHIATRIC: No anxiety, depression, insomnia.   Past Medical History:  Diagnosis Date  . Depression   . Neuropathy (HCC)    idiopathic    Past Surgical History:  Procedure Laterality Date  . FOOT SURGERY  1995   bilateral   . SPINAL CORD STIMULATOR IMPLANT  12/1996   didn't keep  . VAGINAL DELIVERY  1963   X 1  . VAGINAL HYSTERECTOMY  1974     reports that she has been smoking Cigarettes.  She has been smoking about 1.00 pack per day. She has never used smokeless tobacco. She reports that she does not drink alcohol or use drugs.  Allergies  Allergen Reactions  . Cymbalta [Duloxetine Hcl] Nausea Only  . Penicillins     Has patient had a PCN reaction causing immediate rash, facial/tongue/throat swelling, SOB or lightheadedness with hypotension yes Has patient had a PCN reaction causing severe rash involving mucus membranes or skin necrosis: yes Has patient had a PCN reaction that required hospitalization no Has patient had a PCN reaction occurring within the last 10 years: no If all of the above answers are "NO", then may proceed with Cephalosporin use.    Family History  Problem Relation Age of Onset  . Cancer Father   . Diabetes Maternal Grandfather    Family history has been reviewed and confirmed with patient.   Prior to Admission medications   Medication Sig Start Date End Date Taking? Authorizing Provider  aspirin 81 MG tablet Take 81 mg by mouth daily.     Yes Historical Provider, MD  calcium carbonate (OS-CAL) 600 MG  TABS Take 600 mg by mouth daily.     Yes Historical Provider, MD  gabapentin (NEURONTIN) 600 MG tablet TAKE ONE TABLET BY MOUTH AT BEDTIME 02/05/16  Yes Karie Schwalbe, MD    Physical Exam: Vitals:   11/15/16 2300 11/15/16 2315 11/15/16 2330 11/15/16 2345  BP: 129/69  124/64   Pulse: 86 82 80 80  Resp: (!) 24 (!) 22 (!) 23 (!) 23  Temp:      TempSrc:      SpO2: 93% 92% 90% 92%  Weight:      Height:        GENERAL: 76 y.o.-year-old White female patient, well-developed, well-nourished lying in the bed in no acute distress.  Pleasant and cooperative.   HEENT: Head atraumatic, normocephalic. Pupils equal, round, reactive to light and accommodation. No scleral icterus. Extraocular muscles intact. Nares are patent. Oropharynx is clear. Mucus membranes moist. NECK: Supple, full range of motion. No JVD, no bruit heard. No thyroid enlargement, no tenderness, no cervical lymphadenopathy. CHEST: Diffuse expiratory wheezes with prolonged expiratory phase. No use of accessory muscles of respiration.  No reproducible chest wall tenderness.  CARDIOVASCULAR: S1, S2 normal. No murmurs, rubs, or gallops. Cap refill <2 seconds. Pulses intact distally.  ABDOMEN: Soft, nondistended, nontender. No rebound, guarding, rigidity. Normoactive bowel sounds present in all four quadrants. No organomegaly or mass. EXTREMITIES: No pedal edema, cyanosis, or clubbing. No calf tenderness or Homan's sign.  NEUROLOGIC: The patient is alert and oriented x 3. Cranial nerves II through XII are grossly intact with no focal sensorimotor deficit. Muscle strength 5/5 in all extremities. Sensation intact. Gait not checked. PSYCHIATRIC:  Normal affect, mood, thought content. SKIN: Warm, dry, and intact without obvious rash, lesion, or ulcer.    Labs on Admission:  CBC:  Recent Labs Lab 11/11/16 1126 11/12/16 0034 11/15/16 1953  WBC 11.0 8.5 6.7  HGB 18.0* 14.9 15.5  HCT 53.7* 44.1 45.1  MCV 98.6 97.5 96.5  PLT 236 184  178   Basic Metabolic Panel:  Recent Labs Lab 11/11/16 1126 11/11/16 2021 11/12/16 0034 11/15/16 1953  NA 138 139 138 142  K 5.1 4.6 3.6 4.3  CL 102 107 106 108  CO2 28 24 26 27   GLUCOSE 123* 110* 104* 94  BUN 21* 27* 25* 20  CREATININE 0.94 0.71 0.73 0.66  CALCIUM 9.5 9.0 8.6* 9.5  MG  --   --  1.8 2.1  PHOS  --   --  3.0  --    GFR: Estimated Creatinine Clearance: 57 mL/min (by C-G formula based on SCr of 0.66 mg/dL). Liver Function Tests:  Recent Labs Lab 11/11/16 1148 11/11/16 2021  AST 24 28  ALT 14 12*  ALKPHOS 63 58  BILITOT 0.5 1.1  PROT 6.6 6.1*  ALBUMIN 3.9 3.5   No results for input(s): LIPASE, AMYLASE in the last 168 hours. No results for input(s): AMMONIA in the last 168 hours. Coagulation Profile:  Recent Labs Lab 11/11/16 1148  INR 0.97   Cardiac Enzymes:  Recent Labs Lab 11/11/16 1126 11/12/16 0034 11/15/16 1953  TROPONINI 0.27* 0.97* 0.09*   BNP (last 3 results) No results for input(s): PROBNP in the last 8760 hours. HbA1C: No results for input(s): HGBA1C in the last 72 hours. CBG:  Recent Labs Lab 11/11/16 2019  GLUCAP 106*   Lipid Profile:  No results for input(s): CHOL, HDL, LDLCALC, TRIG, CHOLHDL, LDLDIRECT in the last 72 hours. Thyroid Function Tests: No results for input(s): TSH, T4TOTAL, FREET4, T3FREE, THYROIDAB in the last 72 hours. Anemia Panel: No results for input(s): VITAMINB12, FOLATE, FERRITIN, TIBC, IRON, RETICCTPCT in the last 72 hours. Urine analysis:    Component Value Date/Time   COLORURINE YELLOW (A) 11/15/2016 1953   APPEARANCEUR CLEAR (A) 11/15/2016 1953   LABSPEC 1.008 11/15/2016 1953   PHURINE 5.0 11/15/2016 1953   GLUCOSEU NEGATIVE 11/15/2016 1953   HGBUR NEGATIVE 11/15/2016 1953   BILIRUBINUR NEGATIVE 11/15/2016 1953   KETONESUR NEGATIVE 11/15/2016 1953   PROTEINUR NEGATIVE 11/15/2016 1953   NITRITE NEGATIVE 11/15/2016 1953   LEUKOCYTESUR NEGATIVE 11/15/2016 1953   Sepsis  Labs: @LABRCNTIP (procalcitonin:4,lacticidven:4) ) Recent Results (from the past 240 hour(s))  MRSA PCR Screening     Status: None   Collection Time: 11/11/16  8:11 PM  Result Value Ref Range Status   MRSA by PCR NEGATIVE NEGATIVE Final    Comment:        The GeneXpert MRSA Assay (FDA approved for NASAL specimens only), is one component of a comprehensive MRSA colonization surveillance program. It is not intended to diagnose MRSA infection nor to guide or monitor treatment for MRSA infections.      Radiological Exams on Admission: Dg Chest 2 View  Result Date: 11/15/2016 CLINICAL DATA:  Patient noted she had high blood pressure and heart rate on her home monitor today. No other symptoms. She was seen in the ED 1/16 and a cardiologist 1/18 Hx neuropathy, smoker, spinal cord stimulator 1998 EXAM: CHEST  2 VIEW COMPARISON:  11/11/2016 FINDINGS: Normal mediastinum and cardiac silhouette. Normal pulmonary vasculature. No evidence of effusion, infiltrate, or pneumothorax. No acute bony abnormality. Hyperinflated lungs. IMPRESSION: Hyperinflated lungs.  No acute findings Electronically Signed   By: Genevive Bi M.D.   On: 11/15/2016 20:46    EKG: Normal sinus rhythm at 60 bpm with normal axis and nonspecific ST-T wave changes.   Assessment/Plan Active Problems:   COPD exacerbation (HCC)    This is a 76 y.o. female with a history of depression, neuropathy, bradycardia now being admitted with: 1. COPD exacerbation. Patient does not have a formal diagnosis of COPD however her presentation, x-ray findings are consistent with underlying emphysema. We will admit her to the hospital for nebulizers, steroids, Azithromycin, O2, expectorants. Follow-up sputum culture and flu swab. She will need a pulmonary workup including PFTs as an outpatient. An sitter inpatient pulmonary consult if not improving. 2. Elevated troponin - was elevated during prior hospitalization to peak of 0.97, is now 0.09.  May be trending down versus elevated secondary to demand ischemia given #1. We'll monitor on telemetry and trend troponins. 3. Hypertensive urgency-BP has normalized in the emergency department. We'll continue to monitor. 4. History of peripheral neuropathy-continue Neurontin Patient to continue aspirin and calcium  Admission status: Inpatient  IV Fluids: NS Diet/Nutrition: Heart healthy Consults called: None  DVT Px: Lovenox, SCDs and early ambulation. Code Status: Full Code  Disposition Plan: To home in 1-2 days   All the records are reviewed and case discussed with ED provider. Management plans discussed with the patient and/or family who express understanding and agree with plan of care.  Jeniece Hannis D.O. on 11/16/2016 at 12:01 AM Between 7am to 6pm - Pager - [phone] After 6pm go to [email or web address] - Social research officer, government [employer]  Hospitalists Office [phone] CC: Primary care physician; Tillman Abide, MD  11/16/2016, 12:01 AM

## 2016-11-16 DIAGNOSIS — F17210 Nicotine dependence, cigarettes, uncomplicated: Secondary | ICD-10-CM | POA: Diagnosis not present

## 2016-11-16 DIAGNOSIS — Z833 Family history of diabetes mellitus: Secondary | ICD-10-CM | POA: Diagnosis not present

## 2016-11-16 DIAGNOSIS — Z809 Family history of malignant neoplasm, unspecified: Secondary | ICD-10-CM | POA: Diagnosis not present

## 2016-11-16 DIAGNOSIS — R531 Weakness: Secondary | ICD-10-CM | POA: Diagnosis not present

## 2016-11-16 DIAGNOSIS — F329 Major depressive disorder, single episode, unspecified: Secondary | ICD-10-CM | POA: Diagnosis not present

## 2016-11-16 DIAGNOSIS — J441 Chronic obstructive pulmonary disease with (acute) exacerbation: Secondary | ICD-10-CM | POA: Diagnosis not present

## 2016-11-16 DIAGNOSIS — I10 Essential (primary) hypertension: Secondary | ICD-10-CM | POA: Diagnosis not present

## 2016-11-16 LAB — BASIC METABOLIC PANEL
Anion gap: 8 (ref 5–15)
BUN: 18 mg/dL (ref 6–20)
CALCIUM: 8.8 mg/dL — AB (ref 8.9–10.3)
CO2: 25 mmol/L (ref 22–32)
CREATININE: 0.75 mg/dL (ref 0.44–1.00)
Chloride: 108 mmol/L (ref 101–111)
GFR calc Af Amer: >60 mL/min (ref >60)
GFR calc non Af Amer: >60 mL/min (ref >60)
Glucose, Bld: 171 mg/dL — ABNORMAL HIGH (ref 65–99)
Potassium: 3.6 mmol/L (ref 3.5–5.1)
SODIUM: 141 mmol/L (ref 135–145)

## 2016-11-16 LAB — CBC
HCT: 43.7 % (ref 35.0–47.0)
Hemoglobin: 15.1 g/dL (ref 12.0–16.0)
MCH: 33.5 pg (ref 26.0–34.0)
MCHC: 34.6 g/dL (ref 32.0–36.0)
MCV: 96.8 fL (ref 80.0–100.0)
PLATELETS: 171 10*3/uL (ref 150–440)
RBC: 4.52 MIL/uL (ref 3.80–5.20)
RDW: 12.8 % (ref 11.5–14.5)
WBC: 5 10*3/uL (ref 3.6–11.0)

## 2016-11-16 LAB — INFLUENZA PANEL BY PCR (TYPE A & B)
INFLAPCR: NEGATIVE
Influenza B By PCR: NEGATIVE

## 2016-11-16 LAB — TROPONIN I
TROPONIN I: 0.04 ng/mL — AB (ref <0.03)
Troponin I: 0.05 ng/mL (ref <0.03)

## 2016-11-16 MED ORDER — BISACODYL 5 MG PO TBEC: 5.0000 mg | DELAYED_RELEASE_TABLET | Freq: Every day | ORAL | Status: DC | PRN

## 2016-11-16 MED ORDER — PREDNISONE 50 MG PO TABS: 50.0000 mg | ORAL_TABLET | Freq: Every day | ORAL | Status: DC

## 2016-11-16 MED ORDER — SENNOSIDES-DOCUSATE SODIUM 8.6-50 MG PO TABS: 1.0000 | ORAL_TABLET | Freq: Every evening | ORAL | Status: DC | PRN

## 2016-11-16 MED ORDER — GUAIFENESIN ER 600 MG PO TB12: 600.0000 mg | ORAL_TABLET | Freq: Two times a day (BID) | ORAL | Status: DC

## 2016-11-16 MED ORDER — CALCIUM CARBONATE ANTACID 500 MG PO CHEW
1500.0000 mg | CHEWABLE_TABLET | Freq: Every day | ORAL | Status: DC
Start: 2016-11-16 — End: 2016-11-16

## 2016-11-16 MED ORDER — METHYLPREDNISOLONE SODIUM SUCC 125 MG IJ SOLR: 60.0000 mg | Freq: Every day | INTRAMUSCULAR | Status: DC

## 2016-11-16 MED ORDER — METHYLPREDNISOLONE SODIUM SUCC 125 MG IJ SOLR
60.0000 mg | Freq: Four times a day (QID) | INTRAMUSCULAR | Status: DC
  Administered 2016-11-16: 60 mg via INTRAVENOUS
  Filled 2016-11-16: qty 2

## 2016-11-16 MED ORDER — DEXTROSE 5 % IV SOLN
500.0000 mg | INTRAVENOUS | Status: DC
  Filled 2016-11-16: qty 500

## 2016-11-16 MED ORDER — GABAPENTIN 600 MG PO TABS: 600.0000 mg | ORAL_TABLET | Freq: Every day | ORAL | Status: DC

## 2016-11-16 MED ORDER — DEXTROMETHORPHAN POLISTIREX ER 30 MG/5ML PO SUER
30.0000 mg | Freq: Two times a day (BID) | ORAL | Status: DC
  Filled 2016-11-16 (×2): qty 5

## 2016-11-16 MED ORDER — ASPIRIN EC 81 MG PO TBEC
81.0000 mg | DELAYED_RELEASE_TABLET | Freq: Every day | ORAL | Status: DC
Start: 2016-11-16 — End: 2016-11-16

## 2016-11-16 MED ORDER — PREDNISONE 10 MG PO TABS: ORAL_TABLET | ORAL | 0 refills | Status: DC

## 2016-11-16 MED ORDER — DM-GUAIFENESIN ER 30-600 MG PO TB12: 1.0000 | ORAL_TABLET | Freq: Two times a day (BID) | ORAL | Status: DC

## 2016-11-16 MED ORDER — IPRATROPIUM BROMIDE 0.02 % IN SOLN: 0.5000 mg | Freq: Four times a day (QID) | RESPIRATORY_TRACT | Status: DC | PRN

## 2016-11-16 MED ORDER — ALBUTEROL SULFATE HFA 108 (90 BASE) MCG/ACT IN AERS
2.0000 | INHALATION_SPRAY | Freq: Four times a day (QID) | RESPIRATORY_TRACT | Status: DC | PRN
  Filled 2016-11-16: qty 6.7

## 2016-11-16 MED ORDER — ACETAMINOPHEN 650 MG RE SUPP: 650.0000 mg | Freq: Four times a day (QID) | RECTAL | Status: DC | PRN

## 2016-11-16 MED ORDER — ALBUTEROL SULFATE (2.5 MG/3ML) 0.083% IN NEBU
2.5000 mg | INHALATION_SOLUTION | Freq: Four times a day (QID) | RESPIRATORY_TRACT | Status: DC | PRN
Start: 2016-11-16 — End: 2016-11-16

## 2016-11-16 MED ORDER — ALBUTEROL SULFATE HFA 108 (90 BASE) MCG/ACT IN AERS: 2.0000 | INHALATION_SPRAY | Freq: Four times a day (QID) | RESPIRATORY_TRACT | 0 refills | Status: DC | PRN

## 2016-11-16 MED ORDER — OXYCODONE HCL 5 MG PO TABS: 5.0000 mg | ORAL_TABLET | ORAL | Status: DC | PRN

## 2016-11-16 MED ORDER — ACETAMINOPHEN 325 MG PO TABS: 650.0000 mg | ORAL_TABLET | Freq: Four times a day (QID) | ORAL | Status: DC | PRN

## 2016-11-16 MED ORDER — MAGNESIUM CITRATE PO SOLN: 1.0000 | Freq: Once | ORAL | Status: DC | PRN

## 2016-11-16 MED ORDER — SODIUM CHLORIDE 0.9 % IV SOLN
INTRAVENOUS | Status: DC
  Administered 2016-11-16: 02:00:00 via INTRAVENOUS

## 2016-11-16 MED ORDER — ENOXAPARIN SODIUM 40 MG/0.4ML ~~LOC~~ SOLN
40.0000 mg | Freq: Every day | SUBCUTANEOUS | Status: DC
  Administered 2016-11-16: 40 mg via SUBCUTANEOUS

## 2016-11-16 MED ORDER — ONDANSETRON HCL 4 MG PO TABS: 4.0000 mg | ORAL_TABLET | Freq: Four times a day (QID) | ORAL | Status: DC | PRN

## 2016-11-16 MED ORDER — ONDANSETRON HCL 4 MG/2ML IJ SOLN: 4.0000 mg | Freq: Four times a day (QID) | INTRAMUSCULAR | Status: DC | PRN

## 2016-11-16 NOTE — Discharge Instructions (Signed)
Keep log of BP at home °

## 2016-11-16 NOTE — Evaluation (Signed)
Physical Therapy Evaluation Patient Details Name: Heidi Carroll MRN: [id number] DOB: 1941-06-17 Today's Date: 11/16/2016   History of Present Illness   76 y.o. female with history of depression, neuropathy, recent admission to critical care for palpitations, near-syncope and junctional bradycardia, now here reporting generally feeling unwell and having some breathing difficulty.  Admitted with COPD exacerbation.  Clinical Impression  Pt generally did well with PT and showed no issues that would keep her from going home safely.  She did have some fatigue with ~200 ft of ambulation but on room air her O2 remained in the mid 90s and she had consistent, confidence and appropriate gait w/o AD.  Pt eager to get home and reports that she has good neighbors who will be able to help her if she needs a little assistance initially.  Pt does not have any further PT needs.     Follow Up Recommendations No PT follow up    Equipment Recommendations       Recommendations for Other Services       Precautions / Restrictions Restrictions Weight Bearing Restrictions: No      Mobility  Bed Mobility Overal bed mobility: Independent             General bed mobility comments: Pt able to easily get to EOB w/o assist  Transfers Overall transfer level: Independent Equipment used: None             General transfer comment: Pt rises to standing with good confidence and no safe issues.   Ambulation/Gait Ambulation/Gait assistance: Independent Ambulation Distance (Feet): 200 Feet Assistive device: None       General Gait Details: Pt able to circumambuate the nurses' station with no AD, no hesitation and no safety issues.  O2 remained in the high/mod 90s on room air t/o session.  Stairs            Wheelchair Mobility    Modified Rankin (Stroke Patients Only)       Balance Overall balance assessment: Independent                                            Pertinent Vitals/Pain Pain Assessment: No/denies pain    Home Living Family/patient expects to be discharged to:: Private residence Living Arrangements: Alone Available Help at Discharge: Neighbor   Home Access: Ramped entrance       Home Equipment: Dan HumphreysWalker - 2 wheels;Cane - single point;Grab bars - toilet;Grab bars - tub/shower;Wheelchair - manual      Prior Function Level of Independence: Independent         Comments: Pt is typically able to do all she needs, no issues with running errands, maintaining home, etc     Hand Dominance        Extremity/Trunk Assessment   Upper Extremity Assessment Upper Extremity Assessment: Overall WFL for tasks assessed    Lower Extremity Assessment Lower Extremity Assessment: Overall WFL for tasks assessed       Communication   Communication: No difficulties  Cognition Arousal/Alertness: Awake/alert Behavior During Therapy: WFL for tasks assessed/performed Overall Cognitive Status: Within Functional Limits for tasks assessed                      General Comments      Exercises     Assessment/Plan    PT Assessment Patent does not need  any further PT services  PT Problem List            PT Treatment Interventions      PT Goals (Current goals can be found in the Care Plan section)  Acute Rehab PT Goals Patient Stated Goal: go home    Frequency     Barriers to discharge        Co-evaluation               End of Session Equipment Utilized During Treatment: Gait belt Activity Tolerance: Patient tolerated treatment well Patient left: in bed;with call bell/phone within reach           Time: 0926-0940 PT Time Calculation (min) (ACUTE ONLY): 14 min   Charges:   PT Evaluation $PT Eval Low Complexity: 1 Procedure     PT G CodesMalachi Pro, DPT 11/16/2016, 10:08 AM

## 2016-11-16 NOTE — Discharge Summary (Signed)
[facility] [place] [facility]   PATIENT NAME: Heidi Carroll    MR#:  [id number]  DATE OF BIRTH:  1941-06-12  DATE OF ADMISSION:  11/15/2016 ADMITTING PHYSICIAN: Tonye Royalty, DO  DATE OF DISCHARGE:11/16/16  PRIMARY CARE PHYSICIAN: Tillman Abide, MD    ADMISSION DIAGNOSIS:  Weakness [R53.1] COPD exacerbation (HCC) [J44.1] Hypertension, unspecified type [I10]  DISCHARGE DIAGNOSIS:  COPD exacerbation (new) Transient elevated bP-resolved  SECONDARY DIAGNOSIS:   Past Medical History:  Diagnosis Date  . Depression   . Neuropathy (HCC)    idiopathic    HOSPITAL COURSE:  76 y.o. female with a history of depression, neuropathy, bradycardia now being admitted with: 1. COPD exacerbation. Patient does not have a formal diagnosis of COPD however her presentation, x-ray findings are consistent with underlying emphysema.  -prn nebulizers, steroids,O2, expectorants.  -on RA and feels backt o baseline 2. Elevated troponin - was elevated during prior hospitalization to peak of 0.97, is now 0.09. May be trending down versus elevated secondary to demand ischemia given #1.  -no CP 3. Hypertensive urgency-BP has normalized in the emergency department. We'll continue to monitor. -normalized. Will not start on any po meds. Pt advised to keep log of bp at home  4. History of peripheral neuropathy-continue Neurontin Patient to continue aspirin and calcium  5. Tobacco abuse. Advised cessation. Pt agreeable. >4 mins spent in counsellling  Overall stable D/c home  CONSULTS OBTAINED:    DRUG ALLERGIES:   Allergies  Allergen Reactions  . Cymbalta [Duloxetine Hcl] Nausea Only  . Penicillins     Has patient had a PCN reaction causing immediate rash, facial/tongue/throat swelling, SOB or lightheadedness with hypotension yes Has patient had a PCN reaction causing severe rash involving mucus membranes or skin necrosis: yes Has patient had a PCN reaction  that required hospitalization no Has patient had a PCN reaction occurring within the last 10 years: no If all of the above answers are "NO", then may proceed with Cephalosporin use.    DISCHARGE MEDICATIONS:   Current Discharge Medication List    START taking these medications   Details  albuterol (PROVENTIL HFA;VENTOLIN HFA) 108 (90 Base) MCG/ACT inhaler Inhale 2 puffs into the lungs every 6 (six) hours as needed for wheezing or shortness of breath. Qty: 1 Inhaler, Refills: 0    predniSONE (DELTASONE) 10 MG tablet Take 50 mg daily taper by 10 mg daily then stop Qty: 15 tablet, Refills: 0      CONTINUE these medications which have NOT CHANGED   Details  aspirin 81 MG tablet Take 81 mg by mouth daily.      calcium carbonate (OS-CAL) 600 MG TABS Take 600 mg by mouth daily.      gabapentin (NEURONTIN) 600 MG tablet TAKE ONE TABLET BY MOUTH AT BEDTIME Qty: 90 tablet, Refills: 3        If you experience worsening of your admission symptoms, develop shortness of breath, life threatening emergency, suicidal or homicidal thoughts you must seek medical attention immediately by calling 911 or calling your MD immediately  if symptoms less severe.  You Must read complete instructions/literature along with all the possible adverse reactions/side effects for all the Medicines you take and that have been prescribed to you. Take any new Medicines after you have completely understood and accept all the possible adverse reactions/side effects.   Please note  You were cared for by a hospitalist during your hospital stay. If you have any questions about your discharge  medications or the care you received while you were in the hospital after you are discharged, you can call the unit and asked to speak with the hospitalist on call if the hospitalist that took care of you is not available. Once you are discharged, your primary care physician will handle any further medical issues. Please note that NO  REFILLS for any discharge medications will be authorized once you are discharged, as it is imperative that you return to your primary care physician (or establish a relationship with a primary care physician if you do not have one) for your aftercare needs so that they can reassess your need for medications and monitor your lab values. Today   SUBJECTIVE  Doing well Wants to go home   VITAL SIGNS:  Blood pressure (!) 129/45, pulse (!) 56, temperature 98.2 F (36.8 C), temperature source Oral, resp. rate 18, height 5\' 3"  (1.6 m), weight 69.5 kg (153 lb 4.8 oz), SpO2 95 %.  I/O:  No intake or output data in the 24 hours ending 11/16/16 0840  PHYSICAL EXAMINATION:  GENERAL:  76 y.o.-year-old patient lying in the bed with no acute distress.  EYES: Pupils equal, round, reactive to light and accommodation. No scleral icterus. Extraocular muscles intact.  HEENT: Head atraumatic, normocephalic. Oropharynx and nasopharynx clear.  NECK:  Supple, no jugular venous distention. No thyroid enlargement, no tenderness.  LUNGS: Normal breath sounds bilaterally, no wheezing, rales,rhonchi or crepitation. No use of accessory muscles of respiration.  CARDIOVASCULAR: S1, S2 normal. No murmurs, rubs, or gallops.  ABDOMEN: Soft, non-tender, non-distended. Bowel sounds present. No organomegaly or mass.  EXTREMITIES: No pedal edema, cyanosis, or clubbing.  NEUROLOGIC: Cranial nerves II through XII are intact. Muscle strength 5/5 in all extremities. Sensation intact. Gait not checked.  PSYCHIATRIC: The patient is alert and oriented x 3.  SKIN: No obvious rash, lesion, or ulcer.   DATA REVIEW:   CBC   Recent Labs Lab 11/16/16 0231  WBC 5.0  HGB 15.1  HCT 43.7  PLT 171    Chemistries   Recent Labs Lab 11/11/16 2021  11/15/16 1953 11/16/16 0231  NA 139  < > 142 141  K 4.6  < > 4.3 3.6  CL 107  < > 108 108  CO2 24  < > 27 25  GLUCOSE 110*  < > 94 171*  BUN 27*  < > 20 18  CREATININE 0.71  < >  0.66 0.75  CALCIUM 9.0  < > 9.5 8.8*  MG  --   < > 2.1  --   AST 28  --   --   --   ALT 12*  --   --   --   ALKPHOS 58  --   --   --   BILITOT 1.1  --   --   --   < > = values in this interval not displayed.  Microbiology Results   Recent Results (from the past 240 hour(s))  MRSA PCR Screening     Status: None   Collection Time: 11/11/16  8:11 PM  Result Value Ref Range Status   MRSA by PCR NEGATIVE NEGATIVE Final    Comment:        The GeneXpert MRSA Assay (FDA approved for NASAL specimens only), is one component of a comprehensive MRSA colonization surveillance program. It is not intended to diagnose MRSA infection nor to guide or monitor treatment for MRSA infections.     RADIOLOGY:  Dg Chest 2  View  Result Date: 11/15/2016 CLINICAL DATA:  Patient noted she had high blood pressure and heart rate on her home monitor today. No other symptoms. She was seen in the ED 1/16 and a cardiologist 1/18 Hx neuropathy, smoker, spinal cord stimulator 1998 EXAM: CHEST  2 VIEW COMPARISON:  11/11/2016 FINDINGS: Normal mediastinum and cardiac silhouette. Normal pulmonary vasculature. No evidence of effusion, infiltrate, or pneumothorax. No acute bony abnormality. Hyperinflated lungs. IMPRESSION: Hyperinflated lungs.  No acute findings Electronically Signed   By: Genevive Bi M.D.   On: 11/15/2016 20:46     Management plans discussed with the patient, family and they are in agreement.  CODE STATUS:     Code Status Orders        Start     Ordered   11/16/16 0131  Full code  Continuous     11/16/16 0131    Code Status History    Date Active Date Inactive Code Status Order ID Comments User Context   11/11/2016  2:36 PM 11/13/2016  3:25 PM Full Code [id number]  Shane Crutch, MD ED    Advance Directive Documentation   Flowsheet Row Most Recent Value  Type of Advance Directive  Healthcare Power of Attorney, Living will  Pre-existing out of facility DNR order (yellow form or  pink MOST form)  No data  "MOST" Form in Place?  No data      TOTAL TIME TAKING CARE OF THIS PATIENT: 40 minutes.    Amunique Neyra M.D on 11/16/2016 at 8:40 AM  Between 7am to 6pm - Pager - [phone] After 6pm go to [email or web address] - password [email or web address] [facility]  [facility]  Office  [phone]  CC: Primary care physician; Tillman Abide, MD

## 2016-11-16 NOTE — Progress Notes (Signed)
[name] to be D/C'd Home per MD order.  Discussed with the patient and all questions fully answered.  VSS, Skin clean, dry and intact without evidence of skin break down, no evidence of skin tears noted. IV catheter discontinued intact. Site without signs and symptoms of complications. Dressing and pressure applied.  An After Visit Summary was printed and given to the patient. Patient received prescription.  D/c education completed with patient/family including follow up instructions, medication list, d/c activities limitations if indicated, with other d/c instructions as indicated by MD - patient able to verbalize understanding, all questions fully answered.   Patient instructed to return to ED, call 911, or call MD for any changes in condition.   Patient ambulated out to private auto.   Harvie HeckMelanie Gurtaj Ruz 11/16/2016 10:27 AM

## 2016-11-17 ENCOUNTER — Telehealth: Payer: Self-pay | Admitting: *Deleted

## 2016-11-17 NOTE — Telephone Encounter (Signed)
Transition Care Management Follow-up Telephone Call   Date discharged? 11/13/16   How have you been since you were released from the hospital? Pretty good   Do you understand why you were in the hospital? yes, "heart racing"   Do you understand the discharge instructions? Yes, but would like to discuss Prednisone with Dr.Letvak at appt.   Where were you discharged to? Home   Items Reviewed:  Medications reviewed: yes  Allergies reviewed: yes  Dietary changes reviewed: yes  Referrals reviewed: yes   Functional Questionnaire:   Activities of Daily Living (ADLs):   She states they are independent in the following: ambulation, bathing and hygiene, feeding, continence, grooming, toileting and dressing States they require assistance with the following: n/a   Any transportation issues/concerns?: no   Any patient concerns? yes, want to discussing more with Dr.Letvak about why she was in the hospital and would like any assistance to stop smoking.   Confirmed importance and date/time of follow-up visits scheduled yes  Provider Appointment booked with Dr. Alphonsus SiasLetvak 11/18/16 @1215 .  Confirmed with patient if condition begins to worsen call PCP or go to the ER.  Patient was given the office number and encouraged to call back with question or concerns.  : yes

## 2016-11-18 ENCOUNTER — Encounter: Payer: Self-pay | Admitting: Internal Medicine

## 2016-11-18 ENCOUNTER — Ambulatory Visit (INDEPENDENT_AMBULATORY_CARE_PROVIDER_SITE_OTHER): Payer: Medicare Other | Admitting: Internal Medicine

## 2016-11-18 VITALS — BP 128/76 | HR 60 | Temp 98.2°F | Wt 149.0 lb

## 2016-11-18 DIAGNOSIS — I10 Essential (primary) hypertension: Secondary | ICD-10-CM | POA: Insufficient documentation

## 2016-11-18 DIAGNOSIS — F39 Unspecified mood [affective] disorder: Secondary | ICD-10-CM | POA: Insufficient documentation

## 2016-11-18 DIAGNOSIS — J439 Emphysema, unspecified: Secondary | ICD-10-CM

## 2016-11-18 DIAGNOSIS — J449 Chronic obstructive pulmonary disease, unspecified: Secondary | ICD-10-CM | POA: Insufficient documentation

## 2016-11-18 DIAGNOSIS — R030 Elevated blood-pressure reading, without diagnosis of hypertension: Secondary | ICD-10-CM | POA: Diagnosis not present

## 2016-11-18 MED ORDER — BUPROPION HCL ER (XL) 150 MG PO TB24: 150.0000 mg | ORAL_TABLET | Freq: Every day | ORAL | 3 refills | Status: DC

## 2016-11-18 NOTE — Progress Notes (Signed)
Pre visit review using our clinic review tool, if applicable. No additional management support is needed unless otherwise documented below in the visit note. 

## 2016-11-18 NOTE — Patient Instructions (Addendum)
Please call the smoking hotline-- 1-800 QUIT NOW. Start a nicotine patch every day (21 or 22mg ). Put it on in the morning and replace every morning. If it keeps you from sleeping (like with dreams), take it off at bedtime. Please find a volunteer position or help someone in their home.

## 2016-11-18 NOTE — Assessment & Plan Note (Signed)
Seems to have been secondary to her mood issues

## 2016-11-18 NOTE — Assessment & Plan Note (Signed)
I don't think this is exacerbated Will work for cigarette cessation Albuterol prn only

## 2016-11-18 NOTE — Assessment & Plan Note (Signed)
Grieving and ongoing dysthymia Needs purpose--will pursue volunteer work Discussed counseling--will hold off Start bupropion--for smoking cessation also

## 2016-11-18 NOTE — Progress Notes (Signed)
Subjective:    Patient ID: Heidi Carroll, female    DOB: [birth year], 76 y.o.   MRN: [id number]  HPI Here for hospital follow up Records reviewed New official diagnosis of COPD--though I have felt this was present without symptoms in past She did wind up being admitted overnight  She went to the ER--- because BP 167/90, then 176/94 Went to EMTs and BP 202/104 They brought her to the ER  No headache Didn't feel right--but didn't know what was wrong No chest pain Breathing wasn't changed-- not great but no change No cough or fever Didn't fill the prednisone Using the albuterol reguarly--was told every 6 hours Had some heart racing--this is gone  Now feels better-- "wrong feeling persists" but she thinks it is due to the uncertainty about what happened  Does plan to stop smoking today  Current Outpatient Prescriptions on File Prior to Visit  Medication Sig Dispense Refill  . albuterol (PROVENTIL HFA;VENTOLIN HFA) 108 (90 Base) MCG/ACT inhaler Inhale 2 puffs into the lungs every 6 (six) hours as needed for wheezing or shortness of breath. 1 Inhaler 0  . aspirin 81 MG tablet Take 81 mg by mouth daily.      . calcium carbonate (OS-CAL) 600 MG TABS Take 600 mg by mouth daily.      Marland Kitchen gabapentin (NEURONTIN) 600 MG tablet TAKE ONE TABLET BY MOUTH AT BEDTIME 90 tablet 3   No current facility-administered medications on file prior to visit.     Allergies  Allergen Reactions  . Cymbalta [Duloxetine Hcl] Nausea Only  . Penicillins     Has patient had a PCN reaction causing immediate rash, facial/tongue/throat swelling, SOB or lightheadedness with hypotension yes Has patient had a PCN reaction causing severe rash involving mucus membranes or skin necrosis: yes Has patient had a PCN reaction that required hospitalization no Has patient had a PCN reaction occurring within the last 10 years: no If all of the above answers are "NO", then may proceed with Cephalosporin use.    Past  Medical History:  Diagnosis Date  . Depression   . Neuropathy (HCC)    idiopathic    Past Surgical History:  Procedure Laterality Date  . FOOT SURGERY  1995   bilateral   . SPINAL CORD STIMULATOR IMPLANT  12/1996   didn't keep  . VAGINAL DELIVERY  1963   X 1  . VAGINAL HYSTERECTOMY  1974    Family History  Problem Relation Age of Onset  . Cancer Father   . Diabetes Maternal Grandfather     Social History   Social History  . Marital status: Widowed    Spouse name: N/A  . Number of children: 1  . Years of education: N/A   Occupational History  . Retired, [employer])    Social History Main Topics  . Smoking status: Current Every Day Smoker    Packs/day: 1.00    Types: Cigarettes  . Smokeless tobacco: Never Used     Comment: . Info given on 1-800-QUIT NOW  . Alcohol use No  . Drug use: No  . Sexual activity: Not on file   Other Topics Concern  . Not on file   Social History Narrative   Husband died Aug 28, 2023      Has living will   Has health care POA--- Chaya Jan, friend   Requests DNR--- form done 12/22/14   No tube feedings if cognitively unaware   Review of Systems 2 panic attacks while  in hospital--had to be walked around to calm down Overall not doing so well since husband's death Just sits at computer and smokes Does have anhedonia Lost dog--that made things worse    Objective:   Physical Exam  Constitutional: She appears well-nourished. No distress.  Neck: No thyromegaly present.  Cardiovascular: Normal rate, regular rhythm and normal heart sounds.  Exam reveals no gallop.   No murmur heard. Pulmonary/Chest: She has no wheezes. She has no rales.  Decreased breath sounds but clear  Musculoskeletal: She exhibits no edema.  Lymphadenopathy:    She has no cervical adenopathy.  Psychiatric:  Mild depression but fair insight           Assessment & Plan:

## 2016-11-20 ENCOUNTER — Ambulatory Visit (INDEPENDENT_AMBULATORY_CARE_PROVIDER_SITE_OTHER): Payer: Medicare Other | Admitting: Family Medicine

## 2016-11-20 ENCOUNTER — Encounter: Payer: Self-pay | Admitting: Family Medicine

## 2016-11-20 ENCOUNTER — Telehealth: Payer: Self-pay

## 2016-11-20 VITALS — BP 140/72 | HR 56 | Temp 97.6°F | Wt 147.0 lb

## 2016-11-20 DIAGNOSIS — F39 Unspecified mood [affective] disorder: Secondary | ICD-10-CM

## 2016-11-20 DIAGNOSIS — R030 Elevated blood-pressure reading, without diagnosis of hypertension: Secondary | ICD-10-CM

## 2016-11-20 NOTE — Patient Instructions (Addendum)
Great to meet you. Please stop taking wellbutrin.  Please make an appointment to see Dr. Alphonsus SiasLetvak tomorrow.

## 2016-11-20 NOTE — Assessment & Plan Note (Addendum)
?   Reaction to wellbutrin along with some anxiety that she has regarding new medications and smoking cessation. BP at recheck was 140/72. Reassurance provided.  Advised to STOP taking Wellbutrin.  Please make an appointment to see Dr. Alphonsus SiasLetvak tomorrow.

## 2016-11-20 NOTE — Telephone Encounter (Signed)
I spoke with pt and this morning her BP was 175/81. No H/A, dizziness or CP. Pt said taking the bupropion is only new thing pt has done. Pt scheduled appt with Dr Dayton MartesAron 11/20/16 at 10:45. FYI to Dr Dayton MartesAron.

## 2016-11-20 NOTE — Progress Notes (Signed)
Pre visit review using our clinic review tool, if applicable. No additional management support is needed unless otherwise documented below in the visit note. 

## 2016-11-20 NOTE — Telephone Encounter (Signed)
PLEASE NOTE: All timestamps contained within this report are represented as [place] Standard Time. CONFIDENTIALTY NOTICE: This fax transmission is intended only for the addressee. It contains information that is legally privileged, confidential or otherwise protected from use or disclosure. If you are not the intended recipient, you are strictly prohibited from reviewing, disclosing, copying using or disseminating any of this information or taking any action in reliance on or regarding this information. If you have received this fax in error, please notify us immediately by telephone so that we can arrange for its return to [place]. Phone: [phone], Toll-Free: [phone], Fax: [phone] Page: 1 of 2 Call Id: [id number] [facility] - Client TELEPHONE ADVICE RECORD [facility] Call Center Patient Name: Heidi Carroll Gender: Female DOB: 1940/12/31 Age: 76 Y 5 M 28 D Return Phone Number: [phone] (Primary) City/State/Zip: [place] Client [facility] - Client Client Site [facility] - Day Physician Tillman Abide - MD Who Is Calling Patient / Member / Family / Caregiver Call Type Triage / Clinical Relationship To Patient Self Return Phone Number [phone] (Primary) Chief Complaint Blood Pressure High Reason for Call Symptomatic / Request for Health Information Initial Comment Caller States their blood pressure is high. Appointment Disposition EMR Appointment Not Necessary Info pasted into Epic No Nurse Assessment Nurse: Trudie Reed, RN, Tabatha Date/Time Lamount Cohen Time): 11/19/2016 9:15:18 PM Confirm and document reason for call. If symptomatic, describe symptoms. ---Caller states her bp is 181/87 Does the PT have any chronic conditions? (i.e. diabetes, asthma, etc.) ---Yes List chronic conditions. ---hyertension Guidelines Guideline Title Affirmed Question High Blood Pressure [1] Taking BP medications AND  [2] feels is having side effects (e.g., impotence, cough, dizzy upon standing) Disp. Time Lamount Cohen Time) Disposition Final User 11/19/2016 9:23:32 PM See PCP When Office is Open (within 3 days) Yes Trudie Reed, RN, Tabatha Care Advice Given Per Guideline SEE PCP WITHIN 3 DAYS: CARE ADVICE given per High Blood Pressure (Adult) guideline. * You need to be seen within 2 or 3 days. Call your doctor during regular office hours and make an appointment. An urgent care center is often the best source of care if your doctor's office is closed or you can't get an appointment. NOTE: If office will be open tomorrow, tell caller to call then, not in 3 days. CALL BACK IF: * Weakness or numbness of the face, arm or leg on one side of the body occurs * Difficulty walking, difficulty talking, or severe headache occurs * Chest pain or difficulty breathing occurs * You become worse. * The goal of blood pressure treatment for most people with hypertension is to keep the blood pressure under 140/90. For people that are 60 years or older, your doctor may instead want to keep the blood pressure under 150/90. Comments User: Lethea Killings, RN Date/Time Lamount Cohen Time): 11/19/2016 9:24:52 PM PLEASE NOTE: All timestamps contained within this report are represented as [place] Standard Time. CONFIDENTIALTY NOTICE: This fax transmission is intended only for the addressee. It contains information that is legally privileged, confidential or otherwise protected from use or disclosure. If you are not the intended recipient, you are strictly prohibited from reviewing, disclosing, copying using or disseminating any of this information or taking any action in reliance on or regarding this information. If you have received this fax in error, please notify us immediately by telephone so that we can arrange for its return to [place]. Phone: [phone], Toll-Free: [phone], Fax: [phone] Page: 2 of 2  Call Id:  [id number] Comments triage nurse dose not have access to epic

## 2016-11-20 NOTE — Progress Notes (Signed)
Subjective:   Patient ID: Heidi Carroll, female    DOB: Apr 29, 1941, 76 y.o.   MRN: [id number]  Heidi SoxSallye K Carroll is a pleasant 76 y.o. year old female patient of Dr. Alphonsus SiasLetvak, new to me who presents to clinic today with Hypertension (187/85)  on 11/20/2016  HPI:  Saw PCP two days ago for hospital follow up.  Note reviewed.  New diagnosis of COPD.  Started on Wellbutrin for depression and smoking cessation.  BP was elevated in the hospital but not here in office two days ago when she was seen for follow up.  BP at that OV was 128/76.  BP Readings from Last 3 Encounters:  11/20/16 (!) 182/96  11/18/16 128/76  11/16/16 (!) 129/45   Late yesterday afternoon, she felt anxious so she checked her BP at home and it was 191/96.  Never had any chest pain or SOB.  Last dose of Wellbutrin was yesterday at 10:30 am. Current Outpatient Prescriptions on File Prior to Visit  Medication Sig Dispense Refill  . albuterol (PROVENTIL HFA;VENTOLIN HFA) 108 (90 Base) MCG/ACT inhaler Inhale 2 puffs into the lungs every 6 (six) hours as needed for wheezing or shortness of breath. 1 Inhaler 0  . aspirin 81 MG tablet Take 81 mg by mouth daily.      Marland Kitchen. buPROPion (WELLBUTRIN XL) 150 MG 24 hr tablet Take 1 tablet (150 mg total) by mouth daily. 30 tablet 3  . calcium carbonate (OS-CAL) 600 MG TABS Take 600 mg by mouth daily.      Marland Kitchen. gabapentin (NEURONTIN) 600 MG tablet TAKE ONE TABLET BY MOUTH AT BEDTIME 90 tablet 3   No current facility-administered medications on file prior to visit.     Allergies  Allergen Reactions  . Cymbalta [Duloxetine Hcl] Nausea Only  . Penicillins     Has patient had a PCN reaction causing immediate rash, facial/tongue/throat swelling, SOB or lightheadedness with hypotension yes Has patient had a PCN reaction causing severe rash involving mucus membranes or skin necrosis: yes Has patient had a PCN reaction that required hospitalization no Has patient had a PCN reaction occurring  within the last 10 years: no If all of the above answers are "NO", then may proceed with Cephalosporin use.    Past Medical History:  Diagnosis Date  . Depression   . Neuropathy (HCC)    idiopathic    Past Surgical History:  Procedure Laterality Date  . FOOT SURGERY  1995   bilateral   . SPINAL CORD STIMULATOR IMPLANT  12/1996   didn't keep  . VAGINAL DELIVERY  1963   X 1  . VAGINAL HYSTERECTOMY  1974    Family History  Problem Relation Age of Onset  . Cancer Father   . Diabetes Maternal Grandfather     Social History   Social History  . Marital status: Widowed    Spouse name: N/A  . Number of children: 1  . Years of education: N/A   Occupational History  . Retired, [employer]([place])    Social History Main Topics  . Smoking status: Current Every Day Smoker    Packs/day: 1.00    Types: Cigarettes  . Smokeless tobacco: Never Used     Comment: . Info given on 1-800-QUIT NOW  . Alcohol use No  . Drug use: No  . Sexual activity: Not on file   Other Topics Concern  . Not on file   Social History Narrative   Husband died 10/14  Has living will   Has health care POA--- Chaya Jan, friend   Requests DNR--- form done 12/22/14   No tube feedings if cognitively unaware   The PMH, PSH, Social History, Family History, Medications, and allergies have been reviewed in [facility], and have been updated if relevant.    Review of Systems  Respiratory: Negative.   Cardiovascular: Negative.   Neurological: Positive for dizziness. Negative for tremors, seizures, syncope, facial asymmetry, speech difficulty, light-headedness, numbness and headaches.  Psychiatric/Behavioral: The patient is nervous/anxious.        Objective:    BP (!) 182/96   Pulse (!) 56   Temp 97.6 F (36.4 C) (Oral)   Wt 147 lb (66.7 kg)   SpO2 96%   BMI 26.04 kg/m    Physical Exam  Constitutional: She is oriented to person, place, and time. She appears well-developed and  well-nourished. No distress.  HENT:  Head: Normocephalic and atraumatic.  Eyes: Conjunctivae are normal.  Cardiovascular: Normal rate and regular rhythm.   Pulmonary/Chest: Effort normal and breath sounds normal.  Neurological: She is alert and oriented to person, place, and time. No cranial nerve deficit.  Skin: Skin is warm and dry. She is not diaphoretic.  Psychiatric: She has a normal mood and affect. Her behavior is normal. Judgment and thought content normal.  Nursing note and vitals reviewed.         Assessment & Plan:   Mood disorder (HCC)  Elevated blood pressure, situational No Follow-up on file.

## 2016-11-21 ENCOUNTER — Encounter: Payer: Self-pay | Admitting: Internal Medicine

## 2016-11-21 ENCOUNTER — Ambulatory Visit (INDEPENDENT_AMBULATORY_CARE_PROVIDER_SITE_OTHER): Payer: Medicare Other | Admitting: Internal Medicine

## 2016-11-21 VITALS — BP 140/78 | HR 58 | Temp 98.0°F | Wt 150.0 lb

## 2016-11-21 DIAGNOSIS — F17219 Nicotine dependence, cigarettes, with unspecified nicotine-induced disorders: Secondary | ICD-10-CM

## 2016-11-21 NOTE — Patient Instructions (Signed)
Please go ahead with the patches --starting today. Buy some of the nicotine lozenges--- you should use one of them anytime you have a severe craving for a cigarette (and absolutely DO NOT SMOKE!!)

## 2016-11-21 NOTE — Assessment & Plan Note (Signed)
Reaction to bupropion I am reluctant to give her chantix Counseled---will try the patch and then nicotine lozenges prn

## 2016-11-21 NOTE — Progress Notes (Signed)
Pre visit review using our clinic review tool, if applicable. No additional management support is needed unless otherwise documented below in the visit note. 

## 2016-11-21 NOTE — Progress Notes (Signed)
   Subjective:    Patient ID: Heidi Carroll, female    DOB: [birth year], 76 y.o.   MRN: [id number]  HPI Here due to inability to tolerate the bupropion  "I was in a fog" and her blood pressure went up Did buy the patches but didn't start it  Breathing is okay---feels it is "good" No cough  Current Outpatient Prescriptions on File Prior to Visit  Medication Sig Dispense Refill  . albuterol (PROVENTIL HFA;VENTOLIN HFA) 108 (90 Base) MCG/ACT inhaler Inhale 2 puffs into the lungs every 6 (six) hours as needed for wheezing or shortness of breath. 1 Inhaler 0  . aspirin 81 MG tablet Take 81 mg by mouth daily.      . calcium carbonate (OS-CAL) 600 MG TABS Take 600 mg by mouth daily.      Marland Kitchen. gabapentin (NEURONTIN) 600 MG tablet TAKE ONE TABLET BY MOUTH AT BEDTIME 90 tablet 3   No current facility-administered medications on file prior to visit.     Allergies  Allergen Reactions  . Cymbalta [Duloxetine Hcl] Nausea Only  . Penicillins     Has patient had a PCN reaction causing immediate rash, facial/tongue/throat swelling, SOB or lightheadedness with hypotension yes Has patient had a PCN reaction causing severe rash involving mucus membranes or skin necrosis: yes Has patient had a PCN reaction that required hospitalization no Has patient had a PCN reaction occurring within the last 10 years: no If all of the above answers are "NO", then may proceed with Cephalosporin use.  . Wellbutrin [Bupropion] Other (See Comments)    Elevated blood pressure, anxiety    Past Medical History:  Diagnosis Date  . Depression   . Neuropathy (HCC)    idiopathic    Past Surgical History:  Procedure Laterality Date  . FOOT SURGERY  1995   bilateral   . SPINAL CORD STIMULATOR IMPLANT  12/1996   didn't keep  . VAGINAL DELIVERY  1963   X 1  . VAGINAL HYSTERECTOMY  1974    Family History  Problem Relation Age of Onset  . Cancer Father   . Diabetes Maternal Grandfather     Social History    Social History  . Marital status: Widowed    Spouse name: N/A  . Number of children: 1  . Years of education: N/A   Occupational History  . Retired, [employer]([place])    Social History Main Topics  . Smoking status: Current Every Day Smoker    Packs/day: 1.00    Types: Cigarettes  . Smokeless tobacco: Never Used     Comment: . Info given on 1-800-QUIT NOW  . Alcohol use No  . Drug use: No  . Sexual activity: Not on file   Other Topics Concern  . Not on file   Social History Narrative   Husband died 10/14      Has living will   Has health care POA--- Heidi Carroll, friend   Requests DNR--- form done 12/22/14   No tube feedings if cognitively unaware   Review of Systems  Slept well last night Appetite is still not great--but doing okay     Objective:   Physical Exam  Constitutional: She appears well-nourished. No distress.  Psychiatric: She has a normal mood and affect. Her behavior is normal.          Assessment & Plan:

## 2016-12-24 ENCOUNTER — Encounter: Payer: Self-pay | Admitting: Internal Medicine

## 2016-12-24 ENCOUNTER — Ambulatory Visit (INDEPENDENT_AMBULATORY_CARE_PROVIDER_SITE_OTHER): Payer: Medicare Other | Admitting: Internal Medicine

## 2016-12-24 VITALS — BP 144/82 | HR 66 | Temp 98.4°F | Wt 154.2 lb

## 2016-12-24 DIAGNOSIS — F39 Unspecified mood [affective] disorder: Secondary | ICD-10-CM

## 2016-12-24 DIAGNOSIS — J439 Emphysema, unspecified: Secondary | ICD-10-CM | POA: Diagnosis not present

## 2016-12-24 NOTE — Progress Notes (Signed)
Pre visit review using our clinic review tool, if applicable. No additional management support is needed unless otherwise documented below in the visit note. 

## 2016-12-24 NOTE — Progress Notes (Signed)
Subjective:    Patient ID: Heidi Carroll, female    DOB: 1941/05/18, 76 y.o.   MRN: [id number]  HPI Here for follow up of COPD and mood issues  Down to no more than 3-4 cigarettes a day Has cut down but not stopped (had been over 1PPD before) Is on the patch Needed something to do with her hands Discussed a total quit day  Mood is some better Taking care of neighbor next door who has been sick Discussed her caregiving experience Is getting out more--going out to eat with other neighbors, etc  Breathing is good No regular cough  Not sleeping well Up and down a lot Tried tylenol--no help  Current Outpatient Prescriptions on File Prior to Visit  Medication Sig Dispense Refill  . aspirin 81 MG tablet Take 81 mg by mouth daily.      . calcium carbonate (OS-CAL) 600 MG TABS Take 600 mg by mouth daily.      Marland Kitchen gabapentin (NEURONTIN) 600 MG tablet TAKE ONE TABLET BY MOUTH AT BEDTIME 90 tablet 3   No current facility-administered medications on file prior to visit.     Allergies  Allergen Reactions  . Cymbalta [Duloxetine Hcl] Nausea Only  . Penicillins     Has patient had a PCN reaction causing immediate rash, facial/tongue/throat swelling, SOB or lightheadedness with hypotension yes Has patient had a PCN reaction causing severe rash involving mucus membranes or skin necrosis: yes Has patient had a PCN reaction that required hospitalization no Has patient had a PCN reaction occurring within the last 10 years: no If all of the above answers are "NO", then may proceed with Cephalosporin use.  . Wellbutrin [Bupropion] Other (See Comments)    Elevated blood pressure, anxiety    Past Medical History:  Diagnosis Date  . Depression   . Neuropathy (HCC)    idiopathic    Past Surgical History:  Procedure Laterality Date  . FOOT SURGERY  1995   bilateral   . SPINAL CORD STIMULATOR IMPLANT  12/1996   didn't keep  . VAGINAL DELIVERY  1963   X 1  . VAGINAL HYSTERECTOMY   1974    Family History  Problem Relation Age of Onset  . Cancer Father   . Diabetes Maternal Grandfather     Social History   Social History  . Marital status: Widowed    Spouse name: N/A  . Number of children: 1  . Years of education: N/A   Occupational History  . Retired, [employer])    Social History Main Topics  . Smoking status: Current Every Day Smoker    Packs/day: 1.00    Types: Cigarettes  . Smokeless tobacco: Never Used     Comment: . Info given on 1-800-QUIT NOW  . Alcohol use No  . Drug use: No  . Sexual activity: Not on file   Other Topics Concern  . Not on file   Social History Narrative   Husband died August 21, 2023      Has living will   Has health care POA--- Chaya Jan, friend   Requests DNR--- form done 12/22/14   No tube feedings if cognitively unaware   Review of Systems Eating better Weight is up 4# Having some crazy dreams---discussed taking nicotine patch off at bedtime    Objective:   Physical Exam  Constitutional: She appears well-nourished. No distress.  Pulmonary/Chest: Effort normal. No respiratory distress. She has no wheezes. She has no rales.  Decreased breath  sounds but clear  Psychiatric: She has a normal mood and affect. Her behavior is normal.  Brighter and clearly in better mood          Assessment & Plan:

## 2016-12-24 NOTE — Patient Instructions (Signed)
Please try melatonin 3mg  at bedtime to see if that helps you sleep. You could even double that to 6mg  if the smaller dose is not effective.

## 2016-12-24 NOTE — Assessment & Plan Note (Signed)
No symptoms now Has cut way back on cigarettes and plans to stop completely

## 2016-12-24 NOTE — Assessment & Plan Note (Signed)
Better with more to do Getting out with neighbors New dog--so walking him and has company Discussed trying melatonin for sleep

## 2017-01-01 ENCOUNTER — Encounter: Payer: Medicare Other | Admitting: Internal Medicine

## 2017-01-08 ENCOUNTER — Encounter: Payer: Medicare Other | Admitting: Internal Medicine

## 2017-01-14 ENCOUNTER — Ambulatory Visit (INDEPENDENT_AMBULATORY_CARE_PROVIDER_SITE_OTHER): Payer: Medicare Other | Admitting: Internal Medicine

## 2017-01-14 ENCOUNTER — Encounter: Payer: Self-pay | Admitting: Cardiology

## 2017-01-14 ENCOUNTER — Encounter: Payer: Self-pay | Admitting: Internal Medicine

## 2017-01-14 ENCOUNTER — Telehealth: Payer: Self-pay | Admitting: Internal Medicine

## 2017-01-14 ENCOUNTER — Ambulatory Visit (INDEPENDENT_AMBULATORY_CARE_PROVIDER_SITE_OTHER): Payer: Medicare Other | Admitting: Cardiology

## 2017-01-14 VITALS — BP 136/102 | HR 141 | Ht 63.0 in | Wt 151.0 lb

## 2017-01-14 VITALS — BP 122/70 | HR 137 | Temp 97.7°F | Wt 152.0 lb

## 2017-01-14 DIAGNOSIS — I4892 Unspecified atrial flutter: Secondary | ICD-10-CM | POA: Diagnosis not present

## 2017-01-14 DIAGNOSIS — R000 Tachycardia, unspecified: Secondary | ICD-10-CM

## 2017-01-14 MED ORDER — APIXABAN 5 MG PO TABS: 5.0000 mg | ORAL_TABLET | Freq: Two times a day (BID) | ORAL | 3 refills | Status: DC

## 2017-01-14 MED ORDER — DILTIAZEM HCL 30 MG PO TABS: 30.0000 mg | ORAL_TABLET | ORAL | 1 refills | Status: DC

## 2017-01-14 NOTE — Progress Notes (Signed)
Pre visit review using our clinic review tool, if applicable. No additional management support is needed unless otherwise documented below in the visit note. 

## 2017-01-14 NOTE — Telephone Encounter (Signed)
Pt has appt with Dr Alphonsus SiasLetvak on 01/14/17 at 12:30.

## 2017-01-14 NOTE — Progress Notes (Signed)
Cardiology Office Note   Date:  01/14/2017   ID:  Heidi Carroll, DOB 1940-11-21, MRN [id number]  Referring Doctor:  Tillman Abideichard Letvak, MD   Cardiologist:   Almond LintAileen Abraham Entwistle, MD   Reason for consultation:  Chief Complaint  Patient presents with  . other    [facility]. TACHYCARDIA. Pt had echo and DG chest 2 view in Jan. Reviewed meds with pt verbally.      History of Present Illness: Heidi Carroll is a 76 y.o. female who presents for tachycardia. Patient presents to PCP's office today for palpitations. Symptoms started this morning, 8:30 AM, all of a sudden, felt her heart racing. At her PCPs, EKG was done. Heart rate was fast. Cardiology appointment was scheduled for this afternoon.  During the visit, an EKG was done and was personally reviewed by me it showed likely atrial flutter with 2-1 AV conduction, ventricular rate of 140 bpm, left axis deviation, nonspecific IVCD.  Review of records showed that patient had a similar episode in January. There was a cardiology consult no tremor 16 2018 by Dr. Darrold JunkerParaschos:   "76 year old female referred for evaluation of atrial flutter. Patient denies significant past medical or cardiac history. She was in her usual state of health until last evening, when she noted per dictations and racing heart. The patient went to bed, awoke today with persistent operative dictations and racing heart. EMS was called, [facility] where she was noted to be tachycardic heart rate of 200 bpm, with ECG system with atrial flutter and one-to-one conduction. The patient was given 10 mg of intravenous Lopressor, and approximately an hour later remained in atrial flutter with 2 to one conduction at a rate of 120 bpm. Eventually, tachycardia resolved, patient was in predominant junctional rhythm with intermittent sinus beats, with heart rates as low as 30 bpm, currently 50 bpm. Patient denies chest pain or shortness of breath. The pressure  currently is 90/60."  It appears from that hospital stay, that she has sinus node dysfunction with atrial flutter and significant bradycardia/junctional rhythm. She had no follow-up with cardiology post discharge. She is on aspirin 81 mg by mouth daily.  Patient denies chest pain. She has some shortness of breath. Previous discharge summaries indicate COPD exacerbation. She is not on any inhalers.   ROS:  Please see the history of present illness. Aside from mentioned under HPI, all other systems are reviewed and negative.     Past Medical History:  Diagnosis Date  . Depression   . Neuropathy (HCC)    idiopathic    Past Surgical History:  Procedure Laterality Date  . FOOT SURGERY  1995   bilateral   . SPINAL CORD STIMULATOR IMPLANT  12/1996   didn't keep  . VAGINAL DELIVERY  1963   X 1  . VAGINAL HYSTERECTOMY  1974     reports that she has been smoking Cigarettes.  She has been smoking about 0.25 packs per day. She has never used smokeless tobacco. She reports that she does not drink alcohol or use drugs.   family history includes Cancer in her father; Diabetes in her maternal grandfather; Heart attack in her brother.   Outpatient Medications Prior to Visit  Medication Sig Dispense Refill  . aspirin 81 MG tablet Take 81 mg by mouth daily.      . calcium carbonate (OS-CAL) 600 MG TABS Take 600 mg by mouth daily.      Marland Kitchen. gabapentin (NEURONTIN)  600 MG tablet TAKE ONE TABLET BY MOUTH AT BEDTIME 90 tablet 3   No facility-administered medications prior to visit.      Allergies: Cymbalta [duloxetine hcl]; Penicillins; and Wellbutrin [bupropion]    PHYSICAL EXAM: VS:  BP (!) 136/102 (BP Location: Right Arm, Patient Position: Sitting, Cuff Size: Normal)   Pulse (!) 141   Ht 5\' 3"  (1.6 m)   Wt 151 lb (68.5 kg)   BMI 26.75 kg/m  , Body mass index is 26.75 kg/m. Wt Readings from Last 3 Encounters:  01/14/17 151 lb (68.5 kg)  01/14/17 152 lb (68.9 kg)  12/24/16 154 lb 4 oz  (70 kg)    GENERAL:  well developed, well nourished, not in acute distress HEENT: normocephalic, pink conjunctivae, anicteric sclerae, no xanthelasma, normal dentition, oropharynx clear NECK:  no neck vein engorgement, JVP normal, no hepatojugular reflux, carotid upstroke brisk and symmetric, no bruit, no thyromegaly, no lymphadenopathy LUNGS:  good respiratory effort, clear to auscultation bilaterally, pectus carinatum CV:  PMI not displaced, no thrills, no lifts, Tachycardic ,S1 and S2 within normal limits, no palpable S3 or S4, no murmurs, no rubs, no gallops ABD:  Soft, nontender, nondistended, normoactive bowel sounds, no abdominal aortic bruit, no hepatomegaly, no splenomegaly MS: nontender back, no kyphosis, no scoliosis, no joint deformities EXT:  2+ DP/PT pulses, no edema, no varicosities, no cyanosis, no clubbing SKIN: warm, nondiaphoretic, normal turgor, no ulcers NEUROPSYCH: alert, oriented to person, place, and time, sensory/motor grossly intact, normal mood, appropriate affect  Recent Labs: 11/11/2016: ALT 12; TSH 1.677 11/15/2016: Magnesium 2.1 11/16/2016: BUN 18; Creatinine, Ser 0.75; Hemoglobin 15.1; Platelets 171; Potassium 3.6; Sodium 141   Lipid Panel No results found for: CHOL, TRIG, HDL, CHOLHDL, VLDL, LDLCALC, LDLDIRECT   Other studies Reviewed:  EKG:  The ekg from 01/14/2017 was personally reviewed by me and it revealed likely atrial flutter 2-1 AV conduction, jugular rate 140 bpm, left axis deviation, nonspecific IVCD.  Additional studies/ records that were reviewed personally reviewed by me today include:  Echo 11/02/2016: Left ventricle: The cavity size was normal. Wall thickness was   normal. Systolic function was normal. The estimated ejection   fraction was in the range of 55% to 65%. - Aortic valve: There was mild regurgitation. Valve area (Vmax):   2.09 cm^2. - Mitral valve: There was mild regurgitation. - Right atrium: The atrium was mildly  dilated.   ASSESSMENT AND PLAN: Atrial flutter, 2-1 AV conduction, rapid ventricular rates History of possible sinus node dysfunction with significant bradycardia/junctional rhythm. LV EF preserved. Left atrium mildly dilated. History of minimally elevated troponin levels - likely demand ischemia  Ideally, recommend admission to hospital for further evaluation and management. Will require close monitoring on telemetry with IV beta blocker IV calcium channel blocker as she did have episodes of bradycardia with heart rate in the 30s, junctional rhythm in the hospital Ideally also get EP to evaluate her  CHADS2-VASc=2 (age, female gender). Recommend anticoagulation with DOAC.  Addendum: Patient noted to feel better. Pulse checked. EKG redone and showed sinus rhythm 60 bpm. We will manage her as an outpatient. Cardizem 30 mg by mouth as needed for heart rates in the 140s. Patient had a pulse ox at home and she remembers exactly how she fell this morning. She will call our office if that happens again. Start Eliquis 5 mg by mouth twice a day. D/c ASA.  EP consult. rec work up for CPD through PCP.   Current medicines are reviewed at  length with the patient today.  The patient does not have concerns regarding medicines.  Labs/ tests ordered today include:  Orders Placed This Encounter  Procedures  . Ambulatory referral to Cardiac Electrophysiology  . EKG 12-Lead    I had a lengthy and detailed discussion with the patient regarding diagnoses, prognosis, diagnostic options, treatment options , and side effects of medications.    Disposition:   FU with Cardiology post EP  Thank you for this consultation. We will forwarding this consultation to referring physician.   I spent at least 60 minutes with the patient today and more than 50% of the time was spent counseling the patient and coordinating care.    Signed, Almond Lint, MD  01/14/2017 4:37 PM    [facility]  [facility]  This note was generated in part with voice recognition software and I apologize for any typographical errors that were not detected and corrected.

## 2017-01-14 NOTE — Progress Notes (Signed)
Subjective:    Patient ID: Heidi Carroll, female    DOB: 1941/02/22, 76 y.o.   MRN: [id number]  HPI Here due to rapid heart rate Noticed it this morning while sitting at computer No chest pain but feels weak Checked pulse with oximeter--- 161 was the highest reading (the first one)  Feels some better now No edema  Has had some panic feelings in past, especially while in hospital Did notice fast heart then also (but the panic came before the fast heartrate) Panic feeling not there now  Current Outpatient Prescriptions on File Prior to Visit  Medication Sig Dispense Refill  . aspirin 81 MG tablet Take 81 mg by mouth daily.      . calcium carbonate (OS-CAL) 600 MG TABS Take 600 mg by mouth daily.      Marland Kitchen gabapentin (NEURONTIN) 600 MG tablet TAKE ONE TABLET BY MOUTH AT BEDTIME 90 tablet 3   No current facility-administered medications on file prior to visit.     Allergies  Allergen Reactions  . Cymbalta [Duloxetine Hcl] Nausea Only  . Penicillins     Has patient had a PCN reaction causing immediate rash, facial/tongue/throat swelling, SOB or lightheadedness with hypotension yes Has patient had a PCN reaction causing severe rash involving mucus membranes or skin necrosis: yes Has patient had a PCN reaction that required hospitalization no Has patient had a PCN reaction occurring within the last 10 years: no If all of the above answers are "NO", then may proceed with Cephalosporin use.  . Wellbutrin [Bupropion] Other (See Comments)    Elevated blood pressure, anxiety    Past Medical History:  Diagnosis Date  . Depression   . Neuropathy (HCC)    idiopathic    Past Surgical History:  Procedure Laterality Date  . FOOT SURGERY  1995   bilateral   . SPINAL CORD STIMULATOR IMPLANT  12/1996   didn't keep  . VAGINAL DELIVERY  1963   X 1  . VAGINAL HYSTERECTOMY  1974    Family History  Problem Relation Age of Onset  . Cancer Father   . Diabetes Maternal Grandfather      Social History   Social History  . Marital status: Widowed    Spouse name: N/A  . Number of children: 1  . Years of education: N/A   Occupational History  . Retired, [employer])    Social History Main Topics  . Smoking status: Current Some Day Smoker    Packs/day: 1.00    Types: Cigarettes  . Smokeless tobacco: Never Used     Comment: 1-3 every so often  . Alcohol use No  . Drug use: No  . Sexual activity: Not on file   Other Topics Concern  . Not on file   Social History Narrative   Husband died Sep 09, 2023      Has living will   Has health care POA--- Chaya Jan, friend   Requests DNR--- form done 12/22/14   No tube feedings if cognitively unaware   Review of Systems  Appetite is still good Smoking--but no more than 2-3 per day. Still using the patch     Objective:   Physical Exam  Constitutional: She appears well-nourished. No distress.  Neck: No thyromegaly present.  Cardiovascular: Regular rhythm and normal heart sounds.  Exam reveals no gallop.   No murmur heard. Fast (~120) with rare skips  Pulmonary/Chest: Effort normal. No respiratory distress. She has no wheezes. She has no rales.  Decreased breath sounds but clear  Abdominal: Soft. There is no tenderness.  Musculoskeletal: She exhibits no edema.  Lymphadenopathy:    She has no cervical adenopathy.          Assessment & Plan:

## 2017-01-14 NOTE — Telephone Encounter (Signed)
PLEASE NOTE: All timestamps contained within this report are represented as [place]. CONFIDENTIALTY NOTICE: This fax transmission is intended only for the addressee. It contains information that is legally privileged, confidential or otherwise protected from use or disclosure. If you are not the intended recipient, you are strictly prohibited from reviewing, disclosing, copying using or disseminating any of this information or taking any action in reliance on or regarding this information. If you have received this fax in error, please notify us immediately by telephone so that we can arrange for its return to us. Phone: [phone], Toll-Free: [phone], Fax: [phone] Page: 1 of 1 Call Id: [id number] [facility] - Client TELEPHONE ADVICE RECORD [facility] Patient Name: Heidi Carroll DOB: 01-07-1941 Initial Comment Caller's heart rate is up, she does not know why, it is at 137 Nurse Assessment Nurse: Elijah Birkaldwell, RN, Stark BrayLynda Date/Time (Eastern Time): 01/14/2017 9:17:33 AM Confirm and document reason for call. If symptomatic, describe symptoms. ---Caller's heart rate is up, she does not know why, it is between 137-161. Measuring on an O2 Sat. machine. Her O2 is 96, normal for her, quitting smoking. Had this problem back in Jan., seen at ER, couldn't find cause. Can feel it racing, no other symptoms. Symptoms started this am. Does the patient have any new or worsening symptoms? ---Yes Will a triage be completed? ---Yes Related visit to physician within the last 2 weeks? ---No Does the PT have any chronic conditions? (i.e. diabetes, asthma, etc.) ---Yes List chronic conditions. ---neuropathy in feet, on Gabapentin Is this a behavioral health or substance abuse call? ---No Guidelines Guideline Title Affirmed Question Affirmed Notes Heart Rate and Heartbeat Questions [1] Heart beating very rapidly (e.g., > 140 / minute) AND [2]  not present now (Exception: during exercise) Final Disposition User See Physician within 4 Hours (or PCP triage) Elijah Birkaldwell, RN, Lynda Referrals REFERRED TO PCP OFFICE Disagree/Comply: Comply

## 2017-01-14 NOTE — Telephone Encounter (Signed)
Pulse on oximeter may not be reliable---but she certainly needs to be checked

## 2017-01-14 NOTE — Patient Instructions (Signed)
Medication Instructions:  Your physician has recommended you make the following change in your medication:  1. START Cardizem (Diltiazem) 30 mg as needed for heart rate greater than 130. 2. START Eliquis 5 mg twice a day  Medication Samples have been provided to the patient.  Drug name: Eliquis       Strength: 5 mg        Qty: 4 boxes  LOT: [id number]  Exp.Date: Mar 2020  Dosing instructions: Take 1 pill twice a day  Follow-Up: You have been referred to Dr. Graciela HusbandsKlein for evaluation.   Your physician recommends that you schedule a follow-up appointment with Dr. Alvino ChapelIngal after you see Dr. Graciela HusbandsKlein.   It was a pleasure seeing you today here in the office. Please do not hesitate to give us a call back if you have any further questions. [phone]  Edgeley CellarPamela A. RN, BSN

## 2017-01-14 NOTE — Assessment & Plan Note (Signed)
Regular--narrow complex ?accelerated junctional Does have rare sinus beat---with same morphology Had hospitalization with rapid a flutter recently---converted, then was bradycardic No sig symptoms from this now  Will need emergent cardiology eval Would start low dose beta blocker if can't be seen today

## 2017-01-15 ENCOUNTER — Telehealth: Payer: Self-pay | Admitting: Cardiology

## 2017-01-15 NOTE — Telephone Encounter (Signed)
Pt calling stating yesterday when she was seen her issues was that her HR was too high  Now today she is noticing it being very low. Around the 50's  Would like to know what to do in this case  Please call back

## 2017-01-15 NOTE — Telephone Encounter (Signed)
Spoke with patient and she had heart rate of 58 and she wanted to know if she should be concerned. She denied any other symptoms at this time and she has not taken any of the cardizem. Reviewed average heart rate with her and let her know that she should definitely call if she has any additional symptoms with the lower heart rates. She denies any at this time but states that this is all new to her and she is worried. Provided reassurance to her and let her know to call us if she needs anything. She was appreciative for the call and verbalized understanding of our conversation, agreement with plan, and had no further questions at this time.

## 2017-01-20 ENCOUNTER — Telehealth: Payer: Self-pay | Admitting: Cardiology

## 2017-01-20 NOTE — Telephone Encounter (Signed)
Patient called about asymptomatic hypertension. Had a "hot flash" earlier today. Checked blood pressure and found systolic pressure to be in the 170's to 180's. No chest pain, dyspnea, neurologic symptoms. Occasionally will feel "a bit flushed." Not taking any blood pressure medications (other than diltiazem for heart rate). Currently in no pain. Heart rate in the 60's.   Patient's history reviewed. Has had issues with anxiety and panic in the past. Patient believes that this might be the cause of her current elevated BP. Instructed patient to do some deep breathing exercises, mediation, and rest. Recheck BP in AM and can call back if still elevated to >180.   Patient understands she should call or report to ED if she develops chest pain, dyspnea, or any new neurologic symptom.

## 2017-01-21 ENCOUNTER — Telehealth: Payer: Self-pay

## 2017-01-21 MED ORDER — ALPRAZOLAM 0.25 MG PO TABS: ORAL_TABLET | ORAL | 0 refills | Status: DC

## 2017-01-21 NOTE — Telephone Encounter (Signed)
Discussed with her BP was up--- did note some anxiety so the suggestion was to consider anxiety meds. She is anxious now over current cardiology testing, etc Does have panic type symptoms at times  Will give order for alprazolam 0.25mg  0.5-1 tab bid prn for panic symptoms Shannon,  Please call in #[phone] x [address] [facility] [address]

## 2017-01-21 NOTE — Telephone Encounter (Signed)
Pt left v/m; pt has sporadic problems with BP and pt spoke with cardiologist on call 01/20/17 and was suggested to get med for anxiety. Pt request med to [facility] [address]. Pt request cb. Please seen phone note 01/20/17 with Dr Liane Comberarnicelli, cardiologist. Pt last seen 01/14/17.

## 2017-01-21 NOTE — Telephone Encounter (Signed)
rx called in. Spoke to pt to let her know it was called in

## 2017-01-25 ENCOUNTER — Emergency Department
Admission: EM | Admit: 2017-01-25 | Discharge: 2017-01-25 | Disposition: A | Discharge status: Home / Self Care | Payer: Medicare Other | Attending: Emergency Medicine | Admitting: Emergency Medicine

## 2017-01-25 ENCOUNTER — Emergency Department: Payer: Medicare Other

## 2017-01-25 ENCOUNTER — Telehealth: Payer: Self-pay | Admitting: Physician Assistant

## 2017-01-25 DIAGNOSIS — J449 Chronic obstructive pulmonary disease, unspecified: Secondary | ICD-10-CM | POA: Diagnosis not present

## 2017-01-25 DIAGNOSIS — F17210 Nicotine dependence, cigarettes, uncomplicated: Secondary | ICD-10-CM | POA: Diagnosis not present

## 2017-01-25 DIAGNOSIS — Z7982 Long term (current) use of aspirin: Secondary | ICD-10-CM | POA: Diagnosis not present

## 2017-01-25 DIAGNOSIS — Z79899 Other long term (current) drug therapy: Secondary | ICD-10-CM | POA: Insufficient documentation

## 2017-01-25 DIAGNOSIS — R002 Palpitations: Secondary | ICD-10-CM | POA: Diagnosis present

## 2017-01-25 DIAGNOSIS — I480 Paroxysmal atrial fibrillation: Secondary | ICD-10-CM | POA: Diagnosis not present

## 2017-01-25 DIAGNOSIS — R918 Other nonspecific abnormal finding of lung field: Secondary | ICD-10-CM | POA: Diagnosis not present

## 2017-01-25 LAB — CBC
HCT: 46.2 % (ref 35.0–47.0)
Hemoglobin: 15.6 g/dL (ref 12.0–16.0)
MCH: 32.7 pg (ref 26.0–34.0)
MCHC: 33.8 g/dL (ref 32.0–36.0)
MCV: 96.7 fL (ref 80.0–100.0)
PLATELETS: 188 10*3/uL (ref 150–440)
RBC: 4.78 MIL/uL (ref 3.80–5.20)
RDW: 13.1 % (ref 11.5–14.5)
WBC: 7.2 10*3/uL (ref 3.6–11.0)

## 2017-01-25 LAB — COMPREHENSIVE METABOLIC PANEL
ALBUMIN: 4 g/dL (ref 3.5–5.0)
ALT: 12 U/L — ABNORMAL LOW (ref 14–54)
AST: 18 U/L (ref 15–41)
Alkaline Phosphatase: 67 U/L (ref 38–126)
Anion gap: 7 (ref 5–15)
BUN: 11 mg/dL (ref 6–20)
CHLORIDE: 104 mmol/L (ref 101–111)
CO2: 29 mmol/L (ref 22–32)
Calcium: 9.2 mg/dL (ref 8.9–10.3)
Creatinine, Ser: 0.75 mg/dL (ref 0.44–1.00)
GFR calc Af Amer: >60 mL/min (ref >60)
GLUCOSE: 89 mg/dL (ref 65–99)
POTASSIUM: 3.7 mmol/L (ref 3.5–5.1)
Sodium: 140 mmol/L (ref 135–145)
Total Bilirubin: 0.5 mg/dL (ref 0.3–1.2)
Total Protein: 6.8 g/dL (ref 6.5–8.1)

## 2017-01-25 LAB — TROPONIN I

## 2017-01-25 NOTE — ED Triage Notes (Signed)
Pt states that around 1510 she started feeling like her heart was racing, pt states that she felt like she was going to explode in her chest, pt states that this happens every couple weeks, pt states that she has been to the cardiologist dr Ernestine Mcmurray who prescribed cardiazem as needed when her heart starts beating fast, pt states that she took one and states that it is working, no distress noted at this time, pt states that they have diagnosed her with A. Flutter

## 2017-01-25 NOTE — Telephone Encounter (Signed)
Received page from patient about elevated HR. When I returned page her neighbor answered and informed me she's just left to go to the hospital. Neighbor states "everything seems to be back to normal but they wanted her to go get checked out." I thanked her for letting us know. Annastacia Duba PA-C

## 2017-01-25 NOTE — Telephone Encounter (Signed)
Thank you :)

## 2017-01-25 NOTE — ED Provider Notes (Signed)
[facility] Emergency Department Provider Note  Time seen: 4:15 PM  I have reviewed the triage vital signs and the nursing notes.   HISTORY  Chief Complaint No chief complaint on file.    HPI Heidi Carroll is a 76 y.o. female with a past medical history of depression, paroxysmal atrial fibrillation on eliquis who presents to the emergency department with palpitations and rapid heart rate. According to the patient approximately 1 hour ago she developed a rapid heart rate. States she can feel her heart racing with heart/chest palpitations. He took her pulse on her home pulse oximeter and it read as 209. Patient took a 30 mg diltiazem tablet and called EMS. EMS states upon their arrival the patient had converted back to a normal sinus rhythm with a heart rate between 60 and 70 bpm. Patient's only complaint was of chest tightness. Here the patient remains in normal sinus rhythm continues to state chest tightness. Patient states she feels much better than she did an hour ago. Denies any pain. Denies any leg pain or swelling. Patient has been seen by her cardiologist as this paroxysmal atrial fibrillation only started in January. She has an appointment next week with an EP physician.  Past Medical History:  Diagnosis Date  . Depression   . Neuropathy [facility])    idiopathic    Patient Active Problem List   Diagnosis Date Noted  . Tachycardia 01/14/2017  . COPD (chronic obstructive pulmonary disease) (HCC) 11/18/2016  . Mood disorder (HCC) 11/18/2016  . Elevated blood pressure, situational 11/18/2016  . COPD exacerbation (HCC) 11/15/2016  . Bradycardia 11/11/2016  . Nicotine dependence 12/31/2015  . Preventative health care 12/22/2014  . Advance directive discussed with patient 12/22/2014  . Idiopathic peripheral neuropathy 04/02/2007    Past Surgical History:  Procedure Laterality Date  . FOOT SURGERY  1995   bilateral   . SPINAL CORD STIMULATOR IMPLANT   12/1996   didn't keep  . VAGINAL DELIVERY  1963   X 1  . VAGINAL HYSTERECTOMY  1974    Prior to Admission medications   Medication Sig Start Date End Date Taking? Authorizing Provider  ALPRAZolam Prudy Feeler) 0.25 MG tablet Take 1/2 to 1 tablet by mouth twice a day as needed for panic symptoms. 01/21/17   Karie Schwalbe, MD  apixaban (ELIQUIS) 5 MG TABS tablet Take 1 tablet (5 mg total) by mouth 2 (two) times daily. 01/14/17   Almond Lint, MD  aspirin 81 MG tablet Take 81 mg by mouth daily.      Historical Provider, MD  calcium carbonate (OS-CAL) 600 MG TABS Take 600 mg by mouth daily.      Historical Provider, MD  diltiazem (CARDIZEM) 30 MG tablet Take 1 tablet (30 mg total) by mouth as directed. Take for heart rate greater than 130 01/14/17   Almond Lint, MD  gabapentin (NEURONTIN) 600 MG tablet TAKE ONE TABLET BY MOUTH AT BEDTIME 02/05/16   Karie Schwalbe, MD    Allergies  Allergen Reactions  . Cymbalta [Duloxetine Hcl] Nausea Only  . Penicillins     Has patient had a PCN reaction causing immediate rash, facial/tongue/throat swelling, SOB or lightheadedness with hypotension yes Has patient had a PCN reaction causing severe rash involving mucus membranes or skin necrosis: yes Has patient had a PCN reaction that required hospitalization no Has patient had a PCN reaction occurring within the last 10 years: no If all of the above answers are "NO", then may proceed  with Cephalosporin use.  . Wellbutrin [Bupropion] Other (See Comments)    Elevated blood pressure, anxiety    Family History  Problem Relation Age of Onset  . Cancer Father     colon  . Heart attack Brother   . Diabetes Maternal Grandfather     Social History Social History  Substance Use Topics  . Smoking status: Current Some Day Smoker    Packs/day: 0.25    Types: Cigarettes  . Smokeless tobacco: Never Used     Comment: 2-3 a day  . Alcohol use No    Review of Systems Constitutional: Negative for  fever. Cardiovascular: Negative for chest pain.Positive for chest tightness. Positive for palpitations now resolved. Respiratory: Negative for shortness of breath. Gastrointestinal: Negative for abdominal pain Neurological: Negative for headache 10-point ROS otherwise negative.  ____________________________________________   PHYSICAL EXAM:  Constitutional: Alert and oriented. Well appearing and in no distress. Eyes: Normal exam ENT   Head: Normocephalic and atraumatic.   Mouth/Throat: Mucous membranes are moist. Cardiovascular: Normal rate, regular rhythm. No murmur Respiratory: Normal respiratory effort without tachypnea nor retractions. Breath sounds are clear  Gastrointestinal: Soft and nontender. No distention.  Musculoskeletal: Nontender with normal range of motion in all extremities. No lower extremity edema or tenderness. 2+ DP pulse bilateral. Neurologic:  Normal speech and language. No gross focal neurologic deficits  Skin:  Skin is warm, dry and intact.  Psychiatric: Mood and affect are normal.   ____________________________________________    EKG  EKG reviewed and interpreted by myself shows normal sinus rhythm at 59 bpm, narrow QRS, normal axis, normal intervals and no concerning st changes.  ____________________________________________    RADIOLOGY  Chest x-ray negative  ____________________________________________   INITIAL IMPRESSION / ASSESSMENT AND PLAN / ED COURSE  Pertinent labs & imaging results that were available during my care of the patient were reviewed by me and considered in my medical decision making (see chart for details).  The patient presents to the emergency department with acute onset of rapid heart rate of 209 per patient via home device. Patient take diltiazem at home and appeared to have converted to normal sinus rhythm upon EMS arrival. Patient remains in normal sinus rhythm currently. Her only complaint is mild chest  tightness. We will check labs, chest x-ray and continue to closely monitor. Patient is on a blood thinner, has cardiology follow-up including an appointment with an EP physician next week.  Patient's labs are largely within normal limits. Troponin negative. EKG is normal sinus rhythm and reassuring. Chest x-ray is negative. Patient feels much better, denies any complaints at this time chest tightness has resolved.   ____________________________________________   FINAL CLINICAL IMPRESSION(S) / ED DIAGNOSES  Paroxysmal atrial fibrillation Palpitations    Minna Antis, MD 01/25/17 [phone]

## 2017-01-25 NOTE — ED Notes (Signed)
Pt taken to xray 

## 2017-01-27 ENCOUNTER — Telehealth: Payer: Self-pay

## 2017-01-27 NOTE — Telephone Encounter (Signed)
Spoke to pt to f/u on recent ER visit 01-25-17 for tachycardia. She said she is a little better today. She did not make an ov with [place] since she has an appt with cardiology

## 2017-01-29 ENCOUNTER — Encounter: Payer: Self-pay | Admitting: Internal Medicine

## 2017-01-29 ENCOUNTER — Ambulatory Visit (INDEPENDENT_AMBULATORY_CARE_PROVIDER_SITE_OTHER): Payer: Medicare Other | Admitting: Internal Medicine

## 2017-01-29 ENCOUNTER — Encounter: Payer: Self-pay | Admitting: *Deleted

## 2017-01-29 VITALS — BP 174/74 | HR 53 | Ht 63.0 in | Wt 154.2 lb

## 2017-01-29 DIAGNOSIS — Z01812 Encounter for preprocedural laboratory examination: Secondary | ICD-10-CM | POA: Diagnosis not present

## 2017-01-29 DIAGNOSIS — I4892 Unspecified atrial flutter: Secondary | ICD-10-CM | POA: Diagnosis not present

## 2017-01-29 NOTE — Addendum Note (Signed)
Addended by: Margrett Rud on: 01/29/2017 04:58 PM   Modules accepted: Orders

## 2017-01-29 NOTE — Progress Notes (Signed)
ELECTROPHYSIOLOGY CONSULT NOTE  Patient ID: Heidi Carroll, MRN: [id number], DOB/AGE: 1941/04/22 76 y.o. Admit date: (Not on file) Date of Consult: 01/29/2017  Primary Physician: Tillman Abide, MD Primary Cardiologist: A Jamal Collin is being seen today for the evaluation of Atrial flutter at the request of Dr Alvino Chapel   HPI WINOLA DRUM is a 76 y.o. female  Referred because of recurrent atrial flutter. Initially presenting 11/11/16 with tachypalpitations there is spontaneous reversion to sinus rhythm wherein she was noted to be bradycardic. Evaluation at that time included a negative CTA and echocardiogram demonstrating normal LV function and mild biatrial enlargement.  Thromboembolic risk profile is notable for age-34 hypertension-1 for a CHADS-VASc score of 3  She is currently managed with apixoban recently started with the discontinuation at that time of aspirin  She is a long-time smoker and has been told she has Hyperinflation consistent with emphysema  She's trying to stop smoking.  She has lost her husband about 5 years ago following strokes and her son 13 years ago to suicide     Past Medical History:  Diagnosis Date  . Depression   . Neuropathy [facility])    idiopathic      Surgical History:  Past Surgical History:  Procedure Laterality Date  . FOOT SURGERY  1995   bilateral   . SPINAL CORD STIMULATOR IMPLANT  12/1996   didn't keep  . VAGINAL DELIVERY  1963   X 1  . VAGINAL HYSTERECTOMY  1974     Home Meds: Prior to Admission medications   Medication Sig Start Date End Date Taking? Authorizing Provider  ALPRAZolam Prudy Feeler) 0.25 MG tablet Take 1/2 to 1 tablet by mouth twice a day as needed for panic symptoms. 01/21/17  Yes Karie Schwalbe, MD  apixaban (ELIQUIS) 5 MG TABS tablet Take 1 tablet (5 mg total) by mouth 2 (two) times daily. 01/14/17  Yes Almond Lint, MD  aspirin 81 MG tablet Take 81 mg by mouth daily.     Yes Historical Provider, MD   calcium carbonate (OS-CAL) 600 MG TABS Take 600 mg by mouth daily.     Yes Historical Provider, MD  diltiazem (CARDIZEM) 30 MG tablet Take 1 tablet (30 mg total) by mouth as directed. Take for heart rate greater than 130 01/14/17  Yes Almond Lint, MD  gabapentin (NEURONTIN) 600 MG tablet TAKE ONE TABLET BY MOUTH AT BEDTIME 02/05/16  Yes Karie Schwalbe, MD    Allergies:  Allergies  Allergen Reactions  . Cymbalta [Duloxetine Hcl] Nausea Only  . Penicillins     Has patient had a PCN reaction causing immediate rash, facial/tongue/throat swelling, SOB or lightheadedness with hypotension yes Has patient had a PCN reaction causing severe rash involving mucus membranes or skin necrosis: yes Has patient had a PCN reaction that required hospitalization no Has patient had a PCN reaction occurring within the last 10 years: no If all of the above answers are "NO", then may proceed with Cephalosporin use.  . Wellbutrin [Bupropion] Other (See Comments)    Elevated blood pressure, anxiety    Social History   Social History  . Marital status: Widowed    Spouse name: N/A  . Number of children: 1  . Years of education: N/A   Occupational History  . Retired, [employer])    Social History Main Topics  . Smoking status: Current Some Day Smoker    Packs/day: 0.25    Types:  Cigarettes  . Smokeless tobacco: Never Used     Comment: 2-3 a day  . Alcohol use No  . Drug use: No  . Sexual activity: Not on file   Other Topics Concern  . Not on file   Social History Narrative   Husband died 18-Aug-2023      Has living will   Has health care POA--- Chaya Jan, friend   Requests DNR--- form done 12/22/14   No tube feedings if cognitively unaware     Family History  Problem Relation Age of Onset  . Cancer Father     colon  . Heart attack Brother   . Diabetes Maternal Grandfather      ROS:  Please see the history of present illness.     All other systems reviewed and negative.     Physical Exam: Blood pressure (!) 174/74, pulse (!) 53, height  (1.6 m), weight 154 lb 4 oz (70 kg). General: Well developed, well nourished female in no acute distress. Head: Normocephalic, atraumatic, sclera non-icteric, no xanthomas, nares are without discharge. EENT: normal  Lymph Nodes:  none Neck: Negative for carotid bruits. JVD not elevated. Back:without scoliosis kyphosis Lungs: Clear bilaterally to auscultation without wheezes, rales, or rhonchi. Breathing is unlabored. Breath sounds are decreased. Increased AP diameter Heart: RRR with S1 S2. No  murmur . No rubs, or gallops appreciated. Abdomen: Soft, non-tender, non-distended with normoactive bowel sounds. No hepatomegaly. No rebound/guarding. No obvious abdominal masses. Msk:  Strength and tone appear normal for age. Extremities: No clubbing or cyanosis. No edema.  Distal pedal pulses are 2+ and equal bilaterally. Skin: Warm and Dry Neuro: Alert and oriented X 3. CN III-XII intact Grossly normal sensory and motor function . Psych:  Responds to questions appropriately with a normal affect.      Labs: Cardiac Enzymes No results for input(s): CKTOTAL, CKMB, TROPONINI in the last 72 hours. CBC Lab Results  Component Value Date   WBC 7.2 01/25/2017   HGB 15.6 01/25/2017   HCT 46.2 01/25/2017   MCV 96.7 01/25/2017   PLT 188 01/25/2017   PROTIME: No results for input(s): LABPROT, INR in the last 72 hours. Chemistry  Recent Labs Lab 01/25/17 1614  NA 140  K 3.7  CL 104  CO2 29  BUN 11  CREATININE 0.75  CALCIUM 9.2  PROT 6.8  BILITOT 0.5  ALKPHOS 67  ALT 12*  AST 18  GLUCOSE 89   Lipids No results found for: CHOL, HDL, LDLCALC, TRIG BNP No results found for: PROBNP Thyroid Function Tests: No results for input(s): TSH, T4TOTAL, T3FREE, THYROIDAB in the last 72 hours.  Invalid input(s): FREET3 Miscellaneous No results found for: DDIMER  Radiology/Studies:  Dg Chest 2 View  Result Date:  01/25/2017 CLINICAL DATA:  Initial evaluation for acute chest tightness. EXAM: CHEST  2 VIEW COMPARISON:  Prior radiograph from 11/15/2016. FINDINGS: Cardiac and mediastinal silhouettes are stable in size and contour, and remain within normal limits. Aortic atherosclerosis noted. Lungs are hyperinflated with increased AP diameter seen on lateral projection. No focal infiltrates. No pulmonary edema or pleural effusion. No pneumothorax. No acute osseous abnormality.  Osteopenia noted. IMPRESSION: 1. Hyperinflation.  No other active cardiopulmonary disease. 2. Aortic atherosclerosis. Electronically Signed   By: Rise Mu M.D.   On: 01/25/2017 16:34    EKG: Sinus rhythm at 53 Intervals 10/03/45  ECG 10/1609:21 narrow QRS tachycardia cycle length 280 ms ECG 10/1609/34 atrial flutter with atrial flutter cycle length 280  ms ECG 3/21 atrial flutter   Assessment and Plan:  Atrial flutter -typical  Hypertension  COPD/emphysema  The patient has atrial flutter which has been recurrent. She is on anticoagulation but has reverted spontaneously now to sinus rhythm.  We have discussed thromboembolic risks related to atrial flutter and the need for anticoagulation. We have discussed catheter ablation as an alternative strategy to prevent recurrences and to potentially decrease the risks of thromboembolism. We we included the issue of atrial fibrillation ensuing as well as procedural risks. She would like to proceed with catheter ablation. It would be done under general anesthesia.       Sherryl Manges

## 2017-01-29 NOTE — Patient Instructions (Signed)
Medication Instructions: - Your physician recommends that you continue on your current medications as directed. Please refer to the Current Medication list given to you today.  Labwork: - Your physician recommends that you return for lab work & an EKG with the nurse on Monday 02/02/17- (BMP/CBC)  Procedures/Testing: - Your physician has recommended that you have an atrial flutter ablation. Catheter ablation is a medical procedure used to treat some cardiac arrhythmias (irregular heartbeats). During catheter ablation, a long, thin, flexible tube is put into a blood vessel in your groin (upper thigh), or neck. This tube is called an ablation catheter. It is then guided to your heart through the blood vessel. Radio frequency waves destroy small areas of heart tissue where abnormal heartbeats may cause an arrhythmia to start. Please see the instruction sheet given to you today.  Follow-Up: - Your physician recommends that you schedule a follow-up appointment in: 4 weeks (from 02/04/17) with Dr. Graciela Husbands.   Any Additional Special Instructions Will Be Listed Below (If Applicable).     If you need a refill on your cardiac medications before your next appointment, please call your pharmacy.

## 2017-02-02 ENCOUNTER — Ambulatory Visit (INDEPENDENT_AMBULATORY_CARE_PROVIDER_SITE_OTHER): Payer: Medicare Other | Admitting: *Deleted

## 2017-02-02 ENCOUNTER — Other Ambulatory Visit (INDEPENDENT_AMBULATORY_CARE_PROVIDER_SITE_OTHER): Payer: Medicare Other | Admitting: *Deleted

## 2017-02-02 ENCOUNTER — Telehealth: Payer: Self-pay | Admitting: Internal Medicine

## 2017-02-02 VITALS — HR 55 | Ht 63.0 in | Wt 151.5 lb

## 2017-02-02 DIAGNOSIS — R000 Tachycardia, unspecified: Secondary | ICD-10-CM | POA: Diagnosis not present

## 2017-02-02 DIAGNOSIS — Z01812 Encounter for preprocedural laboratory examination: Secondary | ICD-10-CM

## 2017-02-02 DIAGNOSIS — Z01818 Encounter for other preprocedural examination: Secondary | ICD-10-CM | POA: Diagnosis not present

## 2017-02-02 DIAGNOSIS — I4892 Unspecified atrial flutter: Secondary | ICD-10-CM | POA: Diagnosis not present

## 2017-02-02 NOTE — Telephone Encounter (Signed)
Patient has been using nicotine patches to try to quit smoking and she read that one of the side effects is a racing hr .  Patient removed the patch on saturday about lunch time and has not replace sx have improved.  Patient has not had a hr above 100.  She states hr has avg 50-60's

## 2017-02-02 NOTE — Progress Notes (Signed)
1.) Reason for visit: EKG check and labs preoperative for A flutter ablation on 02/04/17 with Dr Graciela Husbands.  2.) Name of MD requesting visit: Dr Graciela Husbands  3.) H&P: hx Atrial flutter  4.) ROS related to problem: Patient here today and stated she's been using Nicotine patches and read that it can increase heart rate. She stopped the Nicotine patches on Saturday and has not had a heartbeat above 100. States her heart rate has remained in the 50-60's.  5.) Assessment and plan per MD: EKG shown to Dr Mariah Milling. EKG shows sinus bradycardia. Dr Mariah Milling advised patient to continue current medications and defer management of Eliquis to Dr Graciela Husbands. Patient stated Dr Graciela Husbands told her to continue the Eliquis. Did not see in office visit note from 01/29/17 specific instructions for Eliquis. Will route to Dr Graciela Husbands.

## 2017-02-02 NOTE — Telephone Encounter (Signed)
Reviewed with Dr. Graciela Husbands- should be no association between the patient using nicotine patches and her atrial flutter. I have reviewed this with the patient.  She states she has had not fast heart rates since stopping the patches. I advised her that her a-flutter should not be linked to this. She is aware that she was sinus brady in the office last week as she was today- I advised her to go ahead an start to hold her eliquis for her ablation on 02/04/17. She voices understanding.

## 2017-02-02 NOTE — Telephone Encounter (Signed)
Spoke with patient in the office. Patient said she has been without "racing heartbeat" since she stopped using the Nicotine patches this past Saturday. She says this all started with her heart when she first began with the patches. She wonders if this was the cause of the tachycardia and is it still necessary to have the ablation. Patient in Sinus bradycardia today, EKG shown to Dr Mariah Milling. Did not see in OV note from 01/29/17 with Dr Graciela Husbands instructions regarding Eliquis at this time. Patient advised to continue current medications including Eliquis and I will route our conversation to Dr Graciela Husbands and his nurse.

## 2017-02-03 LAB — CBC WITH DIFFERENTIAL/PLATELET
Basophils Absolute: 0 10*3/uL (ref 0.0–0.2)
Basos: 0 %
EOS (ABSOLUTE): 0.1 10*3/uL (ref 0.0–0.4)
Eos: 2 %
HEMATOCRIT: 43 % (ref 34.0–46.6)
Hemoglobin: 15.1 g/dL (ref 11.1–15.9)
IMMATURE GRANS (ABS): 0 10*3/uL (ref 0.0–0.1)
Immature Granulocytes: 0 %
LYMPHS: 42 %
Lymphocytes Absolute: 2.7 10*3/uL (ref 0.7–3.1)
MCH: 33.2 pg — ABNORMAL HIGH (ref 26.6–33.0)
MCHC: 35.1 g/dL (ref 31.5–35.7)
MCV: 95 fL (ref 79–97)
Monocytes Absolute: 0.4 10*3/uL (ref 0.1–0.9)
Monocytes: 6 %
NEUTROS ABS: 3.2 10*3/uL (ref 1.4–7.0)
Neutrophils: 50 %
Platelets: 202 10*3/uL (ref 150–379)
RBC: 4.55 x10E6/uL (ref 3.77–5.28)
RDW: 13.5 % (ref 12.3–15.4)
WBC: 6.4 10*3/uL (ref 3.4–10.8)

## 2017-02-03 LAB — BASIC METABOLIC PANEL
BUN/Creatinine Ratio: 17 (ref 12–28)
BUN: 13 mg/dL (ref 8–27)
CALCIUM: 9.3 mg/dL (ref 8.7–10.3)
CO2: 23 mmol/L (ref 18–29)
CREATININE: 0.78 mg/dL (ref 0.57–1.00)
Chloride: 104 mmol/L (ref 96–106)
GFR calc Af Amer: 87 mL/min/{1.73_m2} (ref >59)
GFR calc non Af Amer: 75 mL/min/{1.73_m2} (ref >59)
Glucose: 103 mg/dL — ABNORMAL HIGH (ref 65–99)
POTASSIUM: 4.5 mmol/L (ref 3.5–5.2)
SODIUM: 144 mmol/L (ref 134–144)

## 2017-02-04 ENCOUNTER — Ambulatory Visit (HOSPITAL_COMMUNITY): Payer: Medicare Other | Admitting: Anesthesiology

## 2017-02-04 ENCOUNTER — Ambulatory Visit: Payer: Medicare Other | Admitting: Cardiology

## 2017-02-04 ENCOUNTER — Ambulatory Visit (HOSPITAL_COMMUNITY)
Admission: RE | Admit: 2017-02-04 | Discharge: 2017-02-04 | Disposition: A | Discharge status: Home / Self Care | Payer: Medicare Other | Source: Ambulatory Visit | Attending: Internal Medicine | Admitting: Internal Medicine

## 2017-02-04 ENCOUNTER — Ambulatory Visit (HOSPITAL_COMMUNITY)
Admission: RE | Disposition: A | Discharge status: Home / Self Care | Payer: Self-pay | Source: Ambulatory Visit | Attending: Internal Medicine

## 2017-02-04 DIAGNOSIS — G609 Hereditary and idiopathic neuropathy, unspecified: Secondary | ICD-10-CM | POA: Insufficient documentation

## 2017-02-04 DIAGNOSIS — F17210 Nicotine dependence, cigarettes, uncomplicated: Secondary | ICD-10-CM | POA: Diagnosis not present

## 2017-02-04 DIAGNOSIS — Z7901 Long term (current) use of anticoagulants: Secondary | ICD-10-CM | POA: Insufficient documentation

## 2017-02-04 DIAGNOSIS — I483 Typical atrial flutter: Secondary | ICD-10-CM | POA: Insufficient documentation

## 2017-02-04 DIAGNOSIS — Z66 Do not resuscitate: Secondary | ICD-10-CM | POA: Diagnosis not present

## 2017-02-04 DIAGNOSIS — I4891 Unspecified atrial fibrillation: Secondary | ICD-10-CM | POA: Insufficient documentation

## 2017-02-04 DIAGNOSIS — Z7982 Long term (current) use of aspirin: Secondary | ICD-10-CM | POA: Insufficient documentation

## 2017-02-04 DIAGNOSIS — F329 Major depressive disorder, single episode, unspecified: Secondary | ICD-10-CM | POA: Diagnosis not present

## 2017-02-04 DIAGNOSIS — I4892 Unspecified atrial flutter: Secondary | ICD-10-CM | POA: Diagnosis not present

## 2017-02-04 DIAGNOSIS — J449 Chronic obstructive pulmonary disease, unspecified: Secondary | ICD-10-CM | POA: Diagnosis not present

## 2017-02-04 DIAGNOSIS — F419 Anxiety disorder, unspecified: Secondary | ICD-10-CM | POA: Diagnosis not present

## 2017-02-04 DIAGNOSIS — I10 Essential (primary) hypertension: Secondary | ICD-10-CM | POA: Diagnosis not present

## 2017-02-04 DIAGNOSIS — Z880 Allergy status to penicillin: Secondary | ICD-10-CM | POA: Diagnosis not present

## 2017-02-04 DIAGNOSIS — F418 Other specified anxiety disorders: Secondary | ICD-10-CM | POA: Diagnosis not present

## 2017-02-04 HISTORY — PX: A-FLUTTER ABLATION: EP1230

## 2017-02-04 SURGERY — A-FLUTTER ABLATION
Anesthesia: General

## 2017-02-04 MED ORDER — GLYCOPYRROLATE 0.2 MG/ML IJ SOLN
INTRAMUSCULAR | Status: DC | PRN
  Administered 2017-02-04: 0.1 mg via INTRAVENOUS

## 2017-02-04 MED ORDER — FENTANYL CITRATE (PF) 100 MCG/2ML IJ SOLN
INTRAMUSCULAR | Status: DC | PRN
  Administered 2017-02-04 (×2): 50 ug via INTRAVENOUS
  Administered 2017-02-04: 25 ug via INTRAVENOUS
  Administered 2017-02-04: 50 ug via INTRAVENOUS

## 2017-02-04 MED ORDER — LIDOCAINE HCL (CARDIAC) 20 MG/ML IV SOLN
INTRAVENOUS | Status: DC | PRN
  Administered 2017-02-04: 30 mg via INTRAVENOUS

## 2017-02-04 MED ORDER — PHENYLEPHRINE HCL 10 MG/ML IJ SOLN
INTRAMUSCULAR | Status: DC | PRN
  Administered 2017-02-04 (×2): 80 ug via INTRAVENOUS

## 2017-02-04 MED ORDER — EPHEDRINE SULFATE 50 MG/ML IJ SOLN
INTRAMUSCULAR | Status: DC | PRN
  Administered 2017-02-04: 10 mg via INTRAVENOUS

## 2017-02-04 MED ORDER — LACTATED RINGERS IV SOLN
INTRAVENOUS | Status: DC | PRN
  Administered 2017-02-04: 08:00:00 via INTRAVENOUS

## 2017-02-04 MED ORDER — ROCURONIUM BROMIDE 100 MG/10ML IV SOLN
INTRAVENOUS | Status: DC | PRN
  Administered 2017-02-04: 50 mg via INTRAVENOUS

## 2017-02-04 MED ORDER — PROPOFOL 10 MG/ML IV BOLUS
INTRAVENOUS | Status: DC | PRN
  Administered 2017-02-04: 50 mg via INTRAVENOUS
  Administered 2017-02-04: 100 mg via INTRAVENOUS

## 2017-02-04 MED ORDER — SODIUM CHLORIDE 0.9% FLUSH: 3.0000 mL | Freq: Two times a day (BID) | INTRAVENOUS | Status: DC

## 2017-02-04 MED ORDER — HEPARIN (PORCINE) IN NACL 2-0.9 UNIT/ML-% IJ SOLN
INTRAMUSCULAR | Status: AC
  Filled 2017-02-04: qty 500

## 2017-02-04 MED ORDER — SODIUM CHLORIDE 0.9 % IV SOLN: 250.0000 mL | INTRAVENOUS | Status: DC | PRN

## 2017-02-04 MED ORDER — BUPIVACAINE HCL (PF) 0.25 % IJ SOLN
INTRAMUSCULAR | Status: AC
  Filled 2017-02-04: qty 60

## 2017-02-04 MED ORDER — ONDANSETRON HCL 4 MG/2ML IJ SOLN
INTRAMUSCULAR | Status: DC | PRN
  Administered 2017-02-04: 4 mg via INTRAVENOUS

## 2017-02-04 MED ORDER — HEPARIN (PORCINE) IN NACL 2-0.9 UNIT/ML-% IJ SOLN
INTRAMUSCULAR | Status: DC | PRN
  Administered 2017-02-04: 09:00:00

## 2017-02-04 MED ORDER — PHENYLEPHRINE HCL 10 MG/ML IJ SOLN
INTRAVENOUS | Status: DC | PRN
  Administered 2017-02-04: 35 ug/min via INTRAVENOUS

## 2017-02-04 MED ORDER — SUGAMMADEX SODIUM 200 MG/2ML IV SOLN
INTRAVENOUS | Status: DC | PRN
  Administered 2017-02-04: 200 mg via INTRAVENOUS

## 2017-02-04 MED ORDER — SODIUM CHLORIDE 0.9% FLUSH: 3.0000 mL | INTRAVENOUS | Status: DC | PRN

## 2017-02-04 MED ORDER — SODIUM CHLORIDE 0.9 % IV SOLN
INTRAVENOUS | Status: AC
  Administered 2017-02-04: 50 mL/h via INTRAVENOUS

## 2017-02-04 SURGICAL SUPPLY — 13 items
BAG SNAP BAND KOVER 36X36 (MISCELLANEOUS) ×3 IMPLANT
CATH BLAZERPRIME XP LG CV 10MM (ABLATOR) ×3 IMPLANT
CATH DUODECA/ISMUS 7FR REPROC (CATHETERS) ×3 IMPLANT
CATH OCTAPOLOR 6F 125CM 2-5-2 (CATHETERS) ×3 IMPLANT
CATH QUAD COURNAND 5FR (CATHETERS) ×3 IMPLANT
PACK EP LATEX FREE (CUSTOM PROCEDURE TRAY) ×3
PACK EP LF (CUSTOM PROCEDURE TRAY) ×1 IMPLANT
PAD DEFIB LIFELINK (PAD) ×3 IMPLANT
SHEATH ATRIAL FLUTTER SAFL 8F (SHEATH) ×3 IMPLANT
SHEATH PINNACLE 6F 10CM (SHEATH) ×3 IMPLANT
SHEATH PINNACLE 7F 10CM (SHEATH) ×3 IMPLANT
SHEATH PINNACLE 8F 10CM (SHEATH) ×6 IMPLANT
SHIELD RADPAD SCOOP 12X17 (MISCELLANEOUS) ×3 IMPLANT

## 2017-02-04 NOTE — H&P (View-Only) (Signed)
    ELECTROPHYSIOLOGY CONSULT NOTE  Patient ID: Doylene K Marohl, MRN: [id number], DOB/AGE: 75/21/1942 75 y.o. Admit date: (Not on file) Date of Consult: 01/29/2017  Primary Physician: Richard Letvak, MD Primary Cardiologist: A Ingal   Yoselyn K Somero is being seen today for the evaluation of Atrial flutter at the request of Dr Ingal   HPI Emali K Chittum is a 76 y.o. female  Referred because of recurrent atrial flutter. Initially presenting 11/11/16 with tachypalpitations there is spontaneous reversion to sinus rhythm wherein she was noted to be bradycardic. Evaluation at that time included a negative CTA and echocardiogram demonstrating normal LV function and mild biatrial enlargement.  Thromboembolic risk profile is notable for age-1 hypertension-1 for a CHADS-VASc score of 3  She is currently managed with apixoban recently started with the discontinuation at that time of aspirin  She is a long-time smoker and has been told she has Hyperinflation consistent with emphysema  She's trying to stop smoking.  She has lost her husband about 5 years ago following strokes and her son 13 years ago to suicide     Past Medical History:  Diagnosis Date  . Depression   . Neuropathy (HCC)    idiopathic      Surgical History:  Past Surgical History:  Procedure Laterality Date  . FOOT SURGERY  1995   bilateral   . SPINAL CORD STIMULATOR IMPLANT  12/1996   didn't keep  . VAGINAL DELIVERY  1963   X 1  . VAGINAL HYSTERECTOMY  1974     Home Meds: Prior to Admission medications   Medication Sig Start Date End Date Taking? Authorizing Provider  ALPRAZolam (XANAX) 0.25 MG tablet Take 1/2 to 1 tablet by mouth twice a day as needed for panic symptoms. 01/21/17  Yes Richard I Letvak, MD  apixaban (ELIQUIS) 5 MG TABS tablet Take 1 tablet (5 mg total) by mouth 2 (two) times daily. 01/14/17  Yes Aileen Ingal, MD  aspirin 81 MG tablet Take 81 mg by mouth daily.     Yes Historical Provider, MD   calcium carbonate (OS-CAL) 600 MG TABS Take 600 mg by mouth daily.     Yes Historical Provider, MD  diltiazem (CARDIZEM) 30 MG tablet Take 1 tablet (30 mg total) by mouth as directed. Take for heart rate greater than 130 01/14/17  Yes Aileen Ingal, MD  gabapentin (NEURONTIN) 600 MG tablet TAKE ONE TABLET BY MOUTH AT BEDTIME 02/05/16  Yes Richard I Letvak, MD    Allergies:  Allergies  Allergen Reactions  . Cymbalta [Duloxetine Hcl] Nausea Only  . Penicillins     Has patient had a PCN reaction causing immediate rash, facial/tongue/throat swelling, SOB or lightheadedness with hypotension yes Has patient had a PCN reaction causing severe rash involving mucus membranes or skin necrosis: yes Has patient had a PCN reaction that required hospitalization no Has patient had a PCN reaction occurring within the last 10 years: no If all of the above answers are "NO", then may proceed with Cephalosporin use.  . Wellbutrin [Bupropion] Other (See Comments)    Elevated blood pressure, anxiety    Social History   Social History  . Marital status: Widowed    Spouse name: N/A  . Number of children: 1  . Years of education: N/A   Occupational History  . Retired, [employer] ([employer])    Social History Main Topics  . Smoking status: Current Some Day Smoker    Packs/day: 0.25    Types:   Cigarettes  . Smokeless tobacco: Never Used     Comment: 2-3 a day  . Alcohol use No  . Drug use: No  . Sexual activity: Not on file   Other Topics Concern  . Not on file   Social History Narrative   Husband died 10/14      Has living will   Has health care POA--- Rhonda Thomas, friend   Requests DNR--- form done 12/22/14   No tube feedings if cognitively unaware     Family History  Problem Relation Age of Onset  . Cancer Father     colon  . Heart attack Brother   . Diabetes Maternal Grandfather      ROS:  Please see the history of present illness.     All other systems reviewed and negative.     Physical Exam: Blood pressure (!) 174/74, pulse (!) 53, height 5' 3" (1.6 m), weight 154 lb 4 oz (70 kg). General: Well developed, well nourished female in no acute distress. Head: Normocephalic, atraumatic, sclera non-icteric, no xanthomas, nares are without discharge. EENT: normal  Lymph Nodes:  none Neck: Negative for carotid bruits. JVD not elevated. Back:without scoliosis kyphosis Lungs: Clear bilaterally to auscultation without wheezes, rales, or rhonchi. Breathing is unlabored. Breath sounds are decreased. Increased AP diameter Heart: RRR with S1 S2. No  murmur . No rubs, or gallops appreciated. Abdomen: Soft, non-tender, non-distended with normoactive bowel sounds. No hepatomegaly. No rebound/guarding. No obvious abdominal masses. Msk:  Strength and tone appear normal for age. Extremities: No clubbing or cyanosis. No edema.  Distal pedal pulses are 2+ and equal bilaterally. Skin: Warm and Dry Neuro: Alert and oriented X 3. CN III-XII intact Grossly normal sensory and motor function . Psych:  Responds to questions appropriately with a normal affect.      Labs: Cardiac Enzymes No results for input(s): CKTOTAL, CKMB, TROPONINI in the last 72 hours. CBC Lab Results  Component Value Date   WBC 7.2 01/25/2017   HGB 15.6 01/25/2017   HCT 46.2 01/25/2017   MCV 96.7 01/25/2017   PLT 188 01/25/2017   PROTIME: No results for input(s): LABPROT, INR in the last 72 hours. Chemistry  Recent Labs Lab 01/25/17 1614  NA 140  K 3.7  CL 104  CO2 29  BUN 11  CREATININE 0.75  CALCIUM 9.2  PROT 6.8  BILITOT 0.5  ALKPHOS 67  ALT 12*  AST 18  GLUCOSE 89   Lipids No results found for: CHOL, HDL, LDLCALC, TRIG BNP No results found for: PROBNP Thyroid Function Tests: No results for input(s): TSH, T4TOTAL, T3FREE, THYROIDAB in the last 72 hours.  Invalid input(s): FREET3 Miscellaneous No results found for: DDIMER  Radiology/Studies:  Dg Chest 2 View  Result Date:  01/25/2017 CLINICAL DATA:  Initial evaluation for acute chest tightness. EXAM: CHEST  2 VIEW COMPARISON:  Prior radiograph from 11/15/2016. FINDINGS: Cardiac and mediastinal silhouettes are stable in size and contour, and remain within normal limits. Aortic atherosclerosis noted. Lungs are hyperinflated with increased AP diameter seen on lateral projection. No focal infiltrates. No pulmonary edema or pleural effusion. No pneumothorax. No acute osseous abnormality.  Osteopenia noted. IMPRESSION: 1. Hyperinflation.  No other active cardiopulmonary disease. 2. Aortic atherosclerosis. Electronically Signed   By: Benjamin  McClintock M.D.   On: 01/25/2017 16:34    EKG: Sinus rhythm at 53 Intervals 10/03/45  ECG 10/1609:21 narrow QRS tachycardia cycle length 280 ms ECG 10/1609/34 atrial flutter with atrial flutter cycle length 280   ms ECG 3/21 atrial flutter   Assessment and Plan:  Atrial flutter -typical  Hypertension  COPD/emphysema  The patient has atrial flutter which has been recurrent. She is on anticoagulation but has reverted spontaneously now to sinus rhythm.  We have discussed thromboembolic risks related to atrial flutter and the need for anticoagulation. We have discussed catheter ablation as an alternative strategy to prevent recurrences and to potentially decrease the risks of thromboembolism. We we included the issue of atrial fibrillation ensuing as well as procedural risks. She would like to proceed with catheter ablation. It would be done under general anesthesia.       Steven Klein  

## 2017-02-04 NOTE — Anesthesia Preprocedure Evaluation (Addendum)
Anesthesia Evaluation  Patient identified by MRN, date of birth, ID band Patient awake    Reviewed: Allergy & Precautions, NPO status , Patient's Chart, lab work & pertinent test results  Airway Mallampati: II  TM Distance: >3 FB Neck ROM: Full    Dental no notable dental hx. (+) Edentulous Upper, Edentulous Lower, Implants   Pulmonary neg pulmonary ROS, COPD, Current Smoker,    Pulmonary exam normal breath sounds clear to auscultation       Cardiovascular hypertension, On Medications negative cardio ROS Normal cardiovascular exam+ dysrhythmias Atrial Fibrillation  Rhythm:Regular Rate:Normal     Neuro/Psych PSYCHIATRIC DISORDERS Anxiety Depression  Neuromuscular disease negative neurological ROS  negative psych ROS   GI/Hepatic negative GI ROS, Neg liver ROS,   Endo/Other  negative endocrine ROS  Renal/GU negative Renal ROS  negative genitourinary   Musculoskeletal negative musculoskeletal ROS (+)   Abdominal   Peds negative pediatric ROS (+)  Hematology negative hematology ROS (+)   Anesthesia Other Findings   Reproductive/Obstetrics negative OB ROS                           Anesthesia Physical Anesthesia Plan  ASA: III  Anesthesia Plan: General   Post-op Pain Management:    Induction: Intravenous  Airway Management Planned: Oral ETT  Additional Equipment:   Intra-op Plan:   Post-operative Plan: Extubation in OR  Informed Consent: I have reviewed the patients History and Physical, chart, labs and discussed the procedure including the risks, benefits and alternatives for the proposed anesthesia with the patient or authorized representative who has indicated his/her understanding and acceptance.   Dental advisory given and Dental Advisory Given  Plan Discussed with: CRNA  Anesthesia Plan Comments:        Anesthesia Quick Evaluation

## 2017-02-04 NOTE — Anesthesia Postprocedure Evaluation (Signed)
Anesthesia Post Note  Patient: Heidi Carroll  Procedure(s) Performed: Procedure(s) (LRB): A-Flutter Ablation (N/A)  Patient location during evaluation: PACU Anesthesia Type: General Level of consciousness: awake and alert Pain management: pain level controlled Vital Signs Assessment: post-procedure vital signs reviewed and stable Respiratory status: spontaneous breathing, nonlabored ventilation and respiratory function stable Cardiovascular status: blood pressure returned to baseline and stable Postop Assessment: no signs of nausea or vomiting Anesthetic complications: no       Last Vitals:  Vitals:   02/04/17 1030 02/04/17 1034  BP: (!) 141/52   Pulse: 64   Resp: (!) 22   Temp:  36.4 C    Last Pain:  Vitals:   02/04/17 0631  TempSrc: Oral                 Lowella Curb

## 2017-02-04 NOTE — Progress Notes (Addendum)
Site area: Left groin a 6, and 8 french venous sheath was removed  Site Prior to Removal:  Level 0  Pressure Applied For 15 MINUTES    Bedrest Beginning at 1050a  Manual:   Yes.    Patient Status During Pull:  stable  Post Pull Groin Site:  Level 0  Post Pull Instructions Given:  Yes.    Post Pull Pulses Present:  Yes.    Dressing Applied:  Yes.    Comments:  VS remain stable during sheath pull

## 2017-02-04 NOTE — Transfer of Care (Signed)
Immediate Anesthesia Transfer of Care Note  Patient: Heidi Carroll  Procedure(s) Performed: Procedure(s): A-Flutter Ablation (N/A)  Patient Location: Cath Lab  Anesthesia Type:General  Level of Consciousness: awake, alert  and oriented  Airway & Oxygen Therapy: Patient Spontanous Breathing and Patient connected to nasal cannula oxygen  Post-op Assessment: Report given to RN, Post -op Vital signs reviewed and stable and Patient moving all extremities X 4  Post vital signs: Reviewed and stable  Last Vitals:  Vitals:   02/04/17 0631  BP: (!) 185/71  Pulse: (!) 58  Resp: 20  Temp: 36.4 C    Last Pain:  Vitals:   02/04/17 0631  TempSrc: Oral         Complications: No apparent anesthesia complications

## 2017-02-04 NOTE — Interval H&P Note (Signed)
History and Physical Interval Note:  02/04/2017 8:11 AM  Heidi Carroll  has presented today for surgery, with the diagnosis of aflutter  The various methods of treatment have been discussed with the patient and family. After consideration of risks, benefits and other options for treatment, the patient has consented to  Procedure(s): A-Flutter Ablation (N/A) as a surgical intervention .  The patient's history has been reviewed, patient examined, no change in status, stable for surgery.  I have reviewed the patient's chart and labs.  Questions were answered to the patient's satisfaction.     Sherryl Manges  Reviewed with pt and anesthesia

## 2017-02-04 NOTE — Discharge Instructions (Signed)
Femoral Site Care Refer to this sheet in the next few weeks. These instructions provide you with information about caring for yourself after your procedure. Your health care provider may also give you more specific instructions. Your treatment has been planned according to current medical practices, but problems sometimes occur. Call your health care provider if you have any problems or questions after your procedure. What can I expect after the procedure? After your procedure, it is typical to have the following:  Bruising at the site that usually fades within 1-2 weeks.  Blood collecting in the tissue (hematoma) that may be painful to the touch. It should usually decrease in size and tenderness within 1-2 weeks. Follow these instructions at home:  Take medicines only as directed by your health care provider.  You may shower 24-48 hours after the procedure or as directed by your health care provider. Remove the bandage (dressing) and gently wash the site with plain soap and water. Pat the area dry with a clean towel. Do not rub the site, because this may cause bleeding.  Do not take baths, swim, or use a hot tub until your health care provider approves.  Check your insertion site every day for redness, swelling, or drainage.  Do not apply powder or lotion to the site.  Limit use of stairs to twice a day for the first 2-3 days or as directed by your health care provider.  Do not squat for the first 2-3 days or as directed by your health care provider.  Do not lift over 10 lb (4.5 kg) for 5 days after your procedure or as directed by your health care provider.  Ask your health care provider when it is okay to:  Return to work or school.  Resume usual physical activities or sports.  Resume sexual activity.  Do not drive home if you are discharged the same day as the procedure. Have someone else drive you.  You may drive 24 hours after the procedure unless otherwise instructed by  your health care provider.  Do not operate machinery or power tools for 24 hours after the procedure or as directed by your health care provider.  If your procedure was done as an outpatient procedure, which means that you went home the same day as your procedure, a responsible adult should be with you for the first 24 hours after you arrive home.  Keep all follow-up visits as directed by your health care provider. This is important. Contact a health care provider if:  You have a fever.  You have chills.  You have increased bleeding from the site. Hold pressure on the site. Get help right away if:  You have unusual pain at the site.  You have redness, warmth, or swelling at the site.  You have drainage (other than a small amount of blood on the dressing) from the site.  The site is bleeding, and the bleeding does not stop after 30 minutes of holding steady pressure on the site.  Your leg or foot becomes pale, cool, tingly, or numb. This information is not intended to replace advice given to you by your health care provider. Make sure you discuss any questions you have with your health care provider. Document Released: 06/16/2014 Document Revised: 03/20/2016 Document Reviewed: 05/02/2014 Elsevier Interactive Patient Education  2017 [employer]   Cardiac Ablation Cardiac ablation is a procedure to stop some heart tissue from causing problems. The heart has many electrical connections. Sometimes these connections make the heart  beat very fast or irregularly. Removing some problem areas can improve the heart rhythm or make it normal. What happens before the procedure?  Follow instructions from your doctor about what you cannot eat or drink.  Ask your doctor about:  Changing or stopping your normal medicines. This is important if you take diabetes medicines or blood thinners.  Taking medicines such as aspirin and ibuprofen. These medicines can thin your blood. Do not take these  medicines before your procedure if your doctor tells you not to.  Plan to have someone take you home.  If you will be going home right after the procedure, plan to have someone with you for 24 hours. What happens during the procedure?  To lower your risk of infection:  Your health care team will wash or sanitize their hands.  Your skin will be washed with soap.  Hair may be removed from your neck or groin.  An IV tube will be put into one of your veins.  You will be given a medicine to help you relax (sedative).  Skin on your neck or groin will be numbed.  A cut (incision) will be made in your neck or groin.  A needle will be put through your cut and into a vein in your neck or groin.  A tube (catheter) will be put into the needle. The tube will be moved to your heart. X-rays (fluoroscopy) will be used to help guide the tube.  Small devices (electrodes) on the tip of the tube will send out electrical currents.  Dye may be put through the tube. This helps your surgeon see your heart.  Electrical energy will be used to scar (ablate) some heart tissue. Your surgeon may use:  Heat (radiofrequency energy).  Laser energy.  Extreme cold (cryoablation).  The tube will be taken out.  Pressure will be held on your cut. This helps stop bleeding.  A bandage (dressing) will be put on your cut. The procedure may vary. What happens after the procedure?  You will be monitored until your medicines have worn off.  Your cut will be watched for bleeding. You will need to lie still for a few hours.  Do not drive for 24 hours or as long as your doctor tells you. Summary  Cardiac ablation is a procedure to stop some heart tissue from causing problems.  Electrical energy will be used to scar (ablate) some heart tissue. This information is not intended to replace advice given to you by your health care provider. Make sure you discuss any questions you have with your health care  provider. Document Released: 06/15/2013 Document Revised: 09/01/2016 Document Reviewed: 09/01/2016 Elsevier Interactive Patient Education  2017 [employer].    No driving for 3 days. No lifting over 5 lbs for 1 week. No sexual activity for 1 week. Keep procedure site clean & dry. If you notice increased pain, swelling, bleeding or pus, call/return!  You may shower, but no soaking baths/hot tubs/pools for 1 week.

## 2017-02-04 NOTE — Anesthesia Procedure Notes (Signed)
Procedure Name: Intubation Date/Time: 02/04/2017 8:41 AM Performed by: Neldon Newport Pre-anesthesia Checklist: Timeout performed, Patient being monitored, Suction available, Emergency Drugs available and Patient identified Patient Re-evaluated:Patient Re-evaluated prior to inductionOxygen Delivery Method: Circle system utilized Preoxygenation: Pre-oxygenation with 100% oxygen Intubation Type: IV induction Ventilation: Mask ventilation without difficulty Laryngoscope Size: Mac and 3 Grade View: Grade I Tube type: Oral Tube size: 7.0 mm Number of attempts: 1 Placement Confirmation: breath sounds checked- equal and bilateral,  positive ETCO2 and ETT inserted through vocal cords under direct vision Secured at: 21 cm Tube secured with: Tape Dental Injury: Teeth and Oropharynx as per pre-operative assessment

## 2017-02-04 NOTE — Progress Notes (Signed)
Site area: Right groin a 7, and 8 french venous sheath was removed  Site Prior to Removal:  Level 0  Pressure Applied For 15 MINUTES    Minutes Beginning at 1050a  Manual:   Yes.    Patient Status During Pull:  stable  Post Pull Groin Site:  Level 0  Post Pull Instructions Given:  Yes.    Post Pull Pulses Present:  Yes.    Dressing Applied:  Yes.    Comments:  VS remain stable during sheath pull.

## 2017-02-05 ENCOUNTER — Encounter (HOSPITAL_COMMUNITY): Payer: Self-pay | Admitting: Internal Medicine

## 2017-02-05 MED FILL — Bupivacaine HCl Preservative Free (PF) Inj 0.25%: INTRAMUSCULAR | Qty: 30 | Status: AC

## 2017-02-05 NOTE — Op Note (Signed)
NAME:  Heidi Carroll, Heidi Carroll                    ACCOUNT NO.:  MEDICAL RECORD NO.:  [id number]  LOCATION:                                 FACILITY:  PHYSICIAN:  Duke Salvia, MD, FACCDATE OF BIRTH:  [birth year]  DATE OF PROCEDURE:  02/04/2017 DATE OF DISCHARGE:                              OPERATIVE REPORT   PREOPERATIVE DIAGNOSIS:  Typical atrial flutter.  POSTOPERATIVE DIAGNOSIS:  Typical atrial flutter.  PROCEDURE:  Invasive electrophysiological study with catheter ablation.  DESCRIPTION OF PROCEDURE:  Following obtaining informed consent, the patient was brought to electrophysiology laboratory and placed on the fluoroscopic table in supine position.  After routine prep and drape, cardiac catheterization was performed with general anesthesia and adjunctive local anesthesia.  Following the procedure, the catheters were removed.  The sheaths were left in place and the patient was transferred to the holding area for sheath removal in stable condition.  Catheters are 5-French quadripolar catheter was inserted via the left femoral vein to the AV junction to measure His electrogram.  A 6-French octapolar catheter was inserted via the right femoral vein to the coronary sinus.  A 7-French dual decapolar catheter was inserted via the left femoral vein to the tricuspid anulus.  An 8-French 10 mm deflectable tip ablation catheter was inserted via the right femoral vein to mapping sites and posterior septal space.  Surface leads:  I, aVF, and V1 were monitored continuously throughout the procedure.  Following insertion of the catheters, a stimulation protocol included: 1. Incremental atrial pacing. 2. Incremental ventricular pacing. 3. Single and double atrial extrastimuli at paced cycle length of 600     milliseconds.  END-TIDAL RESULTS:  End-tidal surface electrocardiogram and intracardiac intervals:  Initial: Rhythm:  Sinus; RR interval of 1132 milliseconds; PR interval  151 milliseconds; P-wave duration 108 milliseconds; QRS duration 87 milliseconds; QT interval 460 milliseconds; AH interval 59 milliseconds; HV 44 milliseconds.  Final: Rhythm:  Sinus; RR interval 1116 milliseconds; PR interval 146 milliseconds; P-wave duration 94 milliseconds; QRS duration 108 milliseconds; QT interval 451 milliseconds; AH interval 74 milliseconds; HV interval 53 milliseconds.  AV NODAL FUNCTION:  AV Wenckebach was 300 milliseconds.  VA Wenckebach was 400 milliseconds.  TR interval was longer than RR interval.  AV nodal effective refractory period was demonstrated without dual AV nodal physiology at baseline 500:320 milliseconds.  ACCESSORY PATHWAY FUNCTION:  No evidence of accessory pathway was identified.  VENTRICULAR RESPONSE PROGRAMMED STIMULATION:  Normal for ventricular stimulation as described.  RADIOFREQUENCY ENERGY:  A total of 3.3 minutes of RF energy was delivered across the cavotricuspid isthmus with successful interruption of bidirectional cavotricuspid isthmus conduction.  FLUOROSCOPY TIME:  A total of 8.5 minutes of fluoroscopy time was utilized.  IMPRESSION: 1. Normal sinus function, although there was sinus node dysfunction     and junctional rhythm seen following sedation. 2. Abnormal atrial function manifested by sustained atrial flutter.     Successful catheter ablation of the cavotricuspid isthmus was     accomplished. 3. Normal AV nodal function with PR interval longer than RR interval,     but no jump noted. 4. Normal His-Purkinje system. 5. No accessory pathway. 6. Normal  ventricular response programmed stimulation.  SUMMARY:  In conclusion, results of electrophysiological testing confirmed cavotricuspid isthmus conduction as the substrate for the patient's typical atrial flutter.  RF energy delivered across the cavotricuspid isthmus successfully eliminated conduction thereby removing the patient's substrate for atrial  flutter.     Duke Salvia, MD, [facility]     SCK/MEDQ  D:  02/04/2017  T:  02/04/2017  Job:  [id number]

## 2017-02-06 ENCOUNTER — Encounter: Payer: Self-pay | Admitting: Internal Medicine

## 2017-02-06 ENCOUNTER — Ambulatory Visit (INDEPENDENT_AMBULATORY_CARE_PROVIDER_SITE_OTHER): Payer: Medicare Other | Admitting: Internal Medicine

## 2017-02-06 VITALS — BP 130/72 | HR 59 | Temp 98.1°F | Wt 151.0 lb

## 2017-02-06 DIAGNOSIS — J439 Emphysema, unspecified: Secondary | ICD-10-CM | POA: Diagnosis not present

## 2017-02-06 DIAGNOSIS — I483 Typical atrial flutter: Secondary | ICD-10-CM

## 2017-02-06 MED ORDER — ALPRAZOLAM 0.25 MG PO TABS: ORAL_TABLET | ORAL | 0 refills | Status: DC

## 2017-02-06 NOTE — Progress Notes (Signed)
Pre visit review using our clinic review tool, if applicable. No additional management support is needed unless otherwise documented below in the visit note. 

## 2017-02-06 NOTE — Assessment & Plan Note (Signed)
Discussed cigarette cessation without the patch

## 2017-02-06 NOTE — Assessment & Plan Note (Signed)
Successful ablation Asked her to restart ASA since off the eliquis

## 2017-02-06 NOTE — Progress Notes (Signed)
Subjective:    Patient ID: Heidi Carroll, female    DOB: September 14, 1941, 76 y.o.   MRN: [id number]  HPI Here for follow up after atrial flutter ablation Reviewed her cardiology notes and then the procedure by Dr Graciela Husbands Is off the eliquis now Discussed that she should go back on the aspirin  She was concerned that the tachycardia was in part due to the nicotine patch HR up as high as 90--didn't need the diltiazem Stopped it last week (the patch)  Still smoking ~6 per day No cough Breathing is okay--other than the pollen (mostly upper airway)  Current Outpatient Prescriptions on File Prior to Visit  Medication Sig Dispense Refill  . acetaminophen (TYLENOL) 500 MG tablet Take 500 mg by mouth daily as needed for moderate pain or headache.    . ALPRAZolam (XANAX) 0.25 MG tablet Take 1/2 to 1 tablet by mouth twice a day as needed for panic symptoms. 20 tablet 0  . Calcium Carbonate-Vitamin D (CALCIUM 600+D PO) Take 1 tablet by mouth at bedtime.    . gabapentin (NEURONTIN) 600 MG tablet TAKE ONE TABLET BY MOUTH AT BEDTIME 90 tablet 3   No current facility-administered medications on file prior to visit.     Allergies  Allergen Reactions  . Cymbalta [Duloxetine Hcl] Nausea Only  . Wellbutrin [Bupropion] Other (See Comments)    Elevated blood pressure, anxiety  . Penicillins Rash    Has patient had a PCN reaction causing immediate rash, facial/tongue/throat swelling, SOB or lightheadedness with hypotension yes Has patient had a PCN reaction causing severe rash involving mucus membranes or skin necrosis: no Has patient had a PCN reaction that required hospitalization no Has patient had a PCN reaction occurring within the last 10 years: no If all of the above answers are "NO", then may proceed with Cephalosporin use.    Past Medical History:  Diagnosis Date  . Depression   . Neuropathy (HCC)    idiopathic    Past Surgical History:  Procedure Laterality Date  . A-FLUTTER ABLATION  N/A 02/04/2017   Procedure: A-Flutter Ablation;  Surgeon: Duke Salvia, MD;  Location: [facility] INVAS[facility] CV LAB;  Service: Cardiovascular;  Laterality: N/A;  . FOOT SURGERY  1995   bilateral   . SPINAL CORD STIMULATOR IMPLANT  12/1996   didn't keep  . VAGINAL DELIVERY  1963   X 1  . VAGINAL HYSTERECTOMY  1974    Family History  Problem Relation Age of Onset  . Cancer Father     colon  . Heart attack Brother   . Diabetes Maternal Grandfather     Social History   Social History  . Marital status: Widowed    Spouse name: N/A  . Number of children: 1  . Years of education: N/A   Occupational History  . Retired, [employer])    Social History Main Topics  . Smoking status: Current Some Day Smoker    Packs/day: 0.25    Types: Cigarettes  . Smokeless tobacco: Never Used     Comment: 2-3 a day  . Alcohol use No  . Drug use: No  . Sexual activity: Not on file   Other Topics Concern  . Not on file   Social History Narrative   Husband died 08-21-23      Has living will   Has health care POA--- Chaya Jan, friend   Requests DNR--- form done 12/22/14   No tube feedings if cognitively unaware   Review  of Systems  Mood is variable--gets anxious at times. Issue of being alone. Thinks getting out will help No daily depression Sleeping okay in general Appetite is not great--thinks the eliquis may have curbed appetite     Objective:   Physical Exam  Constitutional: She appears well-nourished. No distress.  Cardiovascular: Normal rate, regular rhythm and normal heart sounds.  Exam reveals no gallop.   No murmur heard. HR 60  Pulmonary/Chest: Effort normal. No respiratory distress. She has no wheezes. She has no rales.  Musculoskeletal: She exhibits no edema.          Assessment & Plan:

## 2017-02-07 ENCOUNTER — Other Ambulatory Visit: Payer: Self-pay | Admitting: Internal Medicine

## 2017-02-08 ENCOUNTER — Telehealth: Payer: Self-pay | Admitting: Nurse Practitioner

## 2017-02-08 NOTE — Telephone Encounter (Signed)
   Pt is recently s/p RFCA for typical atrial flutter.  This afternoon, while sitting, she noted an elevation in HR to 113 bpm.  She was asymptomatic.  HR came back down to 82.  She understands that w/o an ECG it is difficult to know whether or not she had a recurrent atrial arrhythmia.  As she is asymptomatic, I do not feel that she needs to come into ER tonight.  If she has recurrent tachycardia, she will call our [facility] office in the am to come in for an ECG.  If at any point, she becomes symptomatic, she will present to the ED.  Caller verbalized understanding and was grateful for the call back.  Nicolasa Ducking, NP 02/08/2017, 5:08 PM

## 2017-02-09 ENCOUNTER — Telehealth: Payer: Self-pay | Admitting: Internal Medicine

## 2017-02-09 NOTE — Telephone Encounter (Signed)
I called and spoke with the patient. I advised her that some breakthrough of her atrial flutter is normal for about the next 3-6 months post ablation. She states she has had about 3 episodes since her ablation on 02/04/17 where her heart rates have been 94-130. She will typically run sinus brady when she is in rhythm. I have advised her if she has more frequent episodes or they are lasting longer, the most recent episodes have lasted about 1 hour, then she needs to call and let us know. She does have PRN diltiazem to use. I still forward to Dr. Graciela Husbands to review.

## 2017-02-09 NOTE — Telephone Encounter (Signed)
Do you remember calling this pts Rx in when she was in office 4/13?

## 2017-02-09 NOTE — Telephone Encounter (Signed)
Pt calling stating last Wednesday she had an ablation  And Friday she's been having some fast heart beat This morning she is a bit more normal It would come and go The highest it has gotten this past weekend was about 200  She would like to know if this is normal, or even to be expected  Please advise

## 2017-02-09 NOTE — Telephone Encounter (Signed)
Honestly, I do not remember.

## 2017-02-09 NOTE — Telephone Encounter (Signed)
Pt had 4/11 a-flutter ablation. On Friday, she experienced HR 80-94 while resting.  Sunday, HR elevated to 113 for about an hour while at rest. Eventually resolved on its own. No other symptoms associated with HR. Reviewed medication list. She has not taken any PRN diltiazem as HR has not been over 130bpm. This morning HR 50s while resting and 74bpm  while mopping the floor. Reports feeling well.  She is agreeable to continue to monitor and report any further episodes. Will route to Sherri Rad, RN, and Dr. Graciela Husbands to make aware and for further recommendations.

## 2017-02-10 NOTE — Telephone Encounter (Signed)
15 day monitor plz

## 2017-02-11 ENCOUNTER — Telehealth: Payer: Self-pay

## 2017-02-11 NOTE — Telephone Encounter (Signed)
Check with the pharmacist---give the order if they don't have a record of getting it

## 2017-02-11 NOTE — Telephone Encounter (Signed)
Called in to [facility] [address] [place] - [address] [phone] (Phone) [phone] (Fax)  I spoke with a pharmacist.

## 2017-02-11 NOTE — Telephone Encounter (Signed)
Per chart Rx was called in to pharmacy. pls advise

## 2017-02-12 NOTE — Telephone Encounter (Signed)
Spoke to pharmacy who states Rx had not been received. Rx verbal order given

## 2017-02-12 NOTE — Telephone Encounter (Signed)
Attempted to contact pharmacy to confirm receipt; opens at 0900

## 2017-02-12 NOTE — Telephone Encounter (Signed)
I called and spoke with the patient. I advised her that Dr. Odessa Fleming recommendation was for a 14 day monitor (ZIO patch). She states she has been doing well- just a couple of episodes where her HR got up to ~ 90 bpm. I advised her that we could go ahead and put a 2 week monitor on her to confirm she is not now having a-fib, or if she feels ok, she can continue to monitor and if she has more breakthrough over the weekend, she will call back next week for a monitor.  She would like to see how she does and is agreeable with calling back next week for a monitor if she has more breakthrough over the weekend.

## 2017-02-13 ENCOUNTER — Telehealth: Payer: Self-pay | Admitting: Internal Medicine

## 2017-02-13 ENCOUNTER — Ambulatory Visit: Payer: Medicare Other | Admitting: Internal Medicine

## 2017-02-13 DIAGNOSIS — R002 Palpitations: Secondary | ICD-10-CM

## 2017-02-13 NOTE — Telephone Encounter (Signed)
Pt needs a call back about the heart monitor and she would like to do it please. Please call patient with any questions.

## 2017-02-13 NOTE — Telephone Encounter (Signed)
Patient is calling and states that she has changed her mind about wearing the monitor. She states that she is interested in wearing the monitor now. Per phone note from 02/09/17 patient is to be set up with a 14 day ZIO patch. Per Katie in monitors, the ZIO patch is only placed in [place]. Patient made aware that Dr. Odessa Fleming nurse will be back in the office on Monday and that she will get this set up for her. Patient verbalized understanding and is in agreement with this plan. Message routed to [facility].

## 2017-02-15 ENCOUNTER — Other Ambulatory Visit: Payer: Self-pay | Admitting: Internal Medicine

## 2017-02-16 NOTE — Telephone Encounter (Signed)
Last OV 01/2017. Last Rx 01/2016 #90 3R. pls advise

## 2017-02-16 NOTE — Telephone Encounter (Signed)
Approved: okay for 1 year 

## 2017-02-16 NOTE — Telephone Encounter (Signed)
Patient is seen in [place]- will place order for the ZIO patch and have scheduling call to arrange.

## 2017-02-17 ENCOUNTER — Ambulatory Visit (INDEPENDENT_AMBULATORY_CARE_PROVIDER_SITE_OTHER): Payer: Medicare Other

## 2017-02-17 DIAGNOSIS — R002 Palpitations: Secondary | ICD-10-CM

## 2017-02-20 ENCOUNTER — Telehealth: Payer: Self-pay | Admitting: Internal Medicine

## 2017-02-20 NOTE — Telephone Encounter (Signed)
Patient dropped off note with BP readings as follows:  02/17/17  Noon 161/69 2:15 144/82 9:30 141/84  02/18/17 7:00 AM 143/82 12:40 PM 161/72 5:30 172/77 9:00 170/80  02/19/17 8:30 AM 146/78 12:45 143/71 6:45 147/65  02/20/17 9:30 147/72 11:00 165/73  Will forward these readings to Dr. Graciela Husbands and his nurse for review. Readings on paper in envelope placed in red folder in "To Do" bin on Pamela's desk at [facility] [place] location.

## 2017-02-20 NOTE — Telephone Encounter (Signed)
Pt came by office and dropped off her BP readings. Place in nurses in basket

## 2017-02-23 ENCOUNTER — Emergency Department: Payer: Medicare Other

## 2017-02-23 ENCOUNTER — Telehealth: Payer: Self-pay | Admitting: Internal Medicine

## 2017-02-23 ENCOUNTER — Emergency Department
Admission: EM | Admit: 2017-02-23 | Discharge: 2017-02-23 | Disposition: A | Discharge status: Home / Self Care | Payer: Medicare Other | Attending: Emergency Medicine | Admitting: Emergency Medicine

## 2017-02-23 ENCOUNTER — Other Ambulatory Visit: Payer: Self-pay

## 2017-02-23 ENCOUNTER — Encounter: Payer: Self-pay | Admitting: Emergency Medicine

## 2017-02-23 DIAGNOSIS — F17210 Nicotine dependence, cigarettes, uncomplicated: Secondary | ICD-10-CM | POA: Diagnosis not present

## 2017-02-23 DIAGNOSIS — J449 Chronic obstructive pulmonary disease, unspecified: Secondary | ICD-10-CM | POA: Diagnosis not present

## 2017-02-23 DIAGNOSIS — R009 Unspecified abnormalities of heart beat: Secondary | ICD-10-CM | POA: Diagnosis not present

## 2017-02-23 DIAGNOSIS — J90 Pleural effusion, not elsewhere classified: Secondary | ICD-10-CM | POA: Diagnosis not present

## 2017-02-23 DIAGNOSIS — R000 Tachycardia, unspecified: Secondary | ICD-10-CM | POA: Insufficient documentation

## 2017-02-23 LAB — CBC
HCT: 47.8 % — ABNORMAL HIGH (ref 35.0–47.0)
Hemoglobin: 16.2 g/dL — ABNORMAL HIGH (ref 12.0–16.0)
MCH: 33.4 pg (ref 26.0–34.0)
MCHC: 34 g/dL (ref 32.0–36.0)
MCV: 98.3 fL (ref 80.0–100.0)
PLATELETS: 217 10*3/uL (ref 150–440)
RBC: 4.86 MIL/uL (ref 3.80–5.20)
RDW: 13.2 % (ref 11.5–14.5)
WBC: 10.4 10*3/uL (ref 3.6–11.0)

## 2017-02-23 LAB — BASIC METABOLIC PANEL
Anion gap: 10 (ref 5–15)
BUN: 15 mg/dL (ref 6–20)
CALCIUM: 9.6 mg/dL (ref 8.9–10.3)
CO2: 28 mmol/L (ref 22–32)
CREATININE: 0.67 mg/dL (ref 0.44–1.00)
Chloride: 102 mmol/L (ref 101–111)
GFR calc non Af Amer: >60 mL/min (ref >60)
Glucose, Bld: 121 mg/dL — ABNORMAL HIGH (ref 65–99)
Potassium: 4.6 mmol/L (ref 3.5–5.1)
Sodium: 140 mmol/L (ref 135–145)

## 2017-02-23 LAB — TROPONIN I

## 2017-02-23 MED ORDER — AMLODIPINE BESYLATE 2.5 MG PO TABS: 2.5000 mg | ORAL_TABLET | Freq: Every day | ORAL | 3 refills | Status: DC

## 2017-02-23 NOTE — Telephone Encounter (Signed)
S/w pt who reports elevated HR over the weekend. Friday it was 120bpm. She took 1/2 cardizem w/improvement in HR. Saturday HR 122 but resolved on its own.  Pt currently has one more week to wear Zio monitor.  She is concerned as she thought the ablation would solve HR/rhythm issues. Reviewed concerns with Sherri Rad, RN, who advises pt to continue to monitor and proceed to ED if HR remains elevated for 6 hours.  Reviewed instructions w/pt who verbalized understanding. States HR at this time is 52-58. She will wear Zio patch full 2 weeks and document symptoms in the diary. Pt had no further questions at this time.

## 2017-02-23 NOTE — Telephone Encounter (Signed)
s/w pt who is agreeable to start taking amlodipine 2.5mg  and continue to monitor BP. Prescription sent to [facility].

## 2017-02-23 NOTE — Discharge Instructions (Signed)
Please seek medical attention for any high fevers, chest pain, shortness of breath, change in behavior, persistent vomiting, bloody stool or any other new or concerning symptoms.  

## 2017-02-23 NOTE — Telephone Encounter (Signed)
Pt calls back to report HR jumps to 120s "with any movement" and expresses continued concern as she feels "this isn't right". She was walking in from the store and HR noted over 120 accompanied by SOB. HR 70s-80s while sitting.  While on the phone, she stood up and reports HR increased to 102 by simply standing.  She is s/p April 11 ablation and currently wearing a Zio monitor. As she is concerned and symptomatic, I have advised pt to proceed to ER for an evaluation.  She is agreeable w/plan.

## 2017-02-23 NOTE — ED Provider Notes (Signed)
[facility] Emergency Department Provider Note   ____________________________________________   I have reviewed the triage vital signs and the nursing notes.   HISTORY  Chief Complaint Tachycardia   History limited by: Not Limited   HPI Heidi Carroll is a 76 y.o. female who presents to the emergency department today because of concerns for fast heart rate. The patient states she had an ablation 2 weeks ago for fast heart rate. She states for the past 3 days her heart rate has gone as high as 125. She states it is worst after lunch. It is worse when she gets up and walks around. She does have a ZIO patch on. She denies any associated chest pain.    Past Medical History:  Diagnosis Date  . Depression   . Neuropathy    idiopathic    Patient Active Problem List   Diagnosis Date Noted  . Typical atrial flutter (HCC) 02/04/2017  . COPD (chronic obstructive pulmonary disease) (HCC) 11/18/2016  . Mood disorder (HCC) 11/18/2016  . Elevated blood pressure, situational 11/18/2016  . Nicotine dependence 12/31/2015  . Idiopathic peripheral neuropathy 04/02/2007    Past Surgical History:  Procedure Laterality Date  . A-FLUTTER ABLATION N/A 02/04/2017   Procedure: A-Flutter Ablation;  Surgeon: Duke Salvia, MD;  Location: [facility] INVASIVE CV LAB;  Service: Cardiovascular;  Laterality: N/A;  . FOOT SURGERY  1995   bilateral   . SPINAL CORD STIMULATOR IMPLANT  12/1996   didn't keep  . VAGINAL DELIVERY  1963   X 1  . VAGINAL HYSTERECTOMY  1974    Prior to Admission medications   Medication Sig Start Date End Date Taking? Authorizing Provider  acetaminophen (TYLENOL) 500 MG tablet Take 500 mg by mouth daily as needed for moderate pain or headache.    Historical Provider, MD  ALPRAZolam (XANAX) 0.25 MG tablet TAKE 1/2 TO 1 (ONE-HALF TO ONE) TABLET BY MOUTH TWICE DAILY AS NEEDED FOR  PANIC  SYMPTOMS 02/12/17   Karie Schwalbe, MD  amLODipine (NORVASC) 2.5 MG  tablet Take 1 tablet (2.5 mg total) by mouth daily. 02/23/17 05/24/17  Duke Salvia, MD  aspirin EC 81 MG tablet Take 81 mg by mouth daily.    Historical Provider, MD  Calcium Carbonate-Vitamin D (CALCIUM 600+D PO) Take 1 tablet by mouth at bedtime.    Historical Provider, MD  diltiazem (CARDIZEM) 30 MG tablet Take 1 tablet by mouth daily as needed. If heart rate is over 130 01/14/17   Historical Provider, MD  gabapentin (NEURONTIN) 600 MG tablet TAKE ONE TABLET BY MOUTH AT BEDTIME 02/16/17   Karie Schwalbe, MD    Allergies Cymbalta [duloxetine hcl]; Wellbutrin [bupropion]; and Penicillins  Family History  Problem Relation Age of Onset  . Cancer Father     colon  . Heart attack Brother   . Diabetes Maternal Grandfather     Social History Social History  Substance Use Topics  . Smoking status: Current Some Day Smoker    Packs/day: 0.25    Types: Cigarettes  . Smokeless tobacco: Never Used     Comment: 2-3 a day  . Alcohol use No    Review of Systems Constitutional: No fever/chills Eyes: No visual changes. ENT: No sore throat. Cardiovascular: Denies chest pain. Positive for fast heart rate.  Respiratory: Denies shortness of breath. Gastrointestinal: No abdominal pain.  No nausea, no vomiting.  No diarrhea.   Genitourinary: Negative for dysuria. Musculoskeletal: Negative for back pain. Skin: Negative  for rash. Neurological: Negative for headaches, focal weakness or numbness.  ____________________________________________   PHYSICAL EXAM:  VITAL SIGNS: ED Triage Vitals  Enc Vitals Group     BP 02/23/17 1543 (!) 167/79     Pulse Rate 02/23/17 1543 90     Resp 02/23/17 1543 20     Temp 02/23/17 1543 98.3 F (36.8 C)     Temp Source 02/23/17 1543 Oral     SpO2 02/23/17 1543 97 %     Weight 02/23/17 1539 151 lb (68.5 kg)     Height --    Constitutional: Alert and oriented. Well appearing and in no distress. Eyes: Conjunctivae are normal. Normal extraocular  movements. ENT   Head: Normocephalic and atraumatic.   Nose: No congestion/rhinnorhea.   Mouth/Throat: Mucous membranes are moist.   Neck: No stridor. Hematological/Lymphatic/Immunilogical: No cervical lymphadenopathy. Cardiovascular: Normal rate, regular rhythm.  No murmurs, rubs, or gallops.  Respiratory: Normal respiratory effort without tachypnea nor retractions. Breath sounds are clear and equal bilaterally. No wheezes/rales/rhonchi. Gastrointestinal: Soft and non tender. No rebound. No guarding.  Genitourinary: Deferred Musculoskeletal: Normal range of motion in all extremities. No lower extremity edema. Neurologic:  Normal speech and language. No gross focal neurologic deficits are appreciated.  Skin:  Skin is warm, dry and intact. No rash noted. Psychiatric: Mood and affect are normal. Speech and behavior are normal. Patient exhibits appropriate insight and judgment.  ____________________________________________    LABS (pertinent positives/negatives)  Labs Reviewed  BASIC METABOLIC PANEL - Abnormal; Notable for the following:       Result Value   Glucose, Bld 121 (*)    All other components within normal limits  CBC - Abnormal; Notable for the following:    Hemoglobin 16.2 (*)    HCT 47.8 (*)    All other components within normal limits  TROPONIN I     ____________________________________________   EKG  I, Phineas Semen, attending physician, personally viewed and interpreted this EKG  EKG Time: 1540 Rate: 98 Rhythm: normal sinus rhythm Axis: normal Intervals: qtc 467 QRS: narrow, q waves V1 ST changes: no st elevation Impression: abnormal ekg   ____________________________________________    RADIOLOGY  CXR   IMPRESSION:  No acute cardiopulmonary process.    ____________________________________________   PROCEDURES  Procedures  ____________________________________________   INITIAL IMPRESSION / ASSESSMENT AND PLAN / ED  COURSE  Pertinent labs & imaging results that were available during my care of the patient were reviewed by me and considered in my medical decision making (see chart for details).  Patient presents to the emergency department today for fast heart rate. Patient however does not have a fast heart rate and emergency department. Patient's EKG does show sinus rhythm. Blood work without concerning findings. Patient already wearing a ZIO patch. Will have patient follow up with cardiologist.   ____________________________________________   FINAL CLINICAL IMPRESSION(S) / ED DIAGNOSES  Final diagnoses:  Heart rate problem     Note: This dictation was prepared with Dragon dictation. Any transcriptional errors that result from this process are unintentional     Phineas Semen, MD 02/23/17 [phone]

## 2017-02-23 NOTE — ED Triage Notes (Signed)
Pt with recent ablation and heart starting racing on Saturday pt states has been on and off with some shortness of breath.  Pt with current portable ZIO monitor.

## 2017-02-23 NOTE — Telephone Encounter (Signed)
Pt states she had a really bad weekend. States her heart has been racing all weekend, states yesterday it was 113, Saturday is was 122. States she had an ablation about 2 weeks ago. She states these episodes are constant, they occur when she just is walking in her house.  States she currently has a ZIO monitor placed.

## 2017-02-23 NOTE — Telephone Encounter (Signed)
She will need antihypertensive therapy Will begin amlodipine  2.5>> warn her about edema  Needs followup with her PCP for  BP   thnks

## 2017-02-24 ENCOUNTER — Telehealth: Payer: Self-pay | Admitting: Internal Medicine

## 2017-02-24 NOTE — Telephone Encounter (Signed)
Last Rx 02/12/2017. Last OV 02/06/2017. pls advise

## 2017-02-24 NOTE — Telephone Encounter (Signed)
Spoke to pt. Made her an appt on Friday. She said she was having a better day today. She will call if things change

## 2017-02-24 NOTE — Telephone Encounter (Signed)
Please confirm why she has been taking it more--- related to her hospital visits with her heart? Okay #20 x 0

## 2017-02-24 NOTE — Telephone Encounter (Signed)
Noted Will try to touch base with her and schedule follow up

## 2017-02-25 ENCOUNTER — Telehealth: Payer: Self-pay | Admitting: Internal Medicine

## 2017-02-25 NOTE — Telephone Encounter (Signed)
Reviewed the patient's message with Dr. Graciela Husbands. He recommends that the patient go ahead and send her ZIO patch monitor back in. She may take diltiazem 30 mg every 8 hours for fast HR's.  Will await results of the heart monitor. The patient is currently not on a blood thinner.  I have spoken with the patient and notified her of Dr. Odessa Fleming recommendations. She does have the return box for the ZIO patch and will mail this back. I advised her if she feels like her HR's are fast, she may use the diltiazem 30 mg every 8 hours as needed. We will contact her once her monitor has been resulted.  She voices understanding.

## 2017-02-25 NOTE — Telephone Encounter (Signed)
Discussed with Sherri Rad, RN who will contact Dr. Graciela Husbands for further instructions.

## 2017-02-25 NOTE — Telephone Encounter (Signed)
Pt calling stating she was in ED yesterday for fast heart beat When she went she states it was normal  But today it shot back up  She states its been from 137-96 being the lowest  Any movement she does it shoots back up Please advise.

## 2017-02-26 NOTE — Telephone Encounter (Signed)
Pt returned you call. She said the med discussed was for 20 not 30. She requesting a cb with any questions.

## 2017-02-26 NOTE — Telephone Encounter (Signed)
Spoke to pt who states she has been taking more with her "racing heart concerns" and states she will discuss it further with Dr Alphonsus SiasLetvak at her 5/4 appt. Rx called in to requested pharmacy

## 2017-02-26 NOTE — Telephone Encounter (Signed)
Will review this and her overall status at appt

## 2017-02-27 ENCOUNTER — Ambulatory Visit (INDEPENDENT_AMBULATORY_CARE_PROVIDER_SITE_OTHER): Payer: Medicare Other | Admitting: Internal Medicine

## 2017-02-27 ENCOUNTER — Encounter: Payer: Self-pay | Admitting: Internal Medicine

## 2017-02-27 VITALS — BP 134/82 | HR 61 | Temp 97.7°F | Wt 149.0 lb

## 2017-02-27 DIAGNOSIS — F321 Major depressive disorder, single episode, moderate: Secondary | ICD-10-CM | POA: Insufficient documentation

## 2017-02-27 DIAGNOSIS — I483 Typical atrial flutter: Secondary | ICD-10-CM | POA: Diagnosis not present

## 2017-02-27 DIAGNOSIS — R030 Elevated blood-pressure reading, without diagnosis of hypertension: Secondary | ICD-10-CM

## 2017-02-27 MED ORDER — SERTRALINE HCL 50 MG PO TABS: 50.0000 mg | ORAL_TABLET | Freq: Every day | ORAL | 1 refills | Status: DC

## 2017-02-27 NOTE — Progress Notes (Signed)
Subjective:    Patient ID: Heidi Carroll, female    DOB: 1941/05/29, 76 y.o.   MRN: [id number]  HPI Here for follow up of elevated BP, tachycardia "Everything is making me crazy" Feels she is over aware of everything Watching BP and heart rate Concerned about "overdoing or underdoing"  Recent ER visit for tachycardia Rate was up as high as 122 Got diltiazem 30mg  to take prn--did take 1 yesterday Sent back the monitor patch yesterday---awaiting report  Nerves are very bad "I have fought the devil in my life--but I can't do it" Using the alprazolam more often due to this Really having a hard time -- "by the end of the day, I am going crazy" Very depressed now Deceased son's birthday today--would have been 37  Current Outpatient Prescriptions on File Prior to Visit  Medication Sig Dispense Refill  . acetaminophen (TYLENOL) 500 MG tablet Take 500 mg by mouth daily as needed for moderate pain or headache.    . ALPRAZolam (XANAX) 0.25 MG tablet TAKE 1/2 TO 1 (ONE-HALF TO ONE) TABLET BY MOUTH TWICE DAILY AS NEEDED FOR  PANIC  SYMPTOMS 20 tablet 0  . aspirin EC 81 MG tablet Take 81 mg by mouth daily.    . Calcium Carbonate-Vitamin D (CALCIUM 600+D PO) Take 1 tablet by mouth at bedtime.    Marland Kitchen diltiazem (CARDIZEM) 30 MG tablet Take 1 tablet by mouth daily as needed. If heart rate is over 130    . gabapentin (NEURONTIN) 600 MG tablet TAKE ONE TABLET BY MOUTH AT BEDTIME 90 tablet 3   No current facility-administered medications on file prior to visit.     Allergies  Allergen Reactions  . Cymbalta [Duloxetine Hcl] Nausea Only  . Wellbutrin [Bupropion] Other (See Comments)    Elevated blood pressure, anxiety  . Penicillins Rash    Has patient had a PCN reaction causing immediate rash, facial/tongue/throat swelling, SOB or lightheadedness with hypotension yes Has patient had a PCN reaction causing severe rash involving mucus membranes or skin necrosis: no Has patient had a PCN reaction  that required hospitalization no Has patient had a PCN reaction occurring within the last 10 years: no If all of the above answers are "NO", then may proceed with Cephalosporin use.    Past Medical History:  Diagnosis Date  . Depression   . Neuropathy    idiopathic    Past Surgical History:  Procedure Laterality Date  . A-FLUTTER ABLATION N/A 02/04/2017   Procedure: A-Flutter Ablation;  Surgeon: Duke Salvia, MD;  Location: [facility] INVASIVE CV LAB;  Service: Cardiovascular;  Laterality: N/A;  . FOOT SURGERY  1995   bilateral   . SPINAL CORD STIMULATOR IMPLANT  12/1996   didn't keep  . VAGINAL DELIVERY  1963   X 1  . VAGINAL HYSTERECTOMY  1974    Family History  Problem Relation Age of Onset  . Cancer Father     colon  . Heart attack Brother   . Diabetes Maternal Grandfather     Social History   Social History  . Marital status: Widowed    Spouse name: N/A  . Number of children: 1  . Years of education: N/A   Occupational History  . Retired, [employer])    Social History Main Topics  . Smoking status: Current Some Day Smoker    Packs/day: 0.25    Types: Cigarettes  . Smokeless tobacco: Never Used     Comment: 2-3 a day  .  Alcohol use No  . Drug use: No  . Sexual activity: Not on file   Other Topics Concern  . Not on file   Social History Narrative   Husband died 10/14      Has living will   Has health care POA--- [name], friend   Requests DNR--- form done 12/22/14   No tube feedings if cognitively unaware   Review of Systems Not sleeping well---the alprazolam does help some Appetite is off Has thought about dying but not suicidal     Objective:   Physical Exam  Constitutional: She appears well-developed and well-nourished.  Neck: No thyromegaly present.  Cardiovascular: Normal rate, regular rhythm and normal heart sounds.  Exam reveals no gallop.   No murmur heard. Steady at 60  Pulmonary/Chest: Effort normal and breath sounds  normal. No respiratory distress. She has no wheezes. She has no rales.  Musculoskeletal: She exhibits no edema.  Lymphadenopathy:    She has no cervical adenopathy.  Psychiatric:  Tearful and depressed No delusions or hallucinations Thought processes are intact Insight and judgement appear unimpaired          Assessment & Plan:

## 2017-02-27 NOTE — Progress Notes (Signed)
Pre visit review using our clinic review tool, if applicable. No additional management support is needed unless otherwise documented below in the visit note. 

## 2017-02-27 NOTE — Assessment & Plan Note (Signed)
BP Readings from Last 3 Encounters:  02/27/17 134/82  02/23/17 (!) 147/70  02/06/17 130/72   Not taking the amlodipine I don't think she needs it

## 2017-02-27 NOTE — Assessment & Plan Note (Signed)
I doubt this has recurred but the monitor should tell us She has the diltiazem for prn (but I suspect it is sinus tach when it occurs)

## 2017-02-27 NOTE — Assessment & Plan Note (Addendum)
Now MDD complicating grieving, adjustment disorder and chronic anxiety Will start sertraline--she tolerated this in the past Discussed counseling---she will consider Follow up within 2 weeks

## 2017-03-01 ENCOUNTER — Emergency Department: Payer: Medicare Other

## 2017-03-01 ENCOUNTER — Emergency Department
Admission: EM | Admit: 2017-03-01 | Discharge: 2017-03-01 | Disposition: A | Discharge status: Home / Self Care | Payer: Medicare Other | Attending: Emergency Medicine | Admitting: Emergency Medicine

## 2017-03-01 ENCOUNTER — Encounter: Payer: Self-pay | Admitting: Emergency Medicine

## 2017-03-01 DIAGNOSIS — F17210 Nicotine dependence, cigarettes, uncomplicated: Secondary | ICD-10-CM | POA: Diagnosis not present

## 2017-03-01 DIAGNOSIS — R0789 Other chest pain: Secondary | ICD-10-CM | POA: Insufficient documentation

## 2017-03-01 DIAGNOSIS — R000 Tachycardia, unspecified: Secondary | ICD-10-CM | POA: Insufficient documentation

## 2017-03-01 DIAGNOSIS — J449 Chronic obstructive pulmonary disease, unspecified: Secondary | ICD-10-CM | POA: Diagnosis not present

## 2017-03-01 DIAGNOSIS — Z7982 Long term (current) use of aspirin: Secondary | ICD-10-CM | POA: Diagnosis not present

## 2017-03-01 DIAGNOSIS — R0600 Dyspnea, unspecified: Secondary | ICD-10-CM | POA: Diagnosis not present

## 2017-03-01 DIAGNOSIS — R110 Nausea: Secondary | ICD-10-CM | POA: Diagnosis not present

## 2017-03-01 DIAGNOSIS — Z79899 Other long term (current) drug therapy: Secondary | ICD-10-CM | POA: Insufficient documentation

## 2017-03-01 DIAGNOSIS — I700 Atherosclerosis of aorta: Secondary | ICD-10-CM | POA: Diagnosis not present

## 2017-03-01 HISTORY — DX: Unspecified atrial fibrillation: I48.91

## 2017-03-01 LAB — CBC
HEMATOCRIT: 47.6 % — AB (ref 35.0–47.0)
Hemoglobin: 16.3 g/dL — ABNORMAL HIGH (ref 12.0–16.0)
MCH: 33.1 pg (ref 26.0–34.0)
MCHC: 34.1 g/dL (ref 32.0–36.0)
MCV: 97.1 fL (ref 80.0–100.0)
Platelets: 213 10*3/uL (ref 150–440)
RBC: 4.9 MIL/uL (ref 3.80–5.20)
RDW: 13.2 % (ref 11.5–14.5)
WBC: 9.4 10*3/uL (ref 3.6–11.0)

## 2017-03-01 LAB — BASIC METABOLIC PANEL
Anion gap: 8 (ref 5–15)
BUN: 17 mg/dL (ref 6–20)
CHLORIDE: 104 mmol/L (ref 101–111)
CO2: 26 mmol/L (ref 22–32)
Calcium: 9.2 mg/dL (ref 8.9–10.3)
Creatinine, Ser: 0.71 mg/dL (ref 0.44–1.00)
GFR calc Af Amer: >60 mL/min (ref >60)
GLUCOSE: 154 mg/dL — AB (ref 65–99)
POTASSIUM: 3.8 mmol/L (ref 3.5–5.1)
Sodium: 138 mmol/L (ref 135–145)

## 2017-03-01 LAB — TROPONIN I: Troponin I: <0.03 ng/mL (ref <0.03)

## 2017-03-01 NOTE — ED Triage Notes (Signed)
Pt presents to ED via [facility] with c/o heart racing and chest tightness. Per EMS, upon arrival to patient's home pt had HR of 184, A-fib RVR. EMS reports that pt took home Diltiazem approx 30 min prior to EMS arrival, when EMS put patient on the stretcher, pt converted to A-fib at a rate of 90-103.Pt is alert and oriented, c/o midline substernal chest tightness and nausea.

## 2017-03-01 NOTE — ED Notes (Signed)
Dr. Derrill KayGoodman at bedside at this time. NAD noted. Pt resting in bed with family at bedside. Will continue to monitor for further patient needs.

## 2017-03-01 NOTE — Discharge Instructions (Signed)
Please seek medical attention for any high fevers, chest pain, shortness of breath, change in behavior, persistent vomiting, bloody stool or any other new or concerning symptoms.  

## 2017-03-01 NOTE — ED Notes (Signed)
NAD noted at time of D/C. Pt denies questions or concerns. Pt taken to the lobby via wheelchair at this time.  

## 2017-03-01 NOTE — ED Provider Notes (Signed)
[facility] Emergency Department Provider Note  ____________________________________________   I have reviewed the triage vital signs and the nursing notes.   HISTORY  Chief Complaint Tachycardia and Nausea   History limited by: Not Limited   HPI Heidi Carroll is a 76 y.o. female who presents to the emergency department today via EMS because of concerns for fast heartbeat. Patient does have a history of atrial flutter and is status post ablation. She was seen in the emergency department roughly week ago for similar episode. The patient states that today's episode started this afternoon. She did feel pressure on her chest. She taken an overdose of her diltiazem and the heart rate did improve while she was with EMS. The time my exam she is still feeling some pressure in her chest. It is moderate. Patient does have some concerns that this might be related to nicotine patches   Past Medical History:  Diagnosis Date  . A-fib (HCC)   . Depression   . Neuropathy    idiopathic    Patient Active Problem List   Diagnosis Date Noted  . MDD (major depressive disorder), single episode, moderate (HCC) 02/27/2017  . Typical atrial flutter (HCC) 02/04/2017  . COPD (chronic obstructive pulmonary disease) (HCC) 11/18/2016  . Mood disorder (HCC) 11/18/2016  . Elevated blood pressure, situational 11/18/2016  . Nicotine dependence 12/31/2015  . Idiopathic peripheral neuropathy 04/02/2007    Past Surgical History:  Procedure Laterality Date  . A-FLUTTER ABLATION N/A 02/04/2017   Procedure: A-Flutter Ablation;  Surgeon: Duke Salvia, MD;  Location: [facility] INVASIVE CV LAB;  Service: Cardiovascular;  Laterality: N/A;  . FOOT SURGERY  1995   bilateral   . SPINAL CORD STIMULATOR IMPLANT  12/1996   didn't keep  . VAGINAL DELIVERY  1963   X 1  . VAGINAL HYSTERECTOMY  1974    Prior to Admission medications   Medication Sig Start Date End Date Taking? Authorizing Provider   acetaminophen (TYLENOL) 500 MG tablet Take 500 mg by mouth daily as needed for moderate pain or headache.    [provider]  ALPRAZolam (XANAX) 0.25 MG tablet TAKE 1/2 TO 1 (ONE-HALF TO ONE) TABLET BY MOUTH TWICE DAILY AS NEEDED FOR  PANIC  SYMPTOMS 02/26/17   Tillman Abide I, MD  aspirin EC 81 MG tablet Take 81 mg by mouth daily.    [provider]  Calcium Carbonate-Vitamin D (CALCIUM 600+D PO) Take 1 tablet by mouth at bedtime.    [provider]  diltiazem (CARDIZEM) 30 MG tablet Take 1 tablet by mouth daily as needed. If heart rate is over 130 01/14/17   [provider]  gabapentin (NEURONTIN) 600 MG tablet TAKE ONE TABLET BY MOUTH AT BEDTIME 02/16/17   Tillman Abide I, MD  sertraline (ZOLOFT) 50 MG tablet Take 1 tablet (50 mg total) by mouth daily. 02/27/17   Karie Schwalbe, MD    Allergies Cymbalta [duloxetine hcl]; Wellbutrin [bupropion]; and Penicillins  Family History  Problem Relation Age of Onset  . Cancer Father     colon  . Heart attack Brother   . Diabetes Maternal Grandfather     Social History Social History  Substance Use Topics  . Smoking status: Current Some Day Smoker    Packs/day: 0.25    Types: Cigarettes  . Smokeless tobacco: Never Used     Comment: 2-3 a day  . Alcohol use No    Review of Systems Constitutional: No fever/chills Eyes: No  visual changes. ENT: No sore throat. Cardiovascular: Positive for chest tightness and fast heart rate.  Respiratory: Denies shortness of breath. Gastrointestinal: No abdominal pain.  No nausea, no vomiting.  No diarrhea.   Genitourinary: Negative for dysuria. Musculoskeletal: Negative for back pain. Skin: Negative for rash. Neurological: Negative for headaches, focal weakness or numbness.  ____________________________________________   PHYSICAL EXAM:  VITAL SIGNS: ED Triage Vitals  Enc Vitals Group     BP --      Pulse Rate 03/01/17 1646 82     Resp 03/01/17 1646 (!)  24     Temp --      Temp Source 03/01/17 1646 Oral     SpO2 03/01/17 1644 93 %     Weight 03/01/17 1646 150 lb (68 kg)     Height 03/01/17 1646 5\' 5"  (1.651 m)     Head Circumference --      Peak Flow --      Pain Score 03/01/17 1645 4   Constitutional: Alert and oriented. Well appearing and in no distress. Eyes: Conjunctivae are normal. Normal extraocular movements. ENT   Head: Normocephalic and atraumatic.   Nose: No congestion/rhinnorhea.   Mouth/Throat: Mucous membranes are moist.   Neck: No stridor. Hematological/Lymphatic/Immunilogical: No cervical lymphadenopathy. Cardiovascular: Normal rate, regular rhythm.  No murmurs, rubs, or gallops. Respiratory: Normal respiratory effort without tachypnea nor retractions. Breath sounds are clear and equal bilaterally. No wheezes/rales/rhonchi. Gastrointestinal: Soft and non tender. No rebound. No guarding.  Genitourinary: Deferred Musculoskeletal: Normal range of motion in all extremities. No lower extremity edema. Neurologic:  Normal speech and language. No gross focal neurologic deficits are appreciated.  Skin:  Skin is warm, dry and intact. No rash noted. Psychiatric: Mood and affect are normal. Speech and behavior are normal. Patient exhibits appropriate insight and judgment.  ____________________________________________    LABS (pertinent positives/negatives)  Labs Reviewed  BASIC METABOLIC PANEL - Abnormal; Notable for the following:       Result Value   Glucose, Bld 154 (*)    All other components within normal limits  CBC - Abnormal; Notable for the following:    Hemoglobin 16.3 (*)    HCT 47.6 (*)    All other components within normal limits  TROPONIN I     ____________________________________________   EKG  I, Phineas SemenGraydon Graysen Woodyard, attending physician, personally viewed and interpreted this EKG  EKG Time: 1647 Rate: 86 Rhythm: normal sinus rhythm Axis: normal Intervals: qtc 449 QRS: narrow, RSR'  in V1 ST changes: no st elevation Impression: abnormal ekg   ____________________________________________    RADIOLOGY  CXR IMPRESSION: No active cardiopulmonary disease. Aortic atherosclerosis.  ____________________________________________   PROCEDURES  Procedures  ____________________________________________   INITIAL IMPRESSION / ASSESSMENT AND PLAN / ED COURSE  Pertinent labs & imaging results that were available during my care of the patient were reviewed by me and considered in my medical decision making (see chart for details).  Patient presented to the emergency department today because of concerns for tachycardia. Upon arrival patient was oriented in a normal rhythm and rate. The patient does have his history of this happening in the past. Blood work today without any overly concerning findings. This point feel patient is safe for discharge to follow-up with cardiologist.  ____________________________________________   FINAL CLINICAL IMPRESSION(S) / ED DIAGNOSES  Final diagnoses:  Tachycardia     Note: This dictation was prepared with Dragon dictation. Any transcriptional errors that result from this process are unintentional     Derrill KayGoodman,  Dustin Flock, MD 03/01/17 1610

## 2017-03-01 NOTE — ED Notes (Signed)
Pt visualized in NAD, explained to patient was waiting for results of chest X-ray. Pt's family at bedside at this time. Pt c/o nausea. Will continue to monitor for further patient needs.

## 2017-03-02 ENCOUNTER — Encounter: Payer: Self-pay | Admitting: Internal Medicine

## 2017-03-02 ENCOUNTER — Ambulatory Visit (INDEPENDENT_AMBULATORY_CARE_PROVIDER_SITE_OTHER): Payer: Medicare Other | Admitting: Internal Medicine

## 2017-03-02 VITALS — BP 178/88 | HR 89 | Temp 97.8°F | Wt 149.2 lb

## 2017-03-02 DIAGNOSIS — R030 Elevated blood-pressure reading, without diagnosis of hypertension: Secondary | ICD-10-CM | POA: Diagnosis not present

## 2017-03-02 DIAGNOSIS — F321 Major depressive disorder, single episode, moderate: Secondary | ICD-10-CM | POA: Diagnosis not present

## 2017-03-02 DIAGNOSIS — I480 Paroxysmal atrial fibrillation: Secondary | ICD-10-CM | POA: Diagnosis not present

## 2017-03-02 DIAGNOSIS — I4891 Unspecified atrial fibrillation: Secondary | ICD-10-CM | POA: Insufficient documentation

## 2017-03-02 MED ORDER — DILTIAZEM HCL ER 120 MG PO CP24: 120.0000 mg | ORAL_CAPSULE | Freq: Every day | ORAL | 11 refills | Status: DC

## 2017-03-02 NOTE — Patient Instructions (Signed)
Please start the once a day diltiazem 120mg  today, then every morning. Please continue to take the sertraline.

## 2017-03-02 NOTE — Progress Notes (Signed)
Subjective:    Patient ID: Heidi Carroll, female    DOB: [birth year], 76 y.o.   MRN: [id number]   HPI She is here for ER follow up  She did try the sertraline--first dose 3 days ago Heart rate generally 75-80 but then had another spell HR was 188 per oximeter-- and was 185 when EMTs got there (but I am not sure ER doctor knew that) Tightness in chest with the tachycardia---- slightly today as well She did "snap out of it" ---she took 1.5 of the diltiazem HR clearly back to normal again by the time she got to the ER  Did review ER RN note EMS did confirm 184 rate--called atrial fib. Then it went down to around 100 for the transport. She felt the conversion back to normal when the nurse was putting the IV in her arm  Current Outpatient Prescriptions on File Prior to Visit  Medication Sig Dispense Refill  . acetaminophen (TYLENOL) 500 MG tablet Take 500 mg by mouth daily as needed for moderate pain or headache.    . ALPRAZolam (XANAX) 0.25 MG tablet TAKE 1/2 TO 1 (ONE-HALF TO ONE) TABLET BY MOUTH TWICE DAILY AS NEEDED FOR  PANIC  SYMPTOMS 20 tablet 0  . aspirin EC 81 MG tablet Take 81 mg by mouth daily.    . Calcium Carbonate-Vitamin D (CALCIUM 600+D PO) Take 1 tablet by mouth at bedtime.    Marland Kitchen. diltiazem (CARDIZEM) 30 MG tablet Take 1 tablet by mouth daily as needed. If heart rate is over 130    . gabapentin (NEURONTIN) 600 MG tablet TAKE ONE TABLET BY MOUTH AT BEDTIME 90 tablet 3  . sertraline (ZOLOFT) 50 MG tablet Take 1 tablet (50 mg total) by mouth daily. 30 tablet 1   No current facility-administered medications on file prior to visit.     Allergies  Allergen Reactions  . Cymbalta [Duloxetine Hcl] Nausea Only  . Wellbutrin [Bupropion] Other (See Comments)    Elevated blood pressure, anxiety  . Nicotine Palpitations    Nicotine Patches   . Penicillins Rash    Has patient had a PCN reaction causing immediate rash, facial/tongue/throat swelling, SOB or lightheadedness with  hypotension yes Has patient had a PCN reaction causing severe rash involving mucus membranes or skin necrosis: no Has patient had a PCN reaction that required hospitalization no Has patient had a PCN reaction occurring within the last 10 years: no If all of the above answers are "NO", then may proceed with Cephalosporin use.    Past Medical History:  Diagnosis Date  . A-fib (HCC)   . Depression   . Neuropathy    idiopathic    Past Surgical History:  Procedure Laterality Date  . A-FLUTTER ABLATION N/A 02/04/2017   Procedure: A-Flutter Ablation;  Surgeon: Duke SalviaSteven C Klein, MD;  [facility];  Service: Cardiovascular;  Laterality: N/A;  . FOOT SURGERY  1995   bilateral   . SPINAL CORD STIMULATOR IMPLANT  12/1996   didn't keep  . VAGINAL DELIVERY  1963   X 1  . VAGINAL HYSTERECTOMY  1974    Family History  Problem Relation Age of Onset  . Cancer Father     colon  . Heart attack Brother   . Diabetes Maternal Grandfather     Social History   Social History  . Marital status: Widowed    Spouse name: N/A  . Number of children: 1  . Years of education: N/A  Occupational History  . Retired, [employer])    Social History Main Topics  . Smoking status: Current Some Day Smoker    Packs/day: 0.25    Types: Cigarettes  . Smokeless tobacco: Never Used     Comment: 2-3 a day  . Alcohol use No  . Drug use: No  . Sexual activity: Not on file   Other Topics Concern  . Not on file   Social History Narrative   Husband died 2023-08-26      Has living will   Has health care POA--- Chaya Jan, friend   Requests DNR--- form done 12/22/14   No tube feedings if cognitively unaware   Review of Systems  Appetite is still poor Not sleeping well     Objective:   Physical Exam  Constitutional: She appears well-nourished.  Neck: No thyromegaly present.  Cardiovascular: Normal rate, regular rhythm and normal heart sounds.  Exam reveals no gallop.   No  murmur heard. Pulmonary/Chest: Effort normal and breath sounds normal. No respiratory distress. She has no wheezes. She has no rales.  Musculoskeletal: She exhibits no edema.  Lymphadenopathy:    She has no cervical adenopathy.  Psychiatric:  Very anxious and somewhat flat          Assessment & Plan:

## 2017-03-02 NOTE — Assessment & Plan Note (Addendum)
Called atrial fibrillation apparently by the EMTs Converted by the time she got to the ER Will ask Dr Graciela HusbandsKlein to review the situation and get her back in Will likely need anticoagulation again--may want to consider pradaxa (eliquis too expensive for her)

## 2017-03-02 NOTE — Assessment & Plan Note (Signed)
Discussed that I don't think the sertraline is causing the problems Asked her to continue it

## 2017-03-02 NOTE — Progress Notes (Addendum)
CARDIOLOGY OFFICE NOTE  Date:  03/03/2017    [name] Date of Birth: 09/07/1941 Medical Record #[id number]  PCP:  Karie Schwalbe, MD  Cardiologist:  Graciela Husbands  Chief Complaint  Patient presents with  . Palpitations    Work in visit for tachypalpitations - seen for Dr. Graciela Husbands    History of Present Illness: Heidi Carroll is a 76 y.o. female who presents today for a work in visit. Seen for Dr. Graciela Husbands.   She has a history of recurrent atrial flutter - s/p ablation in April per Dr. Graciela Husbands. Initially presenting 11/11/16 with tachypalpitations with spontaneous reversion to sinus rhythm but bradycardic.  Evaluation at that time included a negative CTA and echocardiogram demonstrating normal LV function and mild biatrial enlargement.  She is on Eliquis for CHADSVASC of 3. Prior smoker with emphysema. Lots of anxiety and depression - husband deceased, son committed suicide.   Numerous phone calls noted since her ablation. Referred to the ER last week with elevated HR. Given Cardizem. She currently has a monitor on. Lots of issues with depression & anxiety - son's birthday would have been last week. Just started back on SSRI therapy per PCP.   Comes in today. Here alone. Tells me she is "trying to hang in there". Concerned with "HR in the 180's" on Sunday - this is what led her to the ER by EMS - it improved by the time she arrived there.  EKG does not show this upon my review. She has worn her monitor - I am not able to see this - this was just sent back - no report yet. She is no longer on Eliquis. Just back on aspirin.  She notes she is better today but admits that this arrhythmia is "making her crazy" and she is quite worried about recurrence.  She is trying to limit her caffeine but still gets some. Anxiety and depression is a big driving force. Does not sleep all that well. No formal sleep study noted.   Past Medical History:  Diagnosis Date  . A-fib (HCC)   . Depression   .  Neuropathy    idiopathic    Past Surgical History:  Procedure Laterality Date  . A-FLUTTER ABLATION N/A 02/04/2017   Procedure: A-Flutter Ablation;  Surgeon: Duke Salvia, MD;  Location: [facility] INVASIVE CV LAB;  Service: Cardiovascular;  Laterality: N/A;  . FOOT SURGERY  1995   bilateral   . SPINAL CORD STIMULATOR IMPLANT  12/1996   didn't keep  . VAGINAL DELIVERY  1963   X 1  . VAGINAL HYSTERECTOMY  1974     Medications: Current Outpatient Prescriptions  Medication Sig Dispense Refill  . acetaminophen (TYLENOL) 500 MG tablet Take 500 mg by mouth daily as needed for moderate pain or headache.    . ALPRAZolam (XANAX) 0.25 MG tablet TAKE 1/2 TO 1 (ONE-HALF TO ONE) TABLET BY MOUTH TWICE DAILY AS NEEDED FOR  PANIC  SYMPTOMS 20 tablet 0  . aspirin EC 81 MG tablet Take 81 mg by mouth daily.    . Calcium Carbonate-Vitamin D (CALCIUM 600+D PO) Take 1 tablet by mouth at bedtime.    Marland Kitchen diltiazem (CARDIZEM) 30 MG tablet Take 1 tablet by mouth daily as needed. If heart rate is over 130    . diltiazem (DILACOR XR) 120 MG 24 hr capsule Take 1 capsule (120 mg total) by mouth daily. 30 capsule 11  . gabapentin (NEURONTIN) 600 MG tablet TAKE ONE TABLET  BY MOUTH AT BEDTIME 90 tablet 3  . sertraline (ZOLOFT) 50 MG tablet Take 1 tablet (50 mg total) by mouth daily. 30 tablet 1   No current facility-administered medications for this visit.     Allergies: Allergies  Allergen Reactions  . Cymbalta [Duloxetine Hcl] Nausea Only  . Wellbutrin [Bupropion] Other (See Comments)    Elevated blood pressure, anxiety  . Nicotine Palpitations    Nicotine Patches   . Penicillins Rash    Has patient had a PCN reaction causing immediate rash, facial/tongue/throat swelling, SOB or lightheadedness with hypotension yes Has patient had a PCN reaction causing severe rash involving mucus membranes or skin necrosis: no Has patient had a PCN reaction that required hospitalization no Has patient had a PCN reaction  occurring within the last 10 years: no If all of the above answers are "NO", then may proceed with Cephalosporin use.    Social History: The patient  reports that she has been smoking Cigarettes.  She has been smoking about 0.25 packs per day. She has never used smokeless tobacco. She reports that she does not drink alcohol or use drugs.   Family History: The patient's family history includes Cancer in her father; Diabetes in her maternal grandfather; Heart attack in her brother.   Review of Systems: Please see the history of present illness.   Otherwise, the review of systems is positive for none.   All other systems are reviewed and negative.   Physical Exam: VS:  BP (!) 150/80   Pulse (!) 55   Ht 5\' 3"  (1.6 m)   Wt 150 lb (68 kg)   SpO2 95%   BMI 26.57 kg/m  .  BMI Body mass index is 26.57 kg/m.  Wt Readings from Last 3 Encounters:  03/03/17 150 lb (68 kg)  03/02/17 149 lb 4 oz (67.7 kg)  03/01/17 150 lb (68 kg)    General: Elderly female who is alert and in no acute distress. She is anxious.   HEENT: Normal.  Neck: Supple, no JVD, carotid bruits, or masses noted.  Cardiac: Regular rate and rhythm. No murmurs, rubs, or gallops. No edema.  Respiratory:  Lungs are clear to auscultation bilaterally with normal work of breathing.  GI: Soft and nontender.  MS: No deformity or atrophy. Gait and ROM intact.  Skin: Warm and dry. Color is normal.  Neuro:  Strength and sensation are intact and no gross focal deficits noted.  Psych: Alert, appropriate and with normal affect.   LABORATORY DATA:  EKG:  EKG is ordered today. This demonstrates sinus bradycardia today. Reviewed with Dr. Ladona Ridgelaylor (DOD)  Lab Results  Component Value Date   WBC 9.4 03/01/2017   HGB 16.3 (H) 03/01/2017   HCT 47.6 (H) 03/01/2017   PLT 213 03/01/2017   GLUCOSE 154 (H) 03/01/2017   ALT 12 (L) 01/25/2017   AST 18 01/25/2017   NA 138 03/01/2017   K 3.8 03/01/2017   CL 104 03/01/2017   CREATININE 0.71  03/01/2017   BUN 17 03/01/2017   CO2 26 03/01/2017   TSH 1.677 11/11/2016   INR 0.97 11/11/2016    BNP (last 3 results) No results for input(s): BNP in the last 8760 hours.  ProBNP (last 3 results) No results for input(s): PROBNP in the last 8760 hours.   Other Studies Reviewed Today:  Echo Study Conclusions from 10/2016  - Left ventricle: The cavity size was normal. Wall thickness was   normal. Systolic function was normal. The estimated  ejection   fraction was in the range of 55% to 65%. - Aortic valve: There was mild regurgitation. Valve area (Vmax):   2.09 cm^2. - Mitral valve: There was mild regurgitation. - Right atrium: The atrium was mildly dilated.  Assessment/Plan:  1. Atrial flutter - s/p ablation 01/2017 per Dr. Graciela Husbands - she has had a monitor in place - not able to see these results - just sent back.   2. Palpitations - worrisome for recurrent arrhythmia - no longer on anticoagulation other than aspirin - will discuss with Dr. Ladona Ridgel regarding restarting Eliquis - long term this is cost prohibitive for her. Discussed with Dr. Ladona Ridgel - he has advised that we will continue with her aspirin at this time until we have definitive documentation of recurrent arrhythmia.   3. HTN - no medicines yet today - would follow for now.   4. Anxiety and depression - just started SSRI therapy.   Current medicines are reviewed with the patient today.  The patient does not have concerns regarding medicines other than what has been noted above.  The following changes have been made:  See above.  Labs/ tests ordered today include:   No orders of the defined types were placed in this encounter.    Disposition:   FU with Dr. Graciela Husbands as planned next week.    Patient is agreeable to this plan and will call if any problems develop in the interim.   SignedNorma Fredrickson, NP  03/03/2017 8:27 AM  [facility] [address] [place], [place]   [address] Phone: [phone] Fax: [phone]      Addendum:  I have talked with Dr. Odessa Fleming nurse after Ms. Lai OV with me. She will try to obtain the monitor results. Dr. Graciela Husbands is working this morning in [place] and they will try to review these strips. If in fact she is having an arrhythmia - AFL or AF - will need to readdress the anticoagulation issue.   Rosalio Macadamia, RN, ANP-C [facility] [address] [place], [place]  [address] [phone]

## 2017-03-02 NOTE — Progress Notes (Signed)
Pre visit review using our clinic review tool, if applicable. No additional management support is needed unless otherwise documented below in the visit note. 

## 2017-03-02 NOTE — Assessment & Plan Note (Signed)
BP Readings from Last 3 Encounters:  03/02/17 (!) 178/88  03/01/17 (!) 130/92  02/27/17 134/82   Repeat 170/84 on right (with low pulse) May have true HTN and given her repeat tachyarrhytmias, will start diltiazem daily

## 2017-03-03 ENCOUNTER — Ambulatory Visit (INDEPENDENT_AMBULATORY_CARE_PROVIDER_SITE_OTHER): Payer: Medicare Other | Admitting: Nurse Practitioner

## 2017-03-03 ENCOUNTER — Telehealth: Payer: Self-pay | Admitting: Internal Medicine

## 2017-03-03 ENCOUNTER — Encounter: Payer: Self-pay | Admitting: Nurse Practitioner

## 2017-03-03 VITALS — BP 150/80 | HR 55 | Ht 63.0 in | Wt 150.0 lb

## 2017-03-03 DIAGNOSIS — R002 Palpitations: Secondary | ICD-10-CM | POA: Diagnosis not present

## 2017-03-03 DIAGNOSIS — R000 Tachycardia, unspecified: Secondary | ICD-10-CM | POA: Diagnosis not present

## 2017-03-03 NOTE — Progress Notes (Signed)
Please let her know that I reviewed her labs with Dr. Jens Somrenshaw. BNP is pretty high but kidney function has worsened. Will not be able to arrange cardiac cath yet -  Per Dr. Jens Somrenshaw,  We need to get her Lasix cut back to 40 mg a day.  She needs a Myoview - Lexiscan for CHF/chest pain/dyspnea Needs OV set up with Dr. Jens Somrenshaw per his request.

## 2017-03-03 NOTE — Patient Instructions (Addendum)
We will be checking the following labs today - NONE   Medication Instructions:    Continue with your current medicines.     Testing/Procedures To Be Arranged:  N/A  Follow-Up:   See Dr. Graciela HusbandsKlein as planned next week - Thursday, 03/12/17 @ 9:15 am    Other Special Instructions:   N/A    If you need a refill on your cardiac medications before your next appointment, please call your pharmacy.   Call the [facility] office at [phone] if you have any questions, problems or concerns.

## 2017-03-03 NOTE — Telephone Encounter (Signed)
Patient Name: Heidi Carroll  DOB: 1940-11-17    Initial Comment Caller was seen yesterday and given Rx, Diltiazem. She now has nausea.    Nurse Assessment  Nurse: Heidi Englisharmon, RN, Heidi Carroll Date/Time (Eastern Time): 03/03/2017 4:50:21 PM  Confirm and document reason for call. If symptomatic, describe symptoms. ---Caller was seen yesterday and had her dose of Diltiazem increased from 30mg  to 120mg . She now has nausea w/o vomiting. BP 141/62, HR 64 Allergies: PCN, Cymbalta, Welbutrin, Nicotene patches [facility] [address], [phone]  Does the patient have any new or worsening symptoms? ---Yes  Will a triage be completed? ---Yes  Related visit to physician within the last 2 weeks? ---Yes  Does the PT have any chronic conditions? (i.e. diabetes, asthma, etc.) ---Yes  List chronic conditions. ---R/O A-Fib, Ablation  Is this a behavioral health or substance abuse call? ---No     Guidelines    Guideline Title Affirmed Question Affirmed Notes  Nausea Taking prescription medication that could cause nausea (e.g., narcotics/opiates, antibiotics, OCPs, many others)    Final Disposition User   See Physician within 24 Hours Heidi Carroll    Comments  Advised will call in Zofran per SO, look at schedule for appt availability, and call her back. Verb understanding.  Pt may require med change due to nausea with use of increased dose of Diltiazem. Pt to see MD if SO med called to pharm, so upgraded to be eval within 24hrs. Advised to cont taking Diltiazem a/o by MD until further inst. Verb understanding.  Returned call to the pt and advised appt made with Heidi Carroll at [facility], 03/04/17. Pt is aware/agreeable and states she is familiar w/Heidi Carroll.   Referrals  REFERRED TO PCP OFFICE   Disagree/Comply: Comply

## 2017-03-04 ENCOUNTER — Ambulatory Visit: Payer: Self-pay | Admitting: Family Medicine

## 2017-03-04 ENCOUNTER — Telehealth: Payer: Self-pay | Admitting: Internal Medicine

## 2017-03-04 ENCOUNTER — Encounter: Payer: Self-pay | Admitting: Emergency Medicine

## 2017-03-04 ENCOUNTER — Emergency Department: Payer: Medicare Other

## 2017-03-04 ENCOUNTER — Emergency Department
Admission: EM | Admit: 2017-03-04 | Discharge: 2017-03-04 | Disposition: A | Discharge status: Home / Self Care | Payer: Medicare Other | Attending: Emergency Medicine | Admitting: Emergency Medicine

## 2017-03-04 DIAGNOSIS — R062 Wheezing: Secondary | ICD-10-CM | POA: Diagnosis not present

## 2017-03-04 DIAGNOSIS — Z79899 Other long term (current) drug therapy: Secondary | ICD-10-CM | POA: Insufficient documentation

## 2017-03-04 DIAGNOSIS — J449 Chronic obstructive pulmonary disease, unspecified: Secondary | ICD-10-CM | POA: Diagnosis not present

## 2017-03-04 DIAGNOSIS — I10 Essential (primary) hypertension: Secondary | ICD-10-CM | POA: Diagnosis not present

## 2017-03-04 DIAGNOSIS — R079 Chest pain, unspecified: Secondary | ICD-10-CM

## 2017-03-04 DIAGNOSIS — R0789 Other chest pain: Secondary | ICD-10-CM | POA: Diagnosis not present

## 2017-03-04 DIAGNOSIS — Z7982 Long term (current) use of aspirin: Secondary | ICD-10-CM | POA: Insufficient documentation

## 2017-03-04 DIAGNOSIS — F17210 Nicotine dependence, cigarettes, uncomplicated: Secondary | ICD-10-CM | POA: Diagnosis not present

## 2017-03-04 LAB — BASIC METABOLIC PANEL
ANION GAP: 11 (ref 5–15)
BUN: 11 mg/dL (ref 6–20)
CALCIUM: 8.9 mg/dL (ref 8.9–10.3)
CO2: 26 mmol/L (ref 22–32)
Chloride: 102 mmol/L (ref 101–111)
Creatinine, Ser: 0.64 mg/dL (ref 0.44–1.00)
GFR calc Af Amer: >60 mL/min (ref >60)
Glucose, Bld: 104 mg/dL — ABNORMAL HIGH (ref 65–99)
POTASSIUM: 3.7 mmol/L (ref 3.5–5.1)
SODIUM: 139 mmol/L (ref 135–145)

## 2017-03-04 LAB — CBC
HEMATOCRIT: 47.4 % — AB (ref 35.0–47.0)
HEMOGLOBIN: 16.2 g/dL — AB (ref 12.0–16.0)
MCH: 33.1 pg (ref 26.0–34.0)
MCHC: 34.2 g/dL (ref 32.0–36.0)
MCV: 96.7 fL (ref 80.0–100.0)
Platelets: 202 10*3/uL (ref 150–440)
RBC: 4.9 MIL/uL (ref 3.80–5.20)
RDW: 13.4 % (ref 11.5–14.5)
WBC: 7 10*3/uL (ref 3.6–11.0)

## 2017-03-04 LAB — TROPONIN I: Troponin I: <0.03 ng/mL (ref <0.03)

## 2017-03-04 LAB — BRAIN NATRIURETIC PEPTIDE: B NATRIURETIC PEPTIDE 5: 120 pg/mL — AB (ref 0.0–100.0)

## 2017-03-04 MED ORDER — LOSARTAN POTASSIUM 50 MG PO TABS
50.0000 mg | ORAL_TABLET | Freq: Once | ORAL | Status: AC
  Administered 2017-03-04: 50 mg via ORAL
  Filled 2017-03-04: qty 1

## 2017-03-04 MED ORDER — SODIUM CHLORIDE 0.9 % IV BOLUS (SEPSIS)
500.0000 mL | Freq: Once | INTRAVENOUS | Status: AC
  Administered 2017-03-04: 500 mL via INTRAVENOUS

## 2017-03-04 MED ORDER — NITROGLYCERIN 0.4 MG SL SUBL
0.4000 mg | SUBLINGUAL_TABLET | SUBLINGUAL | Status: DC | PRN
  Administered 2017-03-04: 0.4 mg via SUBLINGUAL
  Filled 2017-03-04: qty 1

## 2017-03-04 MED ORDER — ASPIRIN 81 MG PO CHEW
324.0000 mg | CHEWABLE_TABLET | Freq: Once | ORAL | Status: AC
  Administered 2017-03-04: 324 mg via ORAL
  Filled 2017-03-04: qty 4

## 2017-03-04 MED ORDER — ONDANSETRON HCL 4 MG/2ML IJ SOLN
4.0000 mg | Freq: Once | INTRAMUSCULAR | Status: AC
  Administered 2017-03-04: 4 mg via INTRAVENOUS
  Filled 2017-03-04: qty 2

## 2017-03-04 MED ORDER — LOSARTAN POTASSIUM 50 MG PO TABS: 50.0000 mg | ORAL_TABLET | Freq: Every day | ORAL | 0 refills | Status: DC

## 2017-03-04 NOTE — ED Provider Notes (Signed)
[facility] Emergency Department Provider Note  ____________________________________________  Time seen: Approximately 10:36 AM  I have reviewed the triage vital signs and the nursing notes.   HISTORY  Chief Complaint Hypertension   HPI Heidi Carroll is a 76 y.o. female with a history of hypertension, atrial fibrillation, depression who presents for evaluation of elevated blood pressure and chest discomfort. Patient reports this morning she was taking her blood pressure when she noted it was elevated at systolics of 180. She waited 30 minutes and retracted and her blood pressure was 200/89. She reports that she felt 7 out of 10 central chest tightness that has been constant since earlier this morning. Never had similar discomfort in the past. Currently is a 2 out of 10. Patient has been nauseated for 3 days. No vomiting, no diaphoresis, no shortness of breath, no lightheadedness. Patient underwent an ablation for atrial fibrillation in the beginning of April. 2 days ago she had her diltiazem switched from 30 mg as needed to extend release 120 mg daily to help with HR and BP. She reports nausea and decreased PO intake since being started on this standing dosage. She has not had palpitations. She continues to smoke and has f/h of ischemic heart disease.   Past Medical History:  Diagnosis Date  . A-fib (HCC)   . Depression   . Neuropathy    idiopathic    Patient Active Problem List   Diagnosis Date Noted  . Atrial fibrillation (HCC) 03/02/2017  . MDD (major depressive disorder), single episode, moderate (HCC) 02/27/2017  . Typical atrial flutter (HCC) 02/04/2017  . COPD (chronic obstructive pulmonary disease) (HCC) 11/18/2016  . Mood disorder (HCC) 11/18/2016  . Elevated blood pressure, situational 11/18/2016  . Nicotine dependence 12/31/2015  . Idiopathic peripheral neuropathy 04/02/2007    Past Surgical History:  Procedure Laterality Date  .  A-FLUTTER ABLATION N/A 02/04/2017   Procedure: A-Flutter Ablation;  Surgeon: Duke Salvia, MD;  Location: [facility] INVASIVE CV LAB;  Service: Cardiovascular;  Laterality: N/A;  . FOOT SURGERY  1995   bilateral   . SPINAL CORD STIMULATOR IMPLANT  12/1996   didn't keep  . VAGINAL DELIVERY  1963   X 1  . VAGINAL HYSTERECTOMY  1974    Prior to Admission medications   Medication Sig Start Date End Date Taking? Authorizing Provider  acetaminophen (TYLENOL) 500 MG tablet Take 500 mg by mouth daily as needed for moderate pain or headache.    [provider]  ALPRAZolam (XANAX) 0.25 MG tablet TAKE 1/2 TO 1 (ONE-HALF TO ONE) TABLET BY MOUTH TWICE DAILY AS NEEDED FOR  PANIC  SYMPTOMS 02/26/17   Tillman Abide I, MD  aspirin EC 81 MG tablet Take 81 mg by mouth daily.    [provider]  Calcium Carbonate-Vitamin D (CALCIUM 600+D PO) Take 1 tablet by mouth at bedtime.    [provider]  diltiazem (CARDIZEM) 30 MG tablet Take 1 tablet by mouth daily as needed. If heart rate is over 130 01/14/17   [provider]  diltiazem (DILACOR XR) 120 MG 24 hr capsule Take 1 capsule (120 mg total) by mouth daily. 03/02/17   Karie Schwalbe, MD  gabapentin (NEURONTIN) 600 MG tablet TAKE ONE TABLET BY MOUTH AT BEDTIME 02/16/17   Karie Schwalbe, MD  losartan (COZAAR) 50 MG tablet Take 1 tablet (50 mg total) by mouth daily. 03/04/17 04/03/17  Nita Sickle, MD  sertraline (ZOLOFT) 50 MG tablet Take 1  tablet (50 mg total) by mouth daily. 02/27/17   Karie SchwalbeLetvak, Richard I, MD    Allergies Cymbalta [duloxetine hcl]; Wellbutrin [bupropion]; Nicotine; and Penicillins  Family History  Problem Relation Age of Onset  . Cancer Father     colon  . Heart attack Brother   . Diabetes Maternal Grandfather     Social History Social History  Substance Use Topics  . Smoking status: Current Some Day Smoker    Packs/day: 0.25    Types: Cigarettes  . Smokeless tobacco: Never Used     Comment: 2-3 a  day  . Alcohol use No    Review of Systems  Constitutional: Negative for fever. Eyes: Negative for visual changes. ENT: Negative for sore throat. Neck: No neck pain  Cardiovascular: + chest tightness Respiratory: Negative for shortness of breath. Gastrointestinal: Negative for abdominal pain, vomiting or diarrhea. Genitourinary: Negative for dysuria. Musculoskeletal: Negative for back pain. Skin: Negative for rash. Neurological: Negative for headaches, weakness or numbness. Psych: No SI or HI  ____________________________________________   PHYSICAL EXAM:  VITAL SIGNS: ED Triage Vitals  Enc Vitals Group     BP 03/04/17 0936 (!) 168/61     Pulse Rate 03/04/17 0936 (!) 58     Resp 03/04/17 0936 20     Temp 03/04/17 0936 98.1 F (36.7 C)     Temp Source 03/04/17 0936 Oral     SpO2 03/04/17 0936 95 %     Weight 03/04/17 0929 150 lb (68 kg)     Height 03/04/17 0929 5\' 3"  (1.6 m)     Head Circumference --      Peak Flow --      Pain Score --      Pain Loc --      Pain Edu? --      Excl. in GC? --     Constitutional: Alert and oriented. Well appearing and in no apparent distress. HEENT:      Head: Normocephalic and atraumatic.         Eyes: Conjunctivae are normal. Sclera is non-icteric. EOMI. PERRL      Mouth/Throat: Mucous membranes are moist.       Neck: Supple with no signs of meningismus. Cardiovascular: Regular rate and rhythm. No murmurs, gallops, or rubs. 2+ symmetrical distal pulses are present in all extremities. No JVD. Respiratory: Normal respiratory effort. Lungs are clear to auscultation bilaterally. No wheezes, crackles, or rhonchi.  Gastrointestinal: Soft, non tender, and non distended with positive bowel sounds. No rebound or guarding. Genitourinary: No CVA tenderness. Musculoskeletal: Nontender with normal range of motion in all extremities. No edema, cyanosis, or erythema of extremities. Neurologic: Normal speech and language. Face is symmetric.  Moving all extremities. No gross focal neurologic deficits are appreciated. Skin: Skin is warm, dry and intact. No rash noted. Psychiatric: Mood and affect are normal. Speech and behavior are normal.  ____________________________________________   LABS (all labs ordered are listed, but only abnormal results are displayed)  Labs Reviewed  BASIC METABOLIC PANEL - Abnormal; Notable for the following:       Result Value   Glucose, Bld 104 (*)    All other components within normal limits  CBC - Abnormal; Notable for the following:    Hemoglobin 16.2 (*)    HCT 47.4 (*)    All other components within normal limits  BRAIN NATRIURETIC PEPTIDE - Abnormal; Notable for the following:    B Natriuretic Peptide 120.0 (*)    All other components within  normal limits  TROPONIN I  TROPONIN I   ____________________________________________  EKG  ED ECG REPORT I, Nita Sickle, the attending physician, personally viewed and interpreted this ECG.  Sinus bradycardia, rate of 59, normal intervals, normal axis, no ST elevations or depressions, T-wave inversion in aVL. Unchanged from prior ____________________________________________  RADIOLOGY  CXR:  Negative ____________________________________________   PROCEDURES  Procedure(s) performed: None Procedures Critical Care performed:  None ____________________________________________   INITIAL IMPRESSION / ASSESSMENT AND PLAN / ED COURSE   76 y.o. female with a history of hypertension, atrial fibrillation, depression who presents for evaluation of elevated blood pressure and chest discomfort. Patient is well-appearing, in no distress, she is hypertensive with systolics in the 150s. Still complaining of mild chest tightness. Her EKG shows no ischemic changes. Her first troponin is negative. Chest x-ray with no acute findings. Will give nitroglycerin to lower her blood pressure and for the chest discomfort. Will discuss with Dr.  Mariah Milling.  Clinical Course as of Mar 04 1320  Wed Mar 04, 2017  1056 Spoke with Dr. Mariah Milling who recommended starting patient on losartan 50mg  daily and close f/u in the beginning of next week. Will give first dose here.   [CV]    Clinical Course User Index [CV] Don Perking [place], MD    ----------------------------------------- 10:16 AM on 03/04/2017 -----------------------------------------   OBSERVATION CARE: This patient is being placed under observation care for the following reasons: Chest pain with repeat testing to rule out ischemia   _________________________ 12:20 PM on 03/04/2017 ----------------------------------------- Patient remains CP free now that BP is well controlled. Good response to losartan.  ----------------------------------------- 1:20 PM on 03/04/2017 -----------------------------------------   END OF OBSERVATION STATUS: After an appropriate period of observation, this patient is being discharged due to the following reason(s):  BP improved, CP resolved, troponin x 2 negative. Patient to be discharged on losartan per Dr. Mariah Milling recommendation and follow-up with PCP or cardiologist by this Friday.    Pertinent labs & imaging results that were available during my care of the patient were reviewed by me and considered in my medical decision making (see chart for details).    ____________________________________________   FINAL CLINICAL IMPRESSION(S) / ED DIAGNOSES  Final diagnoses:  Hypertension, unspecified type  Chest pain, unspecified type      NEW MEDICATIONS STARTED DURING THIS VISIT:  New Prescriptions   LOSARTAN (COZAAR) 50 MG TABLET    Take 1 tablet (50 mg total) by mouth daily.     Note:  This document was prepared using Dragon voice recognition software and may include unintentional dictation errors.    Don Perking, [place], MD 03/04/17 [phone]

## 2017-03-04 NOTE — Telephone Encounter (Signed)
[facility] - Client TELEPHONE ADVICE RECORD [facility] Patient Name: Heidi FillersSALLYE Verge DOB: 1941-10-12 Initial Comment Caller states that she is having rapid heart beat and her blood pressure is was 200/96. The reading was this morning and went to the ER and she just took it again and it is 177/75. The rapid heart beat is under control now. Nurse Assessment Nurse: Merlene PullingHammonds, RN, Lissa Date/Time Lamount Cohen(Eastern Time): 03/04/2017 4:02:28 PM Confirm and document reason for call. If symptomatic, describe symptoms. ---Caller states that she is having rapid heart beat and her blood pressure is was 200/96. They gave me some BP medication in IV and gave me some fluids. The reading was this morning and went to the ER and she just took it again and it is 177/75. The rapid heart beat is under control now. I feel weak and flushed. I take diltiazem 120mg  per day. I have a new prescription for Cozar 50 mg once daily but I haven't gotten it filled yet. Caller is taking BP now in right arm~ 173/74Does the patient have any new or worsening symptoms? ---Yes Will a triage be completed? ---Yes Related visit to physician within the last 2 weeks? ---Yes Does the PT have any chronic conditions? (i.e. diabetes, asthma, etc.) ---Yes List chronic conditions. ---HTN Is this a behavioral health or substance abuse call? ---No Guidelines Guideline Title Affirmed Question Affirmed Notes High Blood Pressure Systolic BP >= 160 OR Diastolic >= 100 Final Disposition User See PCP When Office is Open (within 3 days) Hammonds, RN, Lissa Comments Kimetha took BP at end of call ~ 143/75 ReferralsREFERRED TO PCP OFFICEDisagree/Comply: Comply Call Id: [phone]

## 2017-03-04 NOTE — Telephone Encounter (Signed)
Pt has appt with Dr Dayton MartesAron 03/04/17 at 11AM.

## 2017-03-04 NOTE — ED Triage Notes (Signed)
States has not been feeling well for "a while". Seen through ED on Sunday for afib RVR.  On Monday Cardizem dose was changed to Diltiazem 120 mg daily and 30 mg daily prn for HR > 130.  Patient states this morning her blood pressure was elevated at home, 200/89 and also c/o chest tightness.

## 2017-03-04 NOTE — Discharge Instructions (Signed)
Take losartan 50 mg daily starting tomorrow. Keep a diary of your blood pressure. Follow up with your doctor or cardiologist by Friday this week. Return to the emergency room if you have chest pain, she feels like her to pass out, or if you have any strokelike symptoms including facial droop, slurred speech, difficulty finding words, weakness or numbness of the extremities.

## 2017-03-04 NOTE — Telephone Encounter (Signed)
Pt has appt with Dr Dayton MartesAron on 03/05/17 at 10;00AM.

## 2017-03-05 ENCOUNTER — Ambulatory Visit (INDEPENDENT_AMBULATORY_CARE_PROVIDER_SITE_OTHER): Payer: Medicare Other | Admitting: Family Medicine

## 2017-03-05 VITALS — BP 146/74 | HR 58 | Temp 97.8°F | Wt 150.0 lb

## 2017-03-05 DIAGNOSIS — F418 Other specified anxiety disorders: Secondary | ICD-10-CM | POA: Diagnosis not present

## 2017-03-05 DIAGNOSIS — I483 Typical atrial flutter: Secondary | ICD-10-CM | POA: Diagnosis not present

## 2017-03-05 DIAGNOSIS — I484 Atypical atrial flutter: Secondary | ICD-10-CM | POA: Diagnosis not present

## 2017-03-05 DIAGNOSIS — I480 Paroxysmal atrial fibrillation: Secondary | ICD-10-CM

## 2017-03-05 DIAGNOSIS — R030 Elevated blood-pressure reading, without diagnosis of hypertension: Secondary | ICD-10-CM | POA: Diagnosis not present

## 2017-03-05 NOTE — Patient Instructions (Signed)
Great to see you.  Please do pick up losartan from the pharmacy but DO NOT start taking it yet unless your blood pressure gets high again  (180s/110 like yesterday).  Please make an appointment to see Dr. Alphonsus SiasLetvak tomorrow.

## 2017-03-05 NOTE — Progress Notes (Signed)
Subjective:   Patient ID: Heidi Carroll, female    DOB: 1940-12-01, 76 y.o.   MRN: [id number]  Heidi Carroll is a pleasant 76 y.o. year old female pt of Dr. Alphonsus Sias, new to me, with h/o HTN, afib and anxiety, who presents to clinic today with ER Follow-up for Elevated BP (Pt went to ER yesterday for elevated BP. Has struggled recently with new onset AFib and elevated BP. Pt believes this was brought on after using the nicotine patch to quit smoking a few months back. ER put her on Losartan. She will pick it up today.) and Stress (Her recent health issues have caused her alot of stress.)  on 03/05/2017  HPI:  Was seen in the ED yesterday for elevated blood pressure and CP.  Notes reviewed.  She presented to the Ed after checking her BP at home and found her systolic to be 180.  She also had some chest pressure- 2/10 without associated vomiting, diaphoresis, or SOB.  Underwent ablation for a fib in April.  3 days ago, PCP changed her diltiazem from 30 mg as needed to 120 mg ER daily to help with elevated HR and BP.  She did notice some nausea when this was changed.  In ED, troponin neg x 2. CXR neg.  EKG unchanged from prior:  Sinus bradycardia, rate of 59, normal intervals, normal axis, no ST elevations or depressions, T-wave inversion in aVL. Unchanged from prior  Dr. Mariah Milling was consulted and advised she be discharged home on losartan  and diltiazem.  She has not yet picked up losartan from the pharmacy.  Advised to follow up with PCP or cardiology on Friday but she came in today because she was anxious about her blood pressure.   She feels she has been very anxious since in recent months due to her health issues. Was recently started on zoloft but she has been afraid to take it.  Dg Chest 2 View  Result Date: 03/04/2017 CLINICAL DATA:  Chest pain and hypertension. EXAM: CHEST  2 VIEW COMPARISON:  03/01/2017 FINDINGS: The cardiomediastinal silhouette is unchanged with mild  prominence of the central pulmonary arteries. The cardiac silhouette is normal in size. Aortic atherosclerosis is noted. The lungs remain hyperinflated. No airspace consolidation, edema, pleural effusion, or pneumothorax is identified. No acute osseous abnormality is seen. IMPRESSION: No active cardiopulmonary disease. Electronically Signed   By: Sebastian Ache M.D.   On: 03/04/2017 10:04   Dg Chest 2 View  Result Date: 03/01/2017 CLINICAL DATA:  Chest tightness and tachycardia EXAM: CHEST  2 VIEW COMPARISON:  02/23/2017 chest radiograph. FINDINGS: Stable cardiomediastinal silhouette with normal heart size and aortic atherosclerosis. No pneumothorax. No pleural effusion. Lungs appear clear, with no acute consolidative airspace disease and no pulmonary edema. IMPRESSION: No active cardiopulmonary disease.  Aortic atherosclerosis. Electronically Signed   By: Delbert Phenix M.D.   On: 03/01/2017 17:28   Dg Chest 2 View  Result Date: 02/23/2017 CLINICAL DATA:  Pt states she had a really bad weekend. States her heart has been racing all weekend, states yesterday it was 113, Saturday is was 122. States she had an ablation about 2 weeks ago. She states these episodes are constant, they oc.*comment was truncated* EXAM: CHEST  2 VIEW COMPARISON:  01/25/2017 FINDINGS: Generator device over LEFT chest wall. Effusion, infiltrate pneumothorax. Normal cardiac silhouette. Lungs are hyperinflated. IMPRESSION: No acute cardiopulmonary process. Electronically Signed   By: Genevive Bi M.D.   On: 02/23/2017 16:14  Current Outpatient Prescriptions on File Prior to Visit  Medication Sig Dispense Refill  . acetaminophen (TYLENOL) 500 MG tablet Take 500 mg by mouth daily as needed for moderate pain or headache.    . ALPRAZolam (XANAX) 0.25 MG tablet TAKE 1/2 TO 1 (ONE-HALF TO ONE) TABLET BY MOUTH TWICE DAILY AS NEEDED FOR  PANIC  SYMPTOMS 20 tablet 0  . aspirin EC 81 MG tablet Take 81 mg by mouth daily.    Marland Kitchen diltiazem  (CARDIZEM) 30 MG tablet Take 1 tablet by mouth daily as needed. If heart rate is over 130    . diltiazem (DILACOR XR) 120 MG 24 hr capsule Take 1 capsule (120 mg total) by mouth daily. 30 capsule 11  . gabapentin (NEURONTIN) 600 MG tablet TAKE ONE TABLET BY MOUTH AT BEDTIME 90 tablet 3  . losartan (COZAAR) 50 MG tablet Take 1 tablet (50 mg total) by mouth daily. 30 tablet 0  . sertraline (ZOLOFT) 50 MG tablet Take 1 tablet (50 mg total) by mouth daily. 30 tablet 1  . Calcium Carbonate-Vitamin D (CALCIUM 600+D PO) Take 1 tablet by mouth at bedtime.     No current facility-administered medications on file prior to visit.     Allergies  Allergen Reactions  . Cymbalta [Duloxetine Hcl] Nausea Only  . Wellbutrin [Bupropion] Other (See Comments)    Elevated blood pressure, anxiety  . Nicotine Palpitations    Nicotine Patches   . Penicillins Rash    Has patient had a PCN reaction causing immediate rash, facial/tongue/throat swelling, SOB or lightheadedness with hypotension yes Has patient had a PCN reaction causing severe rash involving mucus membranes or skin necrosis: no Has patient had a PCN reaction that required hospitalization no Has patient had a PCN reaction occurring within the last 10 years: no If all of the above answers are "NO", then may proceed with Cephalosporin use.    Past Medical History:  Diagnosis Date  . A-fib (HCC)   . Depression   . Neuropathy    idiopathic    Past Surgical History:  Procedure Laterality Date  . A-FLUTTER ABLATION N/A 02/04/2017   Procedure: A-Flutter Ablation;  Surgeon: Duke Salvia, MD;  Location: [facility] INVASIVE CV LAB;  Service: Cardiovascular;  Laterality: N/A;  . FOOT SURGERY  1995   bilateral   . SPINAL CORD STIMULATOR IMPLANT  12/1996   didn't keep  . VAGINAL DELIVERY  1963   X 1  . VAGINAL HYSTERECTOMY  1974    Family History  Problem Relation Age of Onset  . Cancer Father        colon  . Heart attack Brother   . Diabetes  Maternal Grandfather     Social History   Social History  . Marital status: Widowed    Spouse name: N/A  . Number of children: 1  . Years of education: N/A   Occupational History  . Retired, [employer])    Social History Main Topics  . Smoking status: Current Some Day Smoker    Packs/day: 0.25    Types: Cigarettes  . Smokeless tobacco: Never Used     Comment: 2-3 a day  . Alcohol use No  . Drug use: No  . Sexual activity: Not on file   Other Topics Concern  . Not on file   Social History Narrative   Husband died 2023/09/01      Has living will   Has health care POA--- Chaya Jan, friend   Requests  DNR--- form done 12/22/14   No tube feedings if cognitively unaware   The PMH, PSH, Social History, Family History, Medications, and allergies [facility], and have been updated if relevant.  Review of Systems  Constitutional: Negative.   HENT: Negative.   Eyes: Negative.   Respiratory: Negative.   Cardiovascular: Negative.   Endocrine: Negative.   Genitourinary: Negative.   Musculoskeletal: Negative.   Skin: Negative.   Neurological: Negative.   Hematological: Negative.   Psychiatric/Behavioral: The patient is nervous/anxious.   All other systems reviewed and are negative.      Objective:    BP (!) 146/76 (BP Location: Left Arm, Patient Position: Sitting, Cuff Size: Normal)   Pulse (!) 58   Temp 97.8 F (36.6 C) (Oral)   Wt 150 lb (68 kg)   SpO2 97%   BMI 26.57 kg/m    Physical Exam  Constitutional: She is oriented to person, place, and time. She appears well-developed and well-nourished. No distress.  HENT:  Head: Normocephalic and atraumatic.  Eyes: Conjunctivae are normal.  Cardiovascular: Normal rate, regular rhythm and normal heart sounds.   Pulmonary/Chest: Effort normal.  Neurological: She is alert and oriented to person, place, and time. No cranial nerve deficit.  Skin: Skin is warm and dry. She is not diaphoretic.    Psychiatric: Her behavior is normal. Judgment normal. Her mood appears anxious.  Nursing note and vitals reviewed.         Assessment & Plan:   Typical atrial flutter (HCC)  Paroxysmal atrial fibrillation (HCC)  Elevated blood pressure, situational  Situational anxiety No Follow-up on file.

## 2017-03-05 NOTE — Assessment & Plan Note (Addendum)
NSR today. On ASA 81 mg daily. Remains on Diltiazem.  I advised against starting cozaar yet given that her BP and HR are much lower today. Follow up with PCP tomorrow.

## 2017-03-05 NOTE — Assessment & Plan Note (Signed)
She wants to hold off on starting zoloft. Will discuss with PCP.

## 2017-03-05 NOTE — Progress Notes (Signed)
Pre visit review using our clinic review tool, if applicable. No additional management support is needed unless otherwise documented below in the visit note. 

## 2017-03-06 ENCOUNTER — Telehealth: Payer: Self-pay | Admitting: Internal Medicine

## 2017-03-06 ENCOUNTER — Ambulatory Visit (INDEPENDENT_AMBULATORY_CARE_PROVIDER_SITE_OTHER): Payer: Medicare Other | Admitting: Internal Medicine

## 2017-03-06 ENCOUNTER — Encounter: Payer: Self-pay | Admitting: Internal Medicine

## 2017-03-06 VITALS — BP 138/80 | HR 48 | Temp 97.9°F | Wt 151.0 lb

## 2017-03-06 DIAGNOSIS — I10 Essential (primary) hypertension: Secondary | ICD-10-CM | POA: Diagnosis not present

## 2017-03-06 DIAGNOSIS — I480 Paroxysmal atrial fibrillation: Secondary | ICD-10-CM | POA: Diagnosis not present

## 2017-03-06 MED ORDER — FLECAINIDE ACETATE 50 MG PO TABS: 50.0000 mg | ORAL_TABLET | Freq: Two times a day (BID) | ORAL | 6 refills | Status: DC

## 2017-03-06 NOTE — Assessment & Plan Note (Signed)
Very labile She will continue the diltiazem and only use the losartan if her BP goes up again

## 2017-03-06 NOTE — Progress Notes (Signed)
Subjective:    Patient ID: Heidi Carroll, female    DOB: 06-Jul-1941, 76 y.o.   MRN: [id number]  HPI Here for follow up of multiple ER visits and arrhythmia  Very complicated situation Event monitor didn't show anything major Did decide to start the flecainide --will fill the prescription  Is taking the diltiazem 120 Not taking the losartan Has had nausea and thought it might be related to the dilt (but this is better)  Anxiety is bad now with all that is going on Had to take alprazolam last night--for panic Still not able to sleep so tired now  BP 129/52 last night But had been over 200 prompting the last ER visit  Current Outpatient Prescriptions on File Prior to Visit  Medication Sig Dispense Refill  . acetaminophen (TYLENOL) 500 MG tablet Take 500 mg by mouth daily as needed for moderate pain or headache.    . ALPRAZolam (XANAX) 0.25 MG tablet TAKE 1/2 TO 1 (ONE-HALF TO ONE) TABLET BY MOUTH TWICE DAILY AS NEEDED FOR  PANIC  SYMPTOMS 20 tablet 0  . aspirin EC 81 MG tablet Take 81 mg by mouth daily.    . Calcium Carbonate-Vitamin D (CALCIUM 600+D PO) Take 1 tablet by mouth at bedtime.    Marland Kitchen diltiazem (CARDIZEM) 30 MG tablet Take 1 tablet by mouth daily as needed. If heart rate is over 130    . diltiazem (DILACOR XR) 120 MG 24 hr capsule Take 1 capsule (120 mg total) by mouth daily. 30 capsule 11  . gabapentin (NEURONTIN) 600 MG tablet TAKE ONE TABLET BY MOUTH AT BEDTIME 90 tablet 3  . losartan (COZAAR) 50 MG tablet Take 1 tablet (50 mg total) by mouth daily. 30 tablet 0  . sertraline (ZOLOFT) 50 MG tablet Take 1 tablet (50 mg total) by mouth daily. 30 tablet 1   No current facility-administered medications on file prior to visit.     Allergies  Allergen Reactions  . Cymbalta [Duloxetine Hcl] Nausea Only  . Wellbutrin [Bupropion] Other (See Comments)    Elevated blood pressure, anxiety  . Nicotine Palpitations    Nicotine Patches   . Penicillins Rash    Has patient  had a PCN reaction causing immediate rash, facial/tongue/throat swelling, SOB or lightheadedness with hypotension yes Has patient had a PCN reaction causing severe rash involving mucus membranes or skin necrosis: no Has patient had a PCN reaction that required hospitalization no Has patient had a PCN reaction occurring within the last 10 years: no If all of the above answers are "NO", then may proceed with Cephalosporin use.    Past Medical History:  Diagnosis Date  . A-fib (HCC)   . Depression   . Neuropathy    idiopathic    Past Surgical History:  Procedure Laterality Date  . A-FLUTTER ABLATION N/A 02/04/2017   Procedure: A-Flutter Ablation;  Surgeon: Duke Salvia, MD;  Location[facility] INVASIVE CV LAB;  Service: Cardiovascular;  Laterality: N/A;  . FOOT SURGERY  1995   bilateral   . SPINAL CORD STIMULATOR IMPLANT  12/1996   didn't keep  . VAGINAL DELIVERY  1963   X 1  . VAGINAL HYSTERECTOMY  1974    Family History  Problem Relation Age of Onset  . Cancer Father        colon  . Heart attack Brother   . Diabetes Maternal Grandfather     Social History   Social History  . Marital status: Widowed  Spouse name: N/A  . Number of children: 1  . Years of education: N/A   Occupational History  . Retired, [employer]([place]    Social History Main Topics  . Smoking status: Current Some Day Smoker    Packs/day: 0.25    Types: Cigarettes  . Smokeless tobacco: Never Used     Comment: 2-3 a day  . Alcohol use No  . Drug use: No  . Sexual activity: Not on file   Other Topics Concern  . Not on file   Social History Narrative   Husband died 10/14      Has living will   Has health care POA--- [name], friend   Requests DNR--- form done 12/22/14   No tube feedings if cognitively unaware   Review of Systems  Appetite is not good Weight is stable     Objective:   Physical Exam  Constitutional: She appears well-nourished. No distress.  Cardiovascular:  Regular rhythm and normal heart sounds.  Exam reveals no gallop.   No murmur heard. Slow but regular  Pulmonary/Chest: No respiratory distress. She has no wheezes. She has no rales.  Decreased breath sounds but clear  Musculoskeletal: She exhibits no edema.          Assessment & Plan:

## 2017-03-06 NOTE — Telephone Encounter (Signed)
Study Highlights   Event Recorder Fluttering and racing heart beats            Findings                       PVC                       Non sustained atrial tachycardia  180 bpm but only 4 beats                       There is some suggestion of atrial tach, not fast about 110                                   No concerning arrhythmias            How symptomatic is Heidi Carroll, knowing nothing of serious nature                         If Heidi Carroll desires some therapy in addition to her dilt would try low dose flec 50 bid with followup in 3 weeks with me Thanks      I spoke with the patient this morning regarding her monitor results.  Heidi Carroll is wanting to try flecainide 50 mg BID. Heidi Carroll already has follow up with Dr. Graciela HusbandsKlein on 03/12/17 and we will leave this as scheduled.  RX for flecainide sent to [facility] on [address] in [place].

## 2017-03-06 NOTE — Assessment & Plan Note (Signed)
Reported by EMTs when rate in 180's before an ER visit--but none on the monitor She is going to start the flecainide Will continue the diltiazem

## 2017-03-06 NOTE — Patient Instructions (Signed)
Please continue the diltiazem 120mg  daily--but only take the losartan if your blood pressure goes back up regularly.

## 2017-03-11 ENCOUNTER — Telehealth: Payer: Self-pay | Admitting: Internal Medicine

## 2017-03-11 ENCOUNTER — Emergency Department
Admission: EM | Admit: 2017-03-11 | Discharge: 2017-03-11 | Disposition: A | Discharge status: Home / Self Care | Payer: Medicare Other | Attending: Emergency Medicine | Admitting: Emergency Medicine

## 2017-03-11 ENCOUNTER — Encounter: Payer: Self-pay | Admitting: Emergency Medicine

## 2017-03-11 ENCOUNTER — Telehealth: Payer: Self-pay | Admitting: Physician Assistant

## 2017-03-11 ENCOUNTER — Emergency Department: Payer: Medicare Other

## 2017-03-11 DIAGNOSIS — I700 Atherosclerosis of aorta: Secondary | ICD-10-CM | POA: Diagnosis not present

## 2017-03-11 DIAGNOSIS — I499 Cardiac arrhythmia, unspecified: Secondary | ICD-10-CM | POA: Diagnosis not present

## 2017-03-11 DIAGNOSIS — Z7982 Long term (current) use of aspirin: Secondary | ICD-10-CM | POA: Insufficient documentation

## 2017-03-11 DIAGNOSIS — R0789 Other chest pain: Secondary | ICD-10-CM | POA: Diagnosis not present

## 2017-03-11 DIAGNOSIS — F17210 Nicotine dependence, cigarettes, uncomplicated: Secondary | ICD-10-CM | POA: Insufficient documentation

## 2017-03-11 DIAGNOSIS — Z79899 Other long term (current) drug therapy: Secondary | ICD-10-CM | POA: Insufficient documentation

## 2017-03-11 DIAGNOSIS — J449 Chronic obstructive pulmonary disease, unspecified: Secondary | ICD-10-CM | POA: Insufficient documentation

## 2017-03-11 DIAGNOSIS — I10 Essential (primary) hypertension: Secondary | ICD-10-CM | POA: Insufficient documentation

## 2017-03-11 DIAGNOSIS — F419 Anxiety disorder, unspecified: Secondary | ICD-10-CM | POA: Insufficient documentation

## 2017-03-11 LAB — CBC
HCT: 48.3 % — ABNORMAL HIGH (ref 35.0–47.0)
Hemoglobin: 16.6 g/dL — ABNORMAL HIGH (ref 12.0–16.0)
MCH: 33.8 pg (ref 26.0–34.0)
MCHC: 34.4 g/dL (ref 32.0–36.0)
MCV: 98.3 fL (ref 80.0–100.0)
PLATELETS: 191 10*3/uL (ref 150–440)
RBC: 4.91 MIL/uL (ref 3.80–5.20)
RDW: 13.2 % (ref 11.5–14.5)
WBC: 6.4 10*3/uL (ref 3.6–11.0)

## 2017-03-11 LAB — BASIC METABOLIC PANEL
Anion gap: 8 (ref 5–15)
BUN: 12 mg/dL (ref 6–20)
CHLORIDE: 105 mmol/L (ref 101–111)
CO2: 28 mmol/L (ref 22–32)
CREATININE: 0.75 mg/dL (ref 0.44–1.00)
Calcium: 9.6 mg/dL (ref 8.9–10.3)
GFR calc Af Amer: >60 mL/min (ref >60)
GFR calc non Af Amer: >60 mL/min (ref >60)
Glucose, Bld: 103 mg/dL — ABNORMAL HIGH (ref 65–99)
Potassium: 4.1 mmol/L (ref 3.5–5.1)
SODIUM: 141 mmol/L (ref 135–145)

## 2017-03-11 LAB — TROPONIN I: Troponin I: <0.03 ng/mL (ref <0.03)

## 2017-03-11 NOTE — ED Provider Notes (Signed)
[facility] Emergency Department Provider Note ____________________________________________   I have reviewed the triage vital signs and the triage nursing note.  HISTORY  Chief Complaint Hypertension   Historian Patient  HPI Heidi Carroll is a 76 y.o. female here with her good neighbor for elevated BPs this morning.  States last evening she felt warmth along the left neck after cooking and took her BP and it was elevated in the 180s/80s range.  She has been quite anxious about this.  She state that since January when she started nicotine patches to quit smoking she started having afib rvr and bp problems.  In April she had cardiac ablation.  Over the past 2 weeks she's had at 2 ED visits, multiple visits to Pcp Dr. Alphonsus Sias and her cardiologist Dr. Clide Cliff.  States this morning she felt flushed and took her BP and was again 170-180/70-80s.  She took a half dose of losartan which was prescribed when necessary in the ER, and after 30 minutes took the second half of the losartan tablet.  She became quite anxious and has come in for further evaluation. No real chest pain or chest pressure.  After talking to her quite a bit, she states that she has had panic attacks in the past and previously took Xanax and has been taking Zoloft as well. She states that she lost her son several years ago, and did not really grieve that loss much because she was taking care of her husband who had a stroke and has subsequently died.      Past Medical History:  Diagnosis Date  . A-fib (HCC)   . Depression   . Neuropathy    idiopathic    Patient Active Problem List   Diagnosis Date Noted  . Situational anxiety 03/05/2017  . Atrial fibrillation (HCC) 03/02/2017  . MDD (major depressive disorder), single episode, moderate (HCC) 02/27/2017  . Typical atrial flutter (HCC) 02/04/2017  . COPD (chronic obstructive pulmonary disease) (HCC) 11/18/2016  . Mood disorder (HCC) 11/18/2016   . Essential hypertension, benign 11/18/2016  . Nicotine dependence 12/31/2015  . Idiopathic peripheral neuropathy 04/02/2007    Past Surgical History:  Procedure Laterality Date  . A-FLUTTER ABLATION N/A 02/04/2017   Procedure: A-Flutter Ablation;  Surgeon: Duke Salvia, MD;  Location: [facility] INVASIVE CV LAB;  Service: Cardiovascular;  Laterality: N/A;  . FOOT SURGERY  1995   bilateral   . SPINAL CORD STIMULATOR IMPLANT  12/1996   didn't keep  . VAGINAL DELIVERY  1963   X 1  . VAGINAL HYSTERECTOMY  1974    Prior to Admission medications   Medication Sig Start Date End Date Taking? Authorizing Provider  acetaminophen (TYLENOL) 500 MG tablet Take 500 mg by mouth daily as needed for moderate pain or headache.   Yes [provider]  ALPRAZolam (XANAX) 0.25 MG tablet TAKE 1/2 TO 1 (ONE-HALF TO ONE) TABLET BY MOUTH TWICE DAILY AS NEEDED FOR  PANIC  SYMPTOMS 02/26/17  Yes Karie Schwalbe, MD  aspirin EC 81 MG tablet Take 81 mg by mouth daily.   Yes [provider]  Calcium Carbonate-Vitamin D (CALCIUM 600+D PO) Take 1 tablet by mouth at bedtime.   Yes [provider]  diltiazem (CARDIZEM) 30 MG tablet Take 1 tablet by mouth daily as needed. If heart rate is over 130 01/14/17  Yes [provider]  diltiazem (DILACOR XR) 120 MG 24 hr capsule Take 1 capsule (120 mg total) by mouth daily. 03/02/17  Yes Karie SchwalbeLetvak, Richard I, MD  flecainide (TAMBOCOR) 50 MG tablet Take 1 tablet (50 mg total) by mouth 2 (two) times daily. 03/06/17  Yes Duke SalviaKlein, Steven C, MD  gabapentin (NEURONTIN) 600 MG tablet TAKE ONE TABLET BY MOUTH AT BEDTIME 02/16/17  Yes Karie SchwalbeLetvak, Richard I, MD  losartan (COZAAR) 50 MG tablet Take 1 tablet (50 mg total) by mouth daily. 03/04/17 04/03/17 Yes Veronese, [place], MD  sertraline (ZOLOFT) 50 MG tablet Take 1 tablet (50 mg total) by mouth daily. 02/27/17  Yes Karie SchwalbeLetvak, Richard I, MD    Allergies  Allergen Reactions  . Cymbalta [Duloxetine Hcl] Nausea Only  .  Wellbutrin [Bupropion] Other (See Comments)    Elevated blood pressure, anxiety  . Nicotine Palpitations    Nicotine Patches   . Penicillins Rash    Has patient had a PCN reaction causing immediate rash, facial/tongue/throat swelling, SOB or lightheadedness with hypotension yes Has patient had a PCN reaction causing severe rash involving mucus membranes or skin necrosis: no Has patient had a PCN reaction that required hospitalization no Has patient had a PCN reaction occurring within the last 10 years: no If all of the above answers are "NO", then may proceed with Cephalosporin use.    Family History  Problem Relation Age of Onset  . Cancer Father        colon  . Heart attack Brother   . Diabetes Maternal Grandfather     Social History Social History  Substance Use Topics  . Smoking status: Current Some Day Smoker    Packs/day: 0.25    Types: Cigarettes  . Smokeless tobacco: Never Used     Comment: 2-3 a day  . Alcohol use No    Review of Systems  Constitutional: Negative for fever. Eyes: Negative for visual changes. ENT: Negative for sore throat. Cardiovascular: Negative for chest pain. Respiratory: Negative for shortness of breath. Gastrointestinal: Negative for abdominal pain, vomiting and diarrhea. Genitourinary: Negative for dysuria. Musculoskeletal: Negative for back pain. Skin: Negative for rash. Neurological: Negative for headache.  ____________________________________________   PHYSICAL EXAM:  VITAL SIGNS: ED Triage Vitals  Enc Vitals Group     BP 03/11/17 1019 (!) 168/81     Pulse Rate 03/11/17 1019 72     Resp 03/11/17 1019 20     Temp 03/11/17 1019 97.9 F (36.6 C)     Temp Source 03/11/17 1019 Oral     SpO2 03/11/17 1019 90 %     Weight 03/11/17 1020 149 lb (67.6 kg)     Height 03/11/17 1020 5\' 3"  (1.6 m)     Head Circumference --      Peak Flow --      Pain Score 03/11/17 1019 5     Pain Loc --      Pain Edu? --      Excl. in GC? --       Constitutional: Alert and oriented. Quite anxious. Well appearing and in no distress. HEENT   Head: Normocephalic and atraumatic.      Eyes: Conjunctivae are normal. PER.      Ears:         Nose: No congestion/rhinnorhea.   Mouth/Throat: Mucous membranes are moist.   Neck: No stridor. Cardiovascular/Chest: Slightly bradycardic, irregularly irregular.  No murmurs, rubs, or gallops. Respiratory: Normal respiratory effort without tachypnea nor retractions. Breath sounds are clear and equal bilaterally. No wheezes/rales/rhonchi. Gastrointestinal: Soft. No distention, no guarding, no rebound. Nontender.    Genitourinary/rectal:Deferred Musculoskeletal: Nontender with normal  range of motion in all extremities. No joint effusions.  No lower extremity tenderness.  No edema. Neurologic:  Normal speech and language. No gross or focal neurologic deficits are appreciated. Skin:  Skin is warm, dry and intact. No rash noted. Psychiatric: Quite anxious, nearly tearful while we were talking about her losses. No psychosis. No depressed mood. Speech and behavior are normal. Patient exhibits appropriate insight and judgment.   ____________________________________________  LABS (pertinent positives/negatives)  Labs Reviewed  BASIC METABOLIC PANEL - Abnormal; Notable for the following:       Result Value   Glucose, Bld 103 (*)    All other components within normal limits  CBC - Abnormal; Notable for the following:    Hemoglobin 16.6 (*)    HCT 48.3 (*)    All other components within normal limits  TROPONIN I    ____________________________________________    EKG I, Governor Rooks, MD, the attending physician have personally viewed and interpreted all ECGs.  57 bpm atrial fibrillation with slow ventricular response. Nonspecific intraventricular conduction delay. Normal axis. Nonspecific T-wave ____________________________________________  RADIOLOGY All Xrays were viewed by  me. Imaging interpreted by Radiologist.  Chest x-ray:  IMPRESSION: 1. Hyperinflation without acute finding. 2.  Aortic atherosclerosis (ICD10-170.0). __________________________________________  PROCEDURES  Procedure(s) performed: None  Critical Care performed: None  ____________________________________________   ED COURSE / ASSESSMENT AND PLAN  Pertinent labs & imaging results that were available during my care of the patient were reviewed by me and considered in my medical decision making (see chart for details).    Ms. Scaduto has been having elevated blood pressure since last night occasionally, last night had some warmth or fullness on the left side of her neck, without any other associated symptoms for ACS.  EKG is reassuring. She has close follow-up with her primary care doctor and her cardiologist, Dr. Graciela Husbands which she has an appointment tomorrow.  She has no ongoing elevated blood pressure or chest discomfort or weakness or other ACS symptoms. She is highly anxious, and we discussed quite a bit her anxiety and fear related to the heart condition that has shown up since January, in addition she feels like she has agreed for 2 major losses. I have recommended that she discuss trying that get in touch with grief counseling, possibly through hospice.    CONSULTATIONS: None   Patient / Family / Caregiver informed of clinical course, medical decision-making process, and agree with plan.   I discussed return precautions, follow-up instructions, and discharge instructions with patient and/or family.  Discharge instructions:  You were evaluated for elevated blood pressures, and after discussed her treatment and evaluation are reassuring in the emergency department today.  Return to emergency department immediately for any chest pain, sweats, nausea, vomiting, breathing, weakness, numbness, blood pressure higher than 230/120, or any other symptoms concerning to  you.  ___________________________________________   FINAL CLINICAL IMPRESSION(S) / ED DIAGNOSES   Final diagnoses:  Aortic atherosclerosis (HCC)  Hypertension, unspecified type  Anxiety              Note: This dictation was prepared with Dragon dictation. Any transcriptional errors that result from this process are unintentional    Governor Rooks, MD 03/11/17 1145

## 2017-03-11 NOTE — Telephone Encounter (Signed)
The patient called the answering service after-hours today reporting increased blood pressure. She is traditionally on diltiazem 120mg  daily long-acting - has h/o atrial flutter s/p ablation. She does have episodic HR in the upper 40s-50s now but is asymptomatic with this. Her PCP has given her PRN losartan to take.  This AM when she woke up her blood pressure was 175/80. She took 50mg  of losartan (which she has been doing the last 4 days). After she took this, it went up to 188/90 so she went to ER. Per ED notes, BP 168/81. Patient was also reporting significant anxiety with recent family losses. In general she has been noticing her BP running higher overall, over 170 systolic at times, even with the extra losartan on board.  She called because blood pressure is now 195/90, HR 93. She feels somewhat flushed/nervous like she does when her BP is high but nothing out of the ordinary. No palpitations, chest pain, or dyspnea. I advised she go ahead and take another 50mg  of losartan now, to recheck her blood pressure in 2 hours, and call back if BP remains elevated or if any new symptoms. Suspect based on recent reports she may require standing losartan at 100mg  daily but she has f/u in AM and this can be discussed at that time.  Of note I reviewed her ED EKG from earlier which seems to show one sinus beat and the rest of the tracing appears to be junctional escape rhythm with few retrograde conducted P waves - VA dissociation. Reviewed with Dr. Herbie BaltimoreHarding - since patient's HR is currently stable at 3893 and she was not having any symptoms related to this rhythm earlier, no need for urgent re-evaluation but important to keep f/u in AM for Dr. Graciela HusbandsKlein to review this EKG. He does not think this should interfere with her ability to continue diltiazem since HR was not slow. The patient verbalized understanding and gratitude.  Will forward to triage to make sure they bring this EKG to Klein's attention in AM.  Ronie Spiesayna Jacquline Terrill  PA-C

## 2017-03-11 NOTE — Telephone Encounter (Signed)
Per chart review tab pt went to [facility] ED. 

## 2017-03-11 NOTE — ED Notes (Signed)
Pt alert and oriented X4, active, cooperative, pt in NAD. RR even and unlabored, color WNL.  Pt informed to return if any life threatening symptoms occur.   

## 2017-03-11 NOTE — ED Triage Notes (Signed)
Patient from home via [facility]. Patient reports systolic BP of 180 at home this morning. Patient also complaining of headache and pain radiating from neck to left shoulder. Patient states "I just don't feel well." Patient also reports intermittent tightness in chest. Denies increased SOB. Alert and oriented x4.

## 2017-03-11 NOTE — Telephone Encounter (Signed)
Please check on her tomorrow 

## 2017-03-11 NOTE — Telephone Encounter (Signed)
[facility] Client TELEPHONE ADVICE RECORD [facility] Patient Name: Heidi Carroll DOB: 10/13/1941 Initial Comment Caller sally says her BP keeps going up she is taking losartan but it still keeps going up it is at 188/84. Nurse Assessment Nurse: Reed Pandyamsey, RN, Amy Date/Time Lamount Cohen(Eastern Time): 03/11/2017 9:18:36 AM Confirm and document reason for call. If symptomatic, describe symptoms. ---Patient states her BP is 179/80 currently but keeps going up since yesterday afternoon. Is taking Losartan that the ER doctor gave her last Sunday. BP was 200/104 when she went to ER. Says she felt some chest tightness this morning but it went away and currently not feeling any pain or tightness in the chest. Does the patient have any new or worsening symptoms? ---Yes Will a triage be completed? ---Yes Related visit to physician within the last 2 weeks? ---No Does the PT have any chronic conditions? (i.e. diabetes, asthma, etc.) ---Yes List chronic conditions. ---HTN Is this a behavioral health or substance abuse call? ---No Guidelines Guideline Title Affirmed Question Affirmed Notes High Blood Pressure [1] Systolic BP >= 160 OR Diastolic >= 100 AND [2] cardiac or neurologic symptoms (e.g., chest pain, difficulty breathing, unsteady gait, blurred vision) Final Disposition User Go to ED Now Reed Pandyamsey, RN, Amy Referrals [facility] - ED Disagree/Comply: Comply

## 2017-03-11 NOTE — Discharge Instructions (Signed)
You were evaluated for elevated blood pressures, and after discussed her treatment and evaluation are reassuring in the emergency department today.  Return to emergency department immediately for any chest pain, sweats, nausea, vomiting, breathing, weakness, numbness, blood pressure higher than 230/120, or any other symptoms concerning to you.

## 2017-03-12 ENCOUNTER — Encounter: Payer: Self-pay | Admitting: Internal Medicine

## 2017-03-12 ENCOUNTER — Ambulatory Visit: Payer: Medicare Other | Admitting: Internal Medicine

## 2017-03-12 ENCOUNTER — Ambulatory Visit (INDEPENDENT_AMBULATORY_CARE_PROVIDER_SITE_OTHER): Payer: Medicare Other | Admitting: Internal Medicine

## 2017-03-12 VITALS — BP 160/80 | HR 59 | Ht 63.0 in | Wt 147.5 lb

## 2017-03-12 DIAGNOSIS — I483 Typical atrial flutter: Secondary | ICD-10-CM

## 2017-03-12 DIAGNOSIS — I471 Supraventricular tachycardia: Secondary | ICD-10-CM

## 2017-03-12 DIAGNOSIS — I10 Essential (primary) hypertension: Secondary | ICD-10-CM

## 2017-03-12 MED ORDER — DILTIAZEM HCL ER 120 MG PO CP24: 120.0000 mg | ORAL_CAPSULE | Freq: Two times a day (BID) | ORAL | 3 refills | Status: DC

## 2017-03-12 MED ORDER — LOSARTAN POTASSIUM 50 MG PO TABS: 50.0000 mg | ORAL_TABLET | Freq: Two times a day (BID) | ORAL | 3 refills | Status: DC

## 2017-03-12 MED ORDER — HYDRALAZINE HCL 25 MG PO TABS: ORAL_TABLET | ORAL | 3 refills | Status: DC

## 2017-03-12 NOTE — Telephone Encounter (Signed)
Spoke to pt. She said her BP this morning is 190/93. She is at Dr Odessa FlemingKlein's office right now.

## 2017-03-12 NOTE — Patient Instructions (Addendum)
Medication Instructions: - Your physician has recommended you make the following change in your medication:  1) Increase diltiazem 120 mg- take 1 capsule by mouth twice daily 2) Increase losartan 50 mg- take 1 tablet by mouth twice daily 3) Start hydralazine 25 mg- take 1 tablet by mouth every 6 hours as needed for a blood pressure > 170 on the top number  Labwork: - Your physician recommends that you have lab work today: renin-aldosterone level  Procedures/Testing: - Your physician has requested that you have a renal artery duplex. During this test, an ultrasound is used to evaluate blood flow to the kidneys. Allow one hour for this exam. Do not eat after midnight the day before and avoid carbonated beverages. Take your medications as you usually do.  Follow-Up: - Your physician wants you to follow-up in: 6 months with Dr. Graciela HusbandsKlein. You will receive a reminder letter in the mail two months in advance. If you don't receive a letter, please call our office to schedule the follow-up appointment.   Any Additional Special Instructions Will Be Listed Below (If Applicable).     If you need a refill on your cardiac medications before your next appointment, please call your pharmacy.

## 2017-03-12 NOTE — Progress Notes (Signed)
Patient Care Team: Karie Schwalbe, MD as PCP - General (Internal Medicine)   HPI  Heidi Carroll is a 76 y.o. female Seen in follow-up for atrial flutter for which he underwent catheter ablation 4/18.   Echocardiogram 1/18 EF normal 55-65%.  She has had multiple palpitations since then and underwent Holter monitoring>>> PVC                       Non sustained atrial tachycardia  180 bpm but only 4 beats                       There is some suggestion of atrial tach, not fast about 110                        She has had multiple trips to the emergency room because of flushing in the back of her head which is associated with hypertension. We started her on flecainide for her palpitations. These are somewhat improved. She feels like she is going crazy.  Records and Results Reviewed ER notes troponin and renal function are all normal   Past Medical History:  Diagnosis Date  . A-fib (HCC)   . Depression   . Neuropathy    idiopathic    Past Surgical History:  Procedure Laterality Date  . A-FLUTTER ABLATION N/A 02/04/2017   Procedure: A-Flutter Ablation;  Surgeon: Duke Salvia, MD;  Location: [facility] INVASIVE CV LAB;  Service: Cardiovascular;  Laterality: N/A;  . FOOT SURGERY  1995   bilateral   . SPINAL CORD STIMULATOR IMPLANT  12/1996   didn't keep  . VAGINAL DELIVERY  1963   X 1  . VAGINAL HYSTERECTOMY  1974    Current Outpatient Prescriptions  Medication Sig Dispense Refill  . acetaminophen (TYLENOL) 500 MG tablet Take 500 mg by mouth daily as needed for moderate pain or headache.    . ALPRAZolam (XANAX) 0.25 MG tablet TAKE 1/2 TO 1 (ONE-HALF TO ONE) TABLET BY MOUTH TWICE DAILY AS NEEDED FOR  PANIC  SYMPTOMS 20 tablet 0  . aspirin EC 81 MG tablet Take 81 mg by mouth daily.    . Calcium Carbonate-Vitamin D (CALCIUM 600+D PO) Take 1 tablet by mouth at bedtime.    Marland Kitchen diltiazem (CARDIZEM) 30 MG tablet Take 1 tablet by mouth daily as needed. If heart rate is over 130      . diltiazem (DILACOR XR) 120 MG 24 hr capsule Take 1 capsule (120 mg total) by mouth daily. 30 capsule 11  . flecainide (TAMBOCOR) 50 MG tablet Take 1 tablet (50 mg total) by mouth 2 (two) times daily. 60 tablet 6  . gabapentin (NEURONTIN) 600 MG tablet TAKE ONE TABLET BY MOUTH AT BEDTIME 90 tablet 3  . losartan (COZAAR) 50 MG tablet Take 1 tablet (50 mg total) by mouth daily. 30 tablet 0  . sertraline (ZOLOFT) 50 MG tablet Take 1 tablet (50 mg total) by mouth daily. 30 tablet 1   No current facility-administered medications for this visit.     Allergies  Allergen Reactions  . Cymbalta [Duloxetine Hcl] Nausea Only  . Wellbutrin [Bupropion] Other (See Comments)    Elevated blood pressure, anxiety  . Nicotine Palpitations    Nicotine Patches   . Penicillins Rash    Has patient had a PCN reaction causing immediate rash, facial/tongue/throat swelling, SOB or lightheadedness with hypotension yes Has patient  had a PCN reaction causing severe rash involving mucus membranes or skin necrosis: no Has patient had a PCN reaction that required hospitalization no Has patient had a PCN reaction occurring within the last 10 years: no If all of the above answers are "NO", then may proceed with Cephalosporin use.      Review of Systems negative except from HPI and PMH  Physical Exam BP (!) 160/80 (BP Location: Left Arm, Patient Position: Sitting, Cuff Size: Normal)   Pulse (!) 59   Ht 5\' 3"  (1.6 m)   Wt 147 lb 8 oz (66.9 kg)   BMI 26.13 kg/m  Well developed and well nourished in no acute distress HENT normal E scleral and icterus clear Neck Supple JVP flat; carotids brisk and full Clear to ausculation  Regular rate and rhythm, no murmurs gallops or rub Soft with active bowel sounds No clubbing cyanosis  Edema Alert and oriented, grossly normal motor and sensory function Skin Warm and Dry  ECG demonstrates sinus rhythm at 67 with intervals 14/09/42  Assessment and  Plan  Atrial  flutter status post ablation  Palpitations with PVCs and PACs  Hypertension  Anxiety  Rhythm issues are relatively quiescent; we're using flecainide for control of palpitations which she is tolerating and has improved palpitations.  The blood pressure issues are acute and a challenge given her repeated visits to the emergency room. We will increase her baseline medications to cover a.m. spikes; diltiazem 120 daily--twice a day losartan 50 daily--twice a day. We will also give her prescription for hydralazine 25 mg to take as needed for blood pressure over 170. I wonder also as to the mechanism of this. We will check a renin aldosterone level as well as a renal vascular ultrasound.  She is scheduled to follow-up with her PCP next month. We will see her in 6 months for further management of her palpitations.  We spent more than 50% of our >25 min visit in face to face counseling regarding the above      Current medicines are reviewed at length with the patient today .  The patient does not  have concerns regarding medicines.

## 2017-03-13 ENCOUNTER — Telehealth: Payer: Self-pay | Admitting: Internal Medicine

## 2017-03-13 NOTE — Telephone Encounter (Signed)
Patient Name: Heidi Carroll DOB: 1941-08-29 Initial Comment Caller says she has high blood pressure and right now it is 107/63. Says that her heart is beating faster than normal . Nurse Assessment Nurse: Pollyann SavoyZayas, RN, Melissa Date/Time (Eastern Time): 03/13/2017 2:47:13 PM Confirm and document reason for call. If symptomatic, describe symptoms. ---Caller says she has high blood pressure and right now it is 107/63. Says that her heart is beating faster than normal . States HR is 88-usually HR is 55-65/min. States feels like heart is beating out of her chest. On Diltiazem 120mg  i po bid, Flecainide50mg  i po bid, Losartan 50mg  i po bid, and Hydralazine25mg -take if goes over 170. Losartan 25mg  i po bid, and Diltiazem 120mg  i po bid -was increased yest.yesterday. Does the patient have any new or worsening symptoms? ---Yes Will a triage be completed? ---Yes Related visit to physician within the last 2 weeks? ---Yes Does the PT have any chronic conditions? (i.e. diabetes, asthma, etc.) ---Yes List chronic conditions. ---HTN. Is this a behavioral health or substance abuse call? ---No Guidelines Guideline Title Affirmed Question Affirmed Notes Heart Rate and Heartbeat Questions Age > 60 years (Exception: brief heart beat symptoms that went away and now feels well) Final Disposition User See Physician within 4 Hours (or PCP triage) Pollyann SavoyZayas, RN, Melissa Comments Transferred to Cardiology-Dr Myrtie NeitherSteven Kleine, who prescribes pt HTN meds-ph# [phone]-pt informred to call this number for any BP or HR issues as he is cardiologist and has on-call MD this weekend-pt verbalizes understanding appt-transferred to Dr Odessa FlemingKlein's triage nurse to discuss sxs. Referrals REFERRED TO PCP OFFICE Disagree/Comply: Comply

## 2017-03-13 NOTE — Telephone Encounter (Signed)
Aware- looks like it was taken care of  Will cc to PCP

## 2017-03-13 NOTE — Telephone Encounter (Signed)
FYI to Dr Milinda Antisower; Dr Alphonsus SiasLetvak out of office with no computer access.

## 2017-03-13 NOTE — Telephone Encounter (Signed)
[facility] calls in stating pt keeps calling primary for complaints with HR/BP.  They reached out to our office b/c pt was seen yesterday by Dr. Graciela HusbandsKlein who changed her medications.  Pt reporting pressure 107/63 and feeling sluggish w/ no energy.  States her heart is "pounding" w/ rates in the 80s. Diltiazem and Losartan were changed to BID yesterday.  Will review w/ DOD, Dr. Delton SeeNelson for advisement. Pt understands I will call her back

## 2017-03-13 NOTE — Telephone Encounter (Signed)
New Message:    Heidi KaufmannMelissa  needs to speak to the triage nurse-

## 2017-03-13 NOTE — Telephone Encounter (Signed)
Instructed pt to keep taking Diltiazem BID and decrease Losartan back to once daily. Advised to contact office if this does not make an improvement in  BP/symptoms.  Advised to call back if > 130/140s. Patient verbalized understanding and agreeable to plan.

## 2017-03-14 ENCOUNTER — Telehealth: Payer: Self-pay | Admitting: Cardiovascular Disease

## 2017-03-14 NOTE — Telephone Encounter (Signed)
Ms. Heidi Carroll called to report that she is continuing to have nausea with bouts of emesis and also loose stools.  This has been going on for nearly one week, and resulted in an ED visit on Wednesday of last week where her workup was unremarkable.  In this setting she has had some intermittently low BPs and was instructed to reduce her losartan to once daily dosing.  This evening her SBP is in the 150s.  She denies CP, SOB, DOE, dizziness, lightheadedness, visual disturbances, or presyncopal symptoms.  I reassured her that her GI symptoms are unlikely to be cardiac related and instructed to continue taking her diltiazem and losartan as instructed.  I also told her to present to the ED should she develop intractable vomiting or diarrhea, or should her SBP be sustained below 100mmHg.  She will also contact her PCP on Monday to schedule further evaluation.  Azalee CoursePaul S. Corotto MD

## 2017-03-18 ENCOUNTER — Telehealth: Payer: Self-pay | Admitting: Internal Medicine

## 2017-03-18 NOTE — Telephone Encounter (Signed)
I reviewed with Dr Cooper--Dr Excell Seltzerooper did not recommend any changes at this time, monitor symptoms, call if symptoms change or other concerns.  I spoke with patient, she is aware no new recommendations at this time. Pt states her heart rate and BP are 151/68 and 85 now, pt is asymptomatic.  Pt advised to continue to monitor BP and HR, symptoms, call if concerns.  I will forward to Dr Graciela HusbandsKlein for review.

## 2017-03-18 NOTE — Telephone Encounter (Signed)
New Message     Her heart rate is fluctuating between 44 to 144 , this just started today , her heart feels like its jumping out of her chest

## 2017-03-18 NOTE — Telephone Encounter (Signed)
Patient Name: Heidi Carroll  DOB: Nov 09, 1940    Initial Comment Caller states that she is having blood pressure problem. The last reading she took was 97/82 and that was about 5 minutes ago. She says it has been low all afternoon and this is the lowest. She is out of breathe. Her oxygen is 97 and heart rate is 61.    Nurse Assessment  Nurse: Joylene DraftMills-Hernandez, RN, Harriett SineNancy Date/Time (Eastern Time): 03/18/2017 7:06:31 PM  Confirm and document reason for call. If symptomatic, describe symptoms. ---Caller states that she is having blood pressure problem. The last reading she took was 97/82 and that was about 5 minutes ago. She says it has been low all afternoon and this is the lowest. She is out of breathe. Her oxygen is 97 and heart rate is 66. She states that she is having some shortness of breath.  Does the patient have any new or worsening symptoms? ---Yes  Will a triage be completed? ---Yes  Related visit to physician within the last 2 weeks? ---No  Does the PT have any chronic conditions? (i.e. diabetes, asthma, etc.) ---Yes  List chronic conditions. ---HTN  Is this a behavioral health or substance abuse call? ---No     Guidelines    Guideline Title Affirmed Question Affirmed Notes  Low Blood Pressure [1] Systolic BP 90-110 AND [2] taking blood pressure medications AND [3] NOT dizzy, lightheaded or weak    Final Disposition User   See Physician within 24 Hours Mills-Hernandez, RN, Harriett SineNancy    Referrals  REFERRED TO PCP OFFICE   Disagree/Comply: Danella Maiersomply

## 2017-03-18 NOTE — Telephone Encounter (Signed)
Pt is calling because about 10 AM this morning she noted her heart rate was 144. Pt states she took a diltiazem 30 mg about 10 AM. Pt states a little later she noted her heart rate to be 44, she was a little lightheaded. Pt states now her BP is 135/67 heart rate 89 O2 Sat 96%, pt states she is asymptomatic at this time. Pt has been checking her BP and HR with an automatic BP monitor. Pt states the only thing that has changed in the last week was that she has stopped Zoloft.

## 2017-03-19 LAB — ALDOSTERONE + RENIN ACTIVITY W/ RATIO
ALDOS/RENIN RATIO: 5.7 (ref 0.0–30.0)
ALDOSTERONE: 2.6 ng/dL (ref 0.0–30.0)
RENIN: 0.457 ng/mL/h (ref 0.167–5.380)

## 2017-03-19 NOTE — Telephone Encounter (Signed)
Patient took her BP med at 8:00, took her BP while I was on the phone with her and she said it was 167/77.  She said she didn't sleep good last night and so she feels tired right now. She said if she has to she will to Urgent Care.

## 2017-03-19 NOTE — Telephone Encounter (Signed)
I spoke with pt and she took BP this morning BP 160/80 but she has not taken any med this morning; pulse ox 97%. There [facility] or [place] and [facility]. Pt will go to UC this morning. Pt understands Dr Alphonsus SiasLetvak is out of office and wants note to go to Dr Dayton MartesAron. FYI to dr Dayton MartesAron.

## 2017-03-19 NOTE — Telephone Encounter (Signed)
I do think she should be seen at least before the weekend.  How is she feeling now?  Did she take her blood pressure medication?

## 2017-03-20 NOTE — Telephone Encounter (Signed)
I spoke with the patient today regarding lab results. I inquired how her HR's have been- she states Wednesday was bad, but yesterday and today have been good for her HR & BP readings.  I advised we will make no changes today- advised she keep her renal artery ultrasound scheduled for June 25. She is agreeable.

## 2017-03-24 NOTE — Telephone Encounter (Signed)
Seeing her tomorrow

## 2017-03-24 NOTE — Telephone Encounter (Signed)
She has an appt tomorrow for me to see her We will review all at that point Remarkably labile BP recently---unclear why

## 2017-03-25 ENCOUNTER — Encounter: Payer: Self-pay | Admitting: Internal Medicine

## 2017-03-25 ENCOUNTER — Ambulatory Visit (INDEPENDENT_AMBULATORY_CARE_PROVIDER_SITE_OTHER): Payer: Medicare Other | Admitting: Internal Medicine

## 2017-03-25 VITALS — BP 130/64 | HR 56 | Temp 98.0°F | Wt 148.8 lb

## 2017-03-25 DIAGNOSIS — I483 Typical atrial flutter: Secondary | ICD-10-CM | POA: Diagnosis not present

## 2017-03-25 DIAGNOSIS — I10 Essential (primary) hypertension: Secondary | ICD-10-CM | POA: Diagnosis not present

## 2017-03-25 DIAGNOSIS — F39 Unspecified mood [affective] disorder: Secondary | ICD-10-CM

## 2017-03-25 MED ORDER — LOSARTAN POTASSIUM 50 MG PO TABS: 25.0000 mg | ORAL_TABLET | Freq: Every day | ORAL | 0 refills | Status: DC

## 2017-03-25 MED ORDER — DILTIAZEM HCL ER 120 MG PO CP24: 120.0000 mg | ORAL_CAPSULE | Freq: Every day | ORAL | 0 refills | Status: DC

## 2017-03-25 NOTE — Assessment & Plan Note (Signed)
Seems some better Didn't tolerate the med---will hold off on other Rx for now

## 2017-03-25 NOTE — Progress Notes (Signed)
Subjective:    Patient ID: Heidi Carroll, female    DOB: 1941/04/13, 76 y.o.   MRN: [id number]  HPI Here for follow up of BP and medication problems  Didn't tolerate the sertraline for her mood Had nausea Uses the alprazolam for prn   BP has been low at times now As low as 93 systolic Only taking the losartan once a day as a consequence No high levels of late Didn't tolerate diltiazem bid--only taking in AM (and taking 30mg  at night) Heart will "flip flop" for 15 minutes at a time--but no racing like before All HR under 100 recently---and as low as 42 (felt bad then--tired)  Current Outpatient Prescriptions on File Prior to Visit  Medication Sig Dispense Refill  . acetaminophen (TYLENOL) 500 MG tablet Take 500 mg by mouth daily as needed for moderate pain or headache.    . ALPRAZolam (XANAX) 0.25 MG tablet TAKE 1/2 TO 1 (ONE-HALF TO ONE) TABLET BY MOUTH TWICE DAILY AS NEEDED FOR  PANIC  SYMPTOMS 20 tablet 0  . aspirin EC 81 MG tablet Take 81 mg by mouth daily.    . Calcium Carbonate-Vitamin D (CALCIUM 600+D PO) Take 1 tablet by mouth at bedtime.    Marland Kitchen. diltiazem (CARDIZEM) 30 MG tablet Take 1 tablet by mouth daily as needed. If heart rate is over 130    . diltiazem (DILACOR XR) 120 MG 24 hr capsule Take 1 capsule (120 mg total) by mouth 2 (two) times daily. (Patient taking differently: Take 120 mg by mouth daily. ) 180 capsule 3  . flecainide (TAMBOCOR) 50 MG tablet Take 1 tablet (50 mg total) by mouth 2 (two) times daily. 60 tablet 6  . gabapentin (NEURONTIN) 600 MG tablet TAKE ONE TABLET BY MOUTH AT BEDTIME 90 tablet 3  . hydrALAZINE (APRESOLINE) 25 MG tablet Take one tablet (25 mg) by mouth every 6 hours as needed for a blood pressure >170 on the top number 30 tablet 3  . losartan (COZAAR) 50 MG tablet Take 1 tablet (50 mg total) by mouth 2 (two) times daily. (Patient taking differently: Take 50 mg by mouth daily. ) 180 tablet 3   No current facility-administered medications on  file prior to visit.     Allergies  Allergen Reactions  . Cymbalta [Duloxetine Hcl] Nausea Only  . Wellbutrin [Bupropion] Other (See Comments)    Elevated blood pressure, anxiety  . Nicotine Palpitations    Nicotine Patches   . Penicillins Rash    Has patient had a PCN reaction causing immediate rash, facial/tongue/throat swelling, SOB or lightheadedness with hypotension yes Has patient had a PCN reaction causing severe rash involving mucus membranes or skin necrosis: no Has patient had a PCN reaction that required hospitalization no Has patient had a PCN reaction occurring within the last 10 years: no If all of the above answers are "NO", then may proceed with Cephalosporin use.    Past Medical History:  Diagnosis Date  . A-fib (HCC)   . Depression   . Neuropathy    idiopathic    Past Surgical History:  Procedure Laterality Date  . A-FLUTTER ABLATION N/A 02/04/2017   Procedure: A-Flutter Ablation;  Surgeon: Duke SalviaSteven C Klein, MD;  [facility];  Service: Cardiovascular;  Laterality: N/A;  . FOOT SURGERY  1995   bilateral   . SPINAL CORD STIMULATOR IMPLANT  12/1996   didn't keep  . VAGINAL DELIVERY  1963   X 1  . VAGINAL HYSTERECTOMY  1974    Family History  Problem Relation Age of Onset  . Cancer Father        colon  . Heart attack Brother   . Diabetes Maternal Grandfather     Social History   Social History  . Marital status: Widowed    Spouse name: N/A  . Number of children: 1  . Years of education: N/A   Occupational History  . Retired, [employer])    Social History Main Topics  . Smoking status: Current Some Day Smoker    Packs/day: 0.25    Types: Cigarettes  . Smokeless tobacco: Never Used     Comment: 2-3 a day  . Alcohol use No  . Drug use: No  . Sexual activity: Not on file   Other Topics Concern  . Not on file   Social History Narrative   Husband died 2023/09/07      Has living will   Has health care POA--- Chaya Jan, friend   Requests DNR--- form done 12/22/14   No tube feedings if cognitively unaware   Review of Systems Breathing is okay Has woken from sleep "in a panic" a couple of times--but sleeping better overall Depression is better---plans to get out and "find something to do" once health straightens out    Objective:   Physical Exam  Constitutional: She appears well-developed and well-nourished. No distress.  Neck: No thyromegaly present.  Cardiovascular: Normal rate, regular rhythm and normal heart sounds.  Exam reveals no gallop.   No murmur heard. HR ~60 with frequent extra beats  Pulmonary/Chest: Effort normal. No respiratory distress. She has no wheezes. She has no rales.  Decreased breath sounds  Lymphadenopathy:    She has no cervical adenopathy.          Assessment & Plan:

## 2017-03-25 NOTE — Assessment & Plan Note (Signed)
No apparent recurrence 

## 2017-03-25 NOTE — Assessment & Plan Note (Signed)
BP Readings from Last 3 Encounters:  03/25/17 130/64  03/12/17 (!) 160/80  03/11/17 (!) 158/70   Better now Discussed the med regimen

## 2017-03-31 ENCOUNTER — Other Ambulatory Visit: Payer: Self-pay | Admitting: Internal Medicine

## 2017-04-01 NOTE — Telephone Encounter (Signed)
Last filled 02-26-17 #20 Last OV 03-25-17 Next OV 05-01-17  No UDS

## 2017-04-01 NOTE — Telephone Encounter (Signed)
Left refill on voice mail at pharmacy  

## 2017-04-01 NOTE — Telephone Encounter (Signed)
Approved: 30 x 0 

## 2017-04-06 ENCOUNTER — Encounter: Payer: Self-pay | Admitting: Internal Medicine

## 2017-04-06 ENCOUNTER — Ambulatory Visit (INDEPENDENT_AMBULATORY_CARE_PROVIDER_SITE_OTHER): Payer: Medicare Other | Admitting: Internal Medicine

## 2017-04-06 ENCOUNTER — Encounter: Payer: Medicare Other | Admitting: Internal Medicine

## 2017-04-06 VITALS — BP 130/78 | HR 49 | Temp 98.1°F | Wt 149.0 lb

## 2017-04-06 DIAGNOSIS — F39 Unspecified mood [affective] disorder: Secondary | ICD-10-CM

## 2017-04-06 DIAGNOSIS — I483 Typical atrial flutter: Secondary | ICD-10-CM

## 2017-04-06 DIAGNOSIS — I10 Essential (primary) hypertension: Secondary | ICD-10-CM | POA: Diagnosis not present

## 2017-04-06 MED ORDER — ALPRAZOLAM 0.25 MG PO TABS: 0.1250 mg | ORAL_TABLET | Freq: Two times a day (BID) | ORAL | 0 refills | Status: DC | PRN

## 2017-04-06 NOTE — Assessment & Plan Note (Signed)
Still seems regular now

## 2017-04-06 NOTE — Assessment & Plan Note (Signed)
Doesn't seem to have any more than reactive depression now Getting out more and may be caregiver for neighbor---this would be good Mostly just anxiety---will have her take the 1/2 alprazolam bid regularly till follow up

## 2017-04-06 NOTE — Progress Notes (Signed)
Subjective:    Patient ID: Heidi Carroll, female    DOB: March 30, 1941, 76 y.o.   MRN: [id number]  HPI Here due to worsening anxiety "My nerves are shot--every day" Has tried 1/2 of alprazolam but is reluctant to take for dependence concerns This does help Some days, has taken 1/2 bid --this will keep her settled down pretty well  Some degree of sadness persists Has been getting out of the house--- goes out with neighbors to eat, shops, yard sales, likes to [place] rides in the country  Has cut back on caffeine--notes palpitations from this Changed to decaf HR has not been so fast lately  Current Outpatient Prescriptions on File Prior to Visit  Medication Sig Dispense Refill  . acetaminophen (TYLENOL) 500 MG tablet Take 500 mg by mouth daily as needed for moderate pain or headache.    . ALPRAZolam (XANAX) 0.25 MG tablet TAKE 1/2 TO 1 (ONE-HALF TO ONE) TABLET BY MOUTH TWICE DAILY AS NEEDED FOR  PANIC  SYMPTOMS 20 tablet 0  . aspirin EC 81 MG tablet Take 81 mg by mouth daily.    . Calcium Carbonate-Vitamin D (CALCIUM 600+D PO) Take 1 tablet by mouth at bedtime.    Marland Kitchen. diltiazem (CARDIZEM) 30 MG tablet Take 1 tablet by mouth daily as needed. If heart rate is over 130    . diltiazem (DILACOR XR) 120 MG 24 hr capsule Take 1 capsule (120 mg total) by mouth daily. 1 capsule 0  . flecainide (TAMBOCOR) 50 MG tablet Take 1 tablet (50 mg total) by mouth 2 (two) times daily. 60 tablet 6  . gabapentin (NEURONTIN) 600 MG tablet TAKE ONE TABLET BY MOUTH AT BEDTIME 90 tablet 3  . hydrALAZINE (APRESOLINE) 25 MG tablet Take one tablet (25 mg) by mouth every 6 hours as needed for a blood pressure >170 on the top number 30 tablet 3  . losartan (COZAAR) 50 MG tablet Take 0.5 tablets (25 mg total) by mouth daily. 1 tablet 0   No current facility-administered medications on file prior to visit.     Allergies  Allergen Reactions  . Cymbalta [Duloxetine Hcl] Nausea Only  . Wellbutrin [Bupropion] Other (See  Comments)    Elevated blood pressure, anxiety  . Nicotine Palpitations    Nicotine Patches   . Penicillins Rash    Has patient had a PCN reaction causing immediate rash, facial/tongue/throat swelling, SOB or lightheadedness with hypotension yes Has patient had a PCN reaction causing severe rash involving mucus membranes or skin necrosis: no Has patient had a PCN reaction that required hospitalization no Has patient had a PCN reaction occurring within the last 10 years: no If all of the above answers are "NO", then may proceed with Cephalosporin use.    Past Medical History:  Diagnosis Date  . A-fib (HCC)   . Depression   . Neuropathy    idiopathic    Past Surgical History:  Procedure Laterality Date  . A-FLUTTER ABLATION N/A 02/04/2017   Procedure: A-Flutter Ablation;  Surgeon: Duke SalviaSteven C Klein, MD;  [facility];  Service: Cardiovascular;  Laterality: N/A;  . FOOT SURGERY  1995   bilateral   . SPINAL CORD STIMULATOR IMPLANT  12/1996   didn't keep  . VAGINAL DELIVERY  1963   X 1  . VAGINAL HYSTERECTOMY  1974    Family History  Problem Relation Age of Onset  . Cancer Father        colon  . Heart attack  Brother   . Diabetes Maternal Grandfather     Social History   Social History  . Marital status: Widowed    Spouse name: N/A  . Number of children: 1  . Years of education: N/A   Occupational History  . Retired, [employer])    Social History Main Topics  . Smoking status: Current Some Day Smoker    Packs/day: 0.25    Types: Cigarettes  . Smokeless tobacco: Never Used     Comment: 2-3 a day  . Alcohol use No  . Drug use: No  . Sexual activity: Not on file   Other Topics Concern  . Not on file   Social History Narrative   Husband died 09/06/23      Has living will   Has health care POA--- Chaya Jan, friend   Requests DNR--- form done 12/22/14   No tube feedings if cognitively unaware   Review of Systems Sleeping okay--but  awakens ~4AM (after going to be ~11PM) She knows she needs to start doing something to stay busy--but hasn't done it yet Neighbor in hospital--she plans to help her in mornings when she gets home Appetite is not great--but "I make myself eat" Weight fairly stable    Objective:   Physical Exam  Constitutional: She appears well-developed. No distress.  Neck: No thyromegaly present.  Cardiovascular: Regular rhythm and normal heart sounds.  Exam reveals no gallop.   No murmur heard. Slow with occasional skips  Pulmonary/Chest: Effort normal and breath sounds normal. No respiratory distress. She has no wheezes. She has no rales.  Lymphadenopathy:    She has no cervical adenopathy.  Psychiatric:  Not overtly depressed Good discussion about her past--and enjoying going out to visit area where she grew up Mild anxiety          Assessment & Plan:

## 2017-04-06 NOTE — Assessment & Plan Note (Signed)
BP Readings from Last 3 Encounters:  04/06/17 130/78  03/25/17 130/64  03/12/17 (!) 160/80   Better now No changes

## 2017-04-13 ENCOUNTER — Telehealth: Payer: Self-pay | Admitting: Nurse Practitioner

## 2017-04-13 NOTE — Telephone Encounter (Signed)
Patient calling and would like to know why she is having the Renal on 04-20-17.Thanks.

## 2017-04-13 NOTE — Telephone Encounter (Signed)
Not sure why [facility] came [facility], pt wanted to know why she was having a renal us.  It was clearly discussed in Dr. Odessa FlemingKlein's note due to HTN and Margaretmary DysHeather McGee, RN, confirmed pt's appointment. Stated pt needs test due to HTN.   Went over prior test instructions.  Pt stated appreciation.

## 2017-04-17 ENCOUNTER — Telehealth: Payer: Self-pay | Admitting: Internal Medicine

## 2017-04-17 NOTE — Telephone Encounter (Signed)
Spoke with patient to confirm renal u/s on Monday morning.  She wants to know if she should take her 6 am dose of flecainide as she normally does this with food and will be fasting for her test.  Please call.

## 2017-04-17 NOTE — Telephone Encounter (Signed)
Reviewed with patient that she could take her pills as normal with small sip of water that morning. She was appreciative for the call and had no further questions at this time.

## 2017-04-20 ENCOUNTER — Ambulatory Visit: Payer: Medicare Other

## 2017-04-20 DIAGNOSIS — I10 Essential (primary) hypertension: Secondary | ICD-10-CM

## 2017-04-27 ENCOUNTER — Telehealth: Payer: Self-pay

## 2017-04-27 NOTE — Telephone Encounter (Signed)
Patient is aware of normal study. Also aware that results were sent to PCP. She was very grateful and relieved for the results.

## 2017-05-01 ENCOUNTER — Encounter: Payer: Self-pay | Admitting: Internal Medicine

## 2017-05-01 ENCOUNTER — Ambulatory Visit: Payer: Medicare Other | Admitting: Internal Medicine

## 2017-05-01 ENCOUNTER — Ambulatory Visit (INDEPENDENT_AMBULATORY_CARE_PROVIDER_SITE_OTHER): Payer: Medicare Other | Admitting: Internal Medicine

## 2017-05-01 VITALS — BP 126/76 | HR 41 | Temp 97.6°F | Wt 149.0 lb

## 2017-05-01 DIAGNOSIS — F39 Unspecified mood [affective] disorder: Secondary | ICD-10-CM

## 2017-05-01 DIAGNOSIS — I483 Typical atrial flutter: Secondary | ICD-10-CM | POA: Diagnosis not present

## 2017-05-01 DIAGNOSIS — I10 Essential (primary) hypertension: Secondary | ICD-10-CM

## 2017-05-01 NOTE — Progress Notes (Signed)
Subjective:    Patient ID: Heidi Carroll, female    DOB: Feb 14, 1941, 76 y.o.   MRN: [id number]  HPI Here for follow up of her mood and labile BP  Is doing better BP has been good Highest she has noted is 138 systolic May be as low as 112  May feel slightly weak then  Heart rate in low 40's at times Does go up with exertion Does do some yard work--but tires quickly (avoids the worst of the heat)  Has been helping the lady next door--volunteering  Good for both of them Has 1 dog again (cat and bird) Not depressed like before Does get lonesome at night--will get nervous in afternoon Takes alprazolam and it helps  Current Outpatient Prescriptions on File Prior to Visit  Medication Sig Dispense Refill  . acetaminophen (TYLENOL) 500 MG tablet Take 500 mg by mouth daily as needed for moderate pain or headache.    . ALPRAZolam (XANAX) 0.25 MG tablet Take 0.5-1 tablets (0.125-0.25 mg total) by mouth 2 (two) times daily as needed for anxiety. 60 tablet 0  . aspirin EC 81 MG tablet Take 81 mg by mouth daily.    . Calcium Carbonate-Vitamin D (CALCIUM 600+D PO) Take 1 tablet by mouth at bedtime.    Marland Kitchen diltiazem (CARDIZEM) 30 MG tablet Take 1 tablet by mouth daily as needed. If heart rate is over 130    . diltiazem (DILACOR XR) 120 MG 24 hr capsule Take 1 capsule (120 mg total) by mouth daily. 1 capsule 0  . flecainide (TAMBOCOR) 50 MG tablet Take 1 tablet (50 mg total) by mouth 2 (two) times daily. 60 tablet 6  . gabapentin (NEURONTIN) 600 MG tablet TAKE ONE TABLET BY MOUTH AT BEDTIME 90 tablet 3  . hydrALAZINE (APRESOLINE) 25 MG tablet Take one tablet (25 mg) by mouth every 6 hours as needed for a blood pressure >170 on the top number 30 tablet 3  . losartan (COZAAR) 50 MG tablet Take 0.5 tablets (25 mg total) by mouth daily. 1 tablet 0   No current facility-administered medications on file prior to visit.     Allergies  Allergen Reactions  . Cymbalta [Duloxetine Hcl] Nausea Only  .  Wellbutrin [Bupropion] Other (See Comments)    Elevated blood pressure, anxiety  . Nicotine Palpitations    Nicotine Patches   . Penicillins Rash    Has patient had a PCN reaction causing immediate rash, facial/tongue/throat swelling, SOB or lightheadedness with hypotension yes Has patient had a PCN reaction causing severe rash involving mucus membranes or skin necrosis: no Has patient had a PCN reaction that required hospitalization no Has patient had a PCN reaction occurring within the last 10 years: no If all of the above answers are "NO", then may proceed with Cephalosporin use.    Past Medical History:  Diagnosis Date  . A-fib (HCC)   . Depression   . Neuropathy    idiopathic    Past Surgical History:  Procedure Laterality Date  . A-FLUTTER ABLATION N/A 02/04/2017   Procedure: A-Flutter Ablation;  Surgeon: Duke Salvia, MD;  Location: [facility] INVASIVE CV LAB;  Service: Cardiovascular;  Laterality: N/A;  . FOOT SURGERY  1995   bilateral   . SPINAL CORD STIMULATOR IMPLANT  12/1996   didn't keep  . VAGINAL DELIVERY  1963   X 1  . VAGINAL HYSTERECTOMY  1974    Family History  Problem Relation Age of Onset  . Cancer Father  colon  . Heart attack Brother   . Diabetes Maternal Grandfather     Social History   Social History  . Marital status: Widowed    Spouse name: N/A  . Number of children: 1  . Years of education: N/A   Occupational History  . Retired, [employer]([place])    Social History Main Topics  . Smoking status: Current Some Day Smoker    Packs/day: 0.25    Types: Cigarettes  . Smokeless tobacco: Never Used     Comment: 2-3 a day  . Alcohol use No  . Drug use: No  . Sexual activity: Not on file   Other Topics Concern  . Not on file   Social History Narrative   Husband died 10/14      Has living will   Has health care POA--- [name], friend   Requests DNR--- form done 12/22/14   No tube feedings if cognitively unaware   Review  of Systems  Sleeps okay in general--occ takes another 1/2 alprazolam Appetite still not very good--does eat at least 2 meals Weight is stable Still not ready to stop smoking    Objective:   Physical Exam  Constitutional: She appears well-nourished. No distress.  Cardiovascular: Regular rhythm and normal heart sounds.  Exam reveals no gallop.   No murmur heard. Slow with trigeminy at times   Pulmonary/Chest: Effort normal. No respiratory distress. She has no wheezes. She has no rales.  Decreased breath sounds  Musculoskeletal: She exhibits no edema.  Psychiatric: She has a normal mood and affect. Her behavior is normal.          Assessment & Plan:

## 2017-05-01 NOTE — Assessment & Plan Note (Signed)
Ongoing mild anxiety Will continue the alprazolam up to bid (1/2)

## 2017-05-01 NOTE — Assessment & Plan Note (Signed)
Controlled with meds No clear if fatigue is from this or treatment--but may improve again over time

## 2017-05-01 NOTE — Assessment & Plan Note (Signed)
BP Readings from Last 3 Encounters:  05/01/17 126/76  04/06/17 130/78  03/25/17 130/64   Good now

## 2017-05-14 ENCOUNTER — Telehealth: Payer: Self-pay

## 2017-05-14 NOTE — Telephone Encounter (Signed)
That is okay It is still a low dose Change on the med list and find out if she needs more

## 2017-05-14 NOTE — Telephone Encounter (Signed)
Pt left v/m; pt has been taking xanax  0.25 mg taking 1/2 tab when she needs it. Her nerves are bothering her more. Pt thinking about increasing to 1 tab bid prn. Pt knows that is what it says on bottle but pt wants approval from Dr Alphonsus SiasLetvak to be sure. Pt said he does not have appt until he returns from vacation. Pt request cb.

## 2017-05-15 NOTE — Telephone Encounter (Signed)
Spoke to pt. Made a correction in med list

## 2017-05-21 ENCOUNTER — Emergency Department
Admission: EM | Admit: 2017-05-21 | Discharge: 2017-05-21 | Disposition: A | Discharge status: Home / Self Care | Payer: Medicare Other | Attending: Emergency Medicine | Admitting: Emergency Medicine

## 2017-05-21 ENCOUNTER — Emergency Department: Payer: Medicare Other

## 2017-05-21 ENCOUNTER — Telehealth: Payer: Self-pay | Admitting: Internal Medicine

## 2017-05-21 DIAGNOSIS — F17210 Nicotine dependence, cigarettes, uncomplicated: Secondary | ICD-10-CM | POA: Diagnosis not present

## 2017-05-21 DIAGNOSIS — R001 Bradycardia, unspecified: Secondary | ICD-10-CM | POA: Diagnosis not present

## 2017-05-21 DIAGNOSIS — I4891 Unspecified atrial fibrillation: Secondary | ICD-10-CM | POA: Diagnosis not present

## 2017-05-21 DIAGNOSIS — I10 Essential (primary) hypertension: Secondary | ICD-10-CM | POA: Diagnosis not present

## 2017-05-21 DIAGNOSIS — Z7982 Long term (current) use of aspirin: Secondary | ICD-10-CM | POA: Diagnosis not present

## 2017-05-21 DIAGNOSIS — J449 Chronic obstructive pulmonary disease, unspecified: Secondary | ICD-10-CM | POA: Diagnosis not present

## 2017-05-21 DIAGNOSIS — Z79899 Other long term (current) drug therapy: Secondary | ICD-10-CM | POA: Insufficient documentation

## 2017-05-21 LAB — MAGNESIUM: MAGNESIUM: 2 mg/dL (ref 1.7–2.4)

## 2017-05-21 LAB — BASIC METABOLIC PANEL
ANION GAP: 7 (ref 5–15)
BUN: 14 mg/dL (ref 6–20)
CALCIUM: 9.3 mg/dL (ref 8.9–10.3)
CHLORIDE: 109 mmol/L (ref 101–111)
CO2: 27 mmol/L (ref 22–32)
Creatinine, Ser: 0.75 mg/dL (ref 0.44–1.00)
GFR calc non Af Amer: >60 mL/min (ref >60)
GLUCOSE: 92 mg/dL (ref 65–99)
POTASSIUM: 4.2 mmol/L (ref 3.5–5.1)
Sodium: 143 mmol/L (ref 135–145)

## 2017-05-21 LAB — CBC
HEMATOCRIT: 47.8 % — AB (ref 35.0–47.0)
HEMOGLOBIN: 16.6 g/dL — AB (ref 12.0–16.0)
MCH: 33.2 pg (ref 26.0–34.0)
MCHC: 34.6 g/dL (ref 32.0–36.0)
MCV: 95.9 fL (ref 80.0–100.0)
Platelets: 194 10*3/uL (ref 150–440)
RBC: 4.99 MIL/uL (ref 3.80–5.20)
RDW: 13.2 % (ref 11.5–14.5)
WBC: 6.7 10*3/uL (ref 3.6–11.0)

## 2017-05-21 LAB — TSH: TSH: 2.028 u[IU]/mL (ref 0.350–4.500)

## 2017-05-21 LAB — TROPONIN I

## 2017-05-21 LAB — PHOSPHORUS: Phosphorus: 3.4 mg/dL (ref 2.5–4.6)

## 2017-05-21 MED ORDER — CALCIUM GLUCONATE 10 % IV SOLN
1.0000 g | Freq: Once | INTRAVENOUS | Status: AC
  Administered 2017-05-21: 1 g via INTRAVENOUS
  Filled 2017-05-21: qty 10

## 2017-05-21 NOTE — ED Notes (Signed)
Patient ambulated to restroom independently without difficulty. Denies dizziness.

## 2017-05-21 NOTE — Telephone Encounter (Signed)
Patient Name: [name] DOB: Dec 02, 1940 Initial Comment Caller's heart rate is 37 and she is having trouble breathing(heavy). Nurse Assessment Nurse: Yetta BarreJones, RN, Miranda Date/Time (Eastern Time): 05/21/2017 9:23:02 AM Confirm and document reason for call. If symptomatic, describe symptoms. ---Caller states her HR has been lower since she had ablation in April. Has been running 45-55. Today her HR is 34-37 and she trouble breathing. She thinks the breathing could be related to nerves. BP 150/79. Does the patient have any new or worsening symptoms? ---Yes Will a triage be completed? ---Yes Related visit to physician within the last 2 weeks? ---No Does the PT have any chronic conditions? (i.e. diabetes, asthma, etc.) ---Yes List chronic conditions. ---HTN, A-fib, Anxiety Is this a behavioral health or substance abuse call? ---No Guidelines Guideline Title Affirmed Question Affirmed Notes Heart Rate and Heartbeat Questions Heart beating very slowly (e.g., < 50 / minute) (Exception: athlete) Final Disposition User Go to ED Now Yetta BarreJones, RN, Miranda Referrals [facility] - ED Disagree/Comply: Comply

## 2017-05-21 NOTE — ED Triage Notes (Addendum)
Pt presents to ER from home c/o weakness and fatigue X 2 days. States HR was in 30's this AM at home. Denies CP or SOB. Pt alert and oriented X4, active, cooperative, pt in NAD. RR even and unlabored, color WNL.    Radial pulse palpated manually in addition to dinamap machine and EKG.

## 2017-05-21 NOTE — ED Notes (Signed)
Patient transported to X-ray 

## 2017-05-21 NOTE — Telephone Encounter (Signed)
Please call to check on pt later today.

## 2017-05-21 NOTE — Telephone Encounter (Signed)
Spoke with patient she said she's back at home she feels better than she did this morning. She has an appt with Dr. Alphonsus SiasLetvak on 05/29/17 and someone from the hospital is working on getting her an appt with her cardiologist. I advised her that if at any moment her symptoms come back to call 911 and to call on Monday to update us.  She said her neighbor across from her is keeping an eye on her.

## 2017-05-21 NOTE — ED Provider Notes (Signed)
[facility] Emergency Department Provider Note  ____________________________________________  Time seen: Approximately 2:51 PM  I have reviewed the triage vital signs and the nursing notes.   HISTORY  Chief Complaint Bradycardia    HPI Heidi Carroll is a 76 y.o. female who complains of generalized weakness and fatigue for the last 2 days. This morning her home pop pulse oximeter noted her heart rate to be 30. No syncope. No chest pain or shortness of breath. She has a history of A. fib, status post ablation in April, on diltiazem 120 mg XR. Last at the diltiazem this morning. Also takes flecainide twice a day. Does not take beta blocker.     Past Medical History:  Diagnosis Date  . A-fib (HCC)   . Depression   . Neuropathy    idiopathic     Patient Active Problem List   Diagnosis Date Noted  . Situational anxiety 03/05/2017  . Atrial fibrillation (HCC) 03/02/2017  . Typical atrial flutter (HCC) 02/04/2017  . COPD (chronic obstructive pulmonary disease) (HCC) 11/18/2016  . Mood disorder (HCC) 11/18/2016  . Essential hypertension, benign 11/18/2016  . Nicotine dependence 12/31/2015  . Idiopathic peripheral neuropathy 04/02/2007     Past Surgical History:  Procedure Laterality Date  . A-FLUTTER ABLATION N/A 02/04/2017   Procedure: A-Flutter Ablation;  Surgeon: Duke SalviaSteven C Klein, MD;  [facility];  Service: Cardiovascular;  Laterality: N/A;  . FOOT SURGERY  1995   bilateral   . SPINAL CORD STIMULATOR IMPLANT  12/1996   didn't keep  . VAGINAL DELIVERY  1963   X 1  . VAGINAL HYSTERECTOMY  1974     Prior to Admission medications   Medication Sig Start Date End Date Taking? Authorizing Provider  acetaminophen (TYLENOL) 500 MG tablet Take 500 mg by mouth daily as needed for moderate pain or headache.   Yes [provider]  ALPRAZolam (XANAX) 0.25 MG tablet Take 0.125-0.25 mg by mouth 2 (two) times daily as needed for  anxiety.    Yes [provider]  aspirin EC 81 MG tablet Take 81 mg by mouth daily.   Yes [provider]  Calcium Carbonate-Vitamin D (CALCIUM 600+D PO) Take 1 tablet by mouth at bedtime.   Yes [provider]  diltiazem (CARDIZEM) 30 MG tablet Take 1 tablet by mouth daily as needed. If heart rate is over 130 01/14/17  Yes [provider]  diltiazem (DILACOR XR) 120 MG 24 hr capsule Take 1 capsule (120 mg total) by mouth daily. Patient taking differently: Take 120 mg by mouth 2 (two) times daily.  03/25/17  Yes Karie SchwalbeLetvak, Richard I, MD  flecainide (TAMBOCOR) 50 MG tablet Take 1 tablet (50 mg total) by mouth 2 (two) times daily. 03/06/17  Yes Duke SalviaKlein, Steven C, MD  gabapentin (NEURONTIN) 600 MG tablet TAKE ONE TABLET BY MOUTH AT BEDTIME 02/16/17  Yes Karie SchwalbeLetvak, Richard I, MD  hydrALAZINE (APRESOLINE) 25 MG tablet Take one tablet (25 mg) by mouth every 6 hours as needed for a blood pressure >170 on the top number 03/12/17  Yes Duke SalviaKlein, Steven C, MD  losartan (COZAAR) 50 MG tablet Take 0.5 tablets (25 mg total) by mouth daily. Patient taking differently: Take 50 mg by mouth 2 (two) times daily.  03/25/17 05/21/17 Yes Karie SchwalbeLetvak, Richard I, MD     Allergies Cymbalta [duloxetine hcl]; Wellbutrin [bupropion]; Nicotine; and Penicillins   Family History  Problem Relation Age of Onset  . Cancer Father  colon  . Heart attack Brother   . Diabetes Maternal Grandfather     Social History Social History  Substance Use Topics  . Smoking status: Current Some Day Smoker    Packs/day: 0.25    Types: Cigarettes  . Smokeless tobacco: Never Used     Comment: 2-3 a day  . Alcohol use No    Review of Systems  Constitutional:   No fever or chills.  ENT:   No sore throat. No rhinorrhea. Cardiovascular:   No chest pain or syncope. Respiratory:   No dyspnea or cough. Gastrointestinal:   Negative for abdominal pain, vomiting and diarrhea.  Musculoskeletal:   Negative for focal  pain or swelling All other systems reviewed and are negative except as documented above in ROS and HPI.  ____________________________________________   PHYSICAL EXAM:  VITAL SIGNS: ED Triage Vitals  Enc Vitals Group     BP 05/21/17 1003 (!) 158/67     Pulse Rate 05/21/17 1003 64     Resp 05/21/17 1033 (!) 22     Temp 05/21/17 1003 97.7 F (36.5 C)     Temp Source 05/21/17 1003 Oral     SpO2 05/21/17 1003 95 %     Weight 05/21/17 1005 149 lb (67.6 kg)     Height 05/21/17 1005 5\' 3"  (1.6 m)     Head Circumference --      Peak Flow --      Pain Score --      Pain Loc --      Pain Edu? --      Excl. in GC? --     Vital signs reviewed, nursing assessments reviewed.   Constitutional:   Alert and oriented. Well appearing and in no distress. Eyes:   No scleral icterus.  EOMI. No nystagmus. No conjunctival pallor. PERRL. ENT   Head:   Normocephalic and atraumatic.   Nose:   No congestion/rhinnorhea.    Mouth/Throat:   MMM, no pharyngeal erythema. No peritonsillar mass.    Neck:   No meningismus. Full ROM Hematological/Lymphatic/Immunilogical:   No cervical lymphadenopathy. Cardiovascular:   Irregularly irregular rhythm, bradycardia heart rate 45. Symmetric bilateral radial and DP pulses.  No murmurs.  Respiratory:   Normal respiratory effort without tachypnea/retractions. Breath sounds are clear and equal bilaterally. No wheezes/rales/rhonchi. Gastrointestinal:   Soft and nontender. Non distended. There is no CVA tenderness.  No rebound, rigidity, or guarding. Genitourinary:   deferred Musculoskeletal:   Normal range of motion in all extremities. No joint effusions.  No lower extremity tenderness.  No edema. Neurologic:   Normal speech and language.  Motor grossly intact. No gross focal neurologic deficits are appreciated.  Skin:    Skin is warm, dry and intact. No rash noted.  No petechiae, purpura, or bullae.  ____________________________________________     LABS (pertinent positives/negatives) (all labs ordered are listed, but only abnormal results are displayed) Labs Reviewed  CBC - Abnormal; Notable for the following:       Result Value   Hemoglobin 16.6 (*)    HCT 47.8 (*)    All other components within normal limits  BASIC METABOLIC PANEL  TROPONIN I  MAGNESIUM  PHOSPHORUS  TSH   ____________________________________________   EKG  Interpreted by me Atrial fibrillation rate of 64 with PACs every third beat. Normal axis intervals QRS ST segments and T waves.  ____________________________________________    RADIOLOGY  Dg Chest 2 View  Result Date: 05/21/2017 CLINICAL DATA:  76 year old female with  history weakness and fatigue for the past 2 days. Low heart rate. EXAM: CHEST  2 VIEW COMPARISON:  Chest x-ray 03/11/2017. FINDINGS: Lung volumes are normal. No consolidative airspace disease. No pleural effusions. No pneumothorax. No pulmonary nodule or mass noted. Pulmonary vasculature and the cardiomediastinal silhouette are within normal limits. Atherosclerosis in the thoracic aorta. IMPRESSION: 1.  No radiographic evidence of acute cardiopulmonary disease. 2. Aortic atherosclerosis. Aortic Atherosclerosis (ICD10-I70.0). Electronically Signed   By: Trudie Reed M.D.   On: 05/21/2017 10:29    ____________________________________________   PROCEDURES Procedures  ____________________________________________   INITIAL IMPRESSION / ASSESSMENT AND PLAN / ED COURSE  Pertinent labs & imaging results that were available during my care of the patient were reviewed by me and considered in my medical decision making (see chart for details).  Patient presents with mildly symptomatically bradycardia. Blood pressure stable. IV calcium gluconate, check labs including TSH and magnesium and phosphorus.  Clinical Course as of May 21 1450  Thu May 21, 2017  1206 Additional labs unremarkable. After calcium, heart rate is about 45-50,  A. fib, without couplets .  Offered observation in hospital, patient declines because she has multiple animals at home she needs to take care of, we'll try a trial of ambulation to recheck her symptoms now that her rhythm seems back to normal.  [PS]  1428 Feels better. Ambulated asymptomatically. Hr 50's on monitor, baseline a fib without couplets  [PS]  1447 D/w Dr. Mariah Milling.  Agrees with DC XR dilt, use dilt 30mg  IR bid - tid. Will facilitate cards clninic f/u  [PS]    Clinical Course User Index [PS] Sharman Cheek, MD     ____________________________________________   FINAL CLINICAL IMPRESSION(S) / ED DIAGNOSES  Final diagnoses:  Bradycardia  Atrial fibrillation with slow ventricular response (HCC)      New Prescriptions   No medications on file     Portions of this note were generated with dragon dictation software. Dictation errors may occur despite best attempts at proofreading.    Sharman Cheek, MD 05/21/17 [phone]

## 2017-05-22 ENCOUNTER — Telehealth: Payer: Self-pay

## 2017-05-22 ENCOUNTER — Other Ambulatory Visit: Payer: Self-pay

## 2017-05-22 ENCOUNTER — Telehealth: Payer: Self-pay | Admitting: *Deleted

## 2017-05-22 NOTE — Telephone Encounter (Signed)
She said she is feeling a little better today. Heart rate is up a little more. Still having some lack of energy.

## 2017-05-22 NOTE — Telephone Encounter (Signed)
Left voicemail message for patient to call back.

## 2017-05-22 NOTE — Patient Outreach (Signed)
[facility] Care Management  05/22/2017  Heidi Carroll June 20, 1941 [id number]   Telephone call to patient for ED Utilization screening.  Patient reports she is better but her heart issues are bothering her. Patient reports that she has an appointment with her primary doctor next week and is supposed to hear something from the cardiologist today for an appointment.  Encouraged patient to reach out to the cardiologist for follow up to make sure that things are being planned. She verbalized understanding.  Patient does share that one of her medications was decreased hoping to deal with her low heart rate.  Patient states that her heart rate troubles started after she used a stop smoking patch earlier this year she states at first it was high heart rate then low heart rate.  Patient expressed concern of her problems. D[facility] with [facility] and how we could help.  Patient states she would like to talk with her primary doctor and then get back with us.  I advised her that would be fine. Health Coach contact information given and 24 hour nurse line for future reference.  Also advised patient that I would send letter and brochure for future reference. She is agreeable.  Plan: RN Health Coach will send letter and brochure and hold case for ten business days.  If no response will close out case.   Heidi Lericheionne J Tywana Robotham, RN, MSN [facility] Care Management RN Telephonic Health Coach [phone]

## 2017-05-25 NOTE — Telephone Encounter (Signed)
Patient has appt on 05/28/17 with Dr Graciela HusbandsKlein as recommended by ED.

## 2017-05-28 ENCOUNTER — Ambulatory Visit (INDEPENDENT_AMBULATORY_CARE_PROVIDER_SITE_OTHER): Payer: Medicare Other | Admitting: Internal Medicine

## 2017-05-28 ENCOUNTER — Encounter: Payer: Self-pay | Admitting: Internal Medicine

## 2017-05-28 ENCOUNTER — Encounter: Payer: Self-pay | Admitting: *Deleted

## 2017-05-28 ENCOUNTER — Other Ambulatory Visit: Payer: Self-pay | Admitting: Internal Medicine

## 2017-05-28 VITALS — BP 128/80 | HR 58 | Ht 63.0 in | Wt 148.8 lb

## 2017-05-28 DIAGNOSIS — R001 Bradycardia, unspecified: Secondary | ICD-10-CM | POA: Diagnosis not present

## 2017-05-28 DIAGNOSIS — I493 Ventricular premature depolarization: Secondary | ICD-10-CM | POA: Diagnosis not present

## 2017-05-28 DIAGNOSIS — Z01812 Encounter for preprocedural laboratory examination: Secondary | ICD-10-CM | POA: Diagnosis not present

## 2017-05-28 DIAGNOSIS — I483 Typical atrial flutter: Secondary | ICD-10-CM | POA: Diagnosis not present

## 2017-05-28 DIAGNOSIS — I491 Atrial premature depolarization: Secondary | ICD-10-CM

## 2017-05-28 DIAGNOSIS — R002 Palpitations: Secondary | ICD-10-CM | POA: Diagnosis not present

## 2017-05-28 NOTE — Progress Notes (Signed)
Patient Care Team: Karie SchwalbeLetvak, Richard I, MD as PCP - General (Internal Medicine) Pleasant, Dennard SchaumannFrances H, RN as [facility] Care Management   HPI  Heidi Carroll is a 76 y.o. female Seen in follow-up for atrial flutter for which she underwent catheter ablation 4/18.   Echocardiogram 1/18 EF normal 55-65%.  She has had multiple palpitations since then and underwent Holter monitoring>>> PVC                       Non sustained atrial tachycardia  180 bpm but only 4 beats                       There is some suggestion of atrial tach, not fast about 110                        She has had multiple trips to the emergency room because of flushing in the back of her head which is associated with hypertension. We started her on flecainide for her palpitations. These are somewhat improved. She feels like she is going crazy.  DATE PR interval QRSduration Dose-Flecainide  5/18  132 90 0  8/18 NA 88 50   She was seen in the emergency room because of symptomatic "bradycardia ". Characterized by fatigue and shortness of breath.  Her  rate was 58 but there was coupling so taht effective HR was about 34.  No p waves were seen, except for one deflection on her ECG in ER ( as well as todays)    Records and Results Reviewed ER notes troponin and renal function are all normal   Past Medical History:  Diagnosis Date  . A-fib (HCC)   . Depression   . Neuropathy    idiopathic    Past Surgical History:  Procedure Laterality Date  . A-FLUTTER ABLATION N/A 02/04/2017   Procedure: A-Flutter Ablation;  Surgeon: Duke SalviaSteven C Klein, MD;  [facility];  Service: Cardiovascular;  Laterality: N/A;  . FOOT SURGERY  1995   bilateral   . SPINAL CORD STIMULATOR IMPLANT  12/1996   didn't keep  . VAGINAL DELIVERY  1963   X 1  . VAGINAL HYSTERECTOMY  1974    Current Outpatient Prescriptions  Medication Sig Dispense Refill  . acetaminophen (TYLENOL) 500 MG tablet Take 500 mg by  mouth daily as needed for moderate pain or headache.    . ALPRAZolam (XANAX) 0.25 MG tablet Take 0.125-0.25 mg by mouth 2 (two) times daily as needed for anxiety.     Marland Kitchen. aspirin EC 81 MG tablet Take 81 mg by mouth daily.    . Calcium Carbonate-Vitamin D (CALCIUM 600+D PO) Take 1 tablet by mouth at bedtime.    Marland Kitchen. diltiazem (CARDIZEM) 30 MG tablet Take 30 mg by mouth 2 (two) times daily. If heart rate is over 130    . flecainide (TAMBOCOR) 50 MG tablet Take 1 tablet (50 mg total) by mouth 2 (two) times daily. 60 tablet 6  . gabapentin (NEURONTIN) 600 MG tablet TAKE ONE TABLET BY MOUTH AT BEDTIME 90 tablet 3  . hydrALAZINE (APRESOLINE) 25 MG tablet Take one tablet (25 mg) by mouth every 6 hours as needed for a blood pressure >170 on the top number 30 tablet 3  . losartan (COZAAR) 50 MG tablet Take 0.5 tablets (25 mg total) by mouth daily. (Patient taking differently: Take 50 mg  by mouth 2 (two) times daily. ) 1 tablet 0   No current facility-administered medications for this visit.     Allergies  Allergen Reactions  . Cymbalta [Duloxetine Hcl] Nausea Only  . Wellbutrin [Bupropion] Other (See Comments)    Elevated blood pressure, anxiety  . Nicotine Palpitations    Nicotine Patches   . Penicillins Rash    Has patient had a PCN reaction causing immediate rash, facial/tongue/throat swelling, SOB or lightheadedness with hypotension yes Has patient had a PCN reaction causing severe rash involving mucus membranes or skin necrosis: no Has patient had a PCN reaction that required hospitalization no Has patient had a PCN reaction occurring within the last 10 years: no If all of the above answers are "NO", then may proceed with Cephalosporin use.      Review of Systems negative except from HPI and PMH  Physical Exam BP 128/80 (BP Location: Left Arm, Patient Position: Sitting, Cuff Size: Normal)   Pulse (!) 58   Ht 5\' 3"  (1.6 m)   Wt 148 lb 12 oz (67.5 kg)   BMI 26.35 kg/m  Well developed and  well nourished in no acute distress HENT normal E scleral and icterus clear Neck Supple JVP flat; carotids brisk and full Clear to ausculation  Regular rate and rhythm, no murmurs gallops or rub Soft with active bowel sounds No clubbing cyanosis  Edema Alert and oriented, grossly normal motor and sensory function Skin Warm and Dry  ECG demonstrates A supraventricular rhythm with a rate of about 60. There are no discernible P waves except for one deflection in ST segment suggesting that this represents sinus arrest as opposed to atrial fibrillation as suggested by the computerized reading   Assessment and  Plan  Atrial flutter status post ablation  Palpitations with PVCs and PACs  Hypertension  Anxiety  /depression  Sinus arrest versus atrial fibrillation (see above)   Her exercise intolerance is likely related to her bradycardia. Whether this represents atrial fibrillation which I don't think or sinus arrest which I think more likely for the reasons outlined above, she is on no medications that should be causing this and so we will undertake pacing. The plan would be to place the atrial lead and look for evidence of atrial electrical activity As it turns out that this is atrial fibrillation, we would attempt cardioversion  I've also encouraged her to talk with Dr. Alphonsus SiasLetvak about her depression  For now we will continue the flecainide  The benefits and risks were reviewed including but not limited to death,  perforation, infection, lead dislodgement and device malfunction.  The patient understands agrees and is willing to proceed.    Current medicines are reviewed at length with the patient today.  The patient does not  have concerns regarding medicines.

## 2017-05-28 NOTE — Patient Instructions (Signed)
Medication Instructions: - Your physician recommends that you continue on your current medications as directed. Please refer to the Current Medication list given to you today.  Labwork: - Your physician recommends that you have lab work today: BMP/ CBC/ INR  Procedures/Testing: - Your physician has recommended that you have a pacemaker inserted. A pacemaker is a small device that is placed under the skin of your chest or abdomen to help control abnormal heart rhythms. This device uses electrical pulses to prompt the heart to beat at a normal rate. Pacemakers are used to treat heart rhythms that are too slow. Wire (leads) are attached to the pacemaker that goes into the chambers of you heart. This is done in the hospital and usually requires and overnight stay. Please see the instruction sheet given to you today for more information.  Follow-Up: - Your physician recommends that you schedule a follow-up appointment in: about 14 days (from 06/03/17) with the Device Clinic nurse for a wound check  - Your physician recommends that you schedule a follow-up appointment in: 91 days (from 06/03/17) with Dr. Graciela HusbandsKlein.    Any Additional Special Instructions Will Be Listed Below (If Applicable).     If you need a refill on your cardiac medications before your next appointment, please call your pharmacy.

## 2017-05-29 ENCOUNTER — Ambulatory Visit (INDEPENDENT_AMBULATORY_CARE_PROVIDER_SITE_OTHER): Payer: Medicare Other | Admitting: Internal Medicine

## 2017-05-29 ENCOUNTER — Encounter: Payer: Self-pay | Admitting: Internal Medicine

## 2017-05-29 DIAGNOSIS — F39 Unspecified mood [affective] disorder: Secondary | ICD-10-CM | POA: Diagnosis not present

## 2017-05-29 DIAGNOSIS — I480 Paroxysmal atrial fibrillation: Secondary | ICD-10-CM

## 2017-05-29 LAB — CBC WITH DIFFERENTIAL/PLATELET
Basophils Absolute: 0 10*3/uL (ref 0.0–0.2)
Basos: 0 %
EOS (ABSOLUTE): 0.1 10*3/uL (ref 0.0–0.4)
Eos: 1 %
Hematocrit: 47.7 % — ABNORMAL HIGH (ref 34.0–46.6)
Hemoglobin: 15.8 g/dL (ref 11.1–15.9)
Immature Grans (Abs): 0 10*3/uL (ref 0.0–0.1)
Immature Granulocytes: 0 %
Lymphocytes Absolute: 2.5 10*3/uL (ref 0.7–3.1)
Lymphs: 40 %
MCH: 31.2 pg (ref 26.6–33.0)
MCHC: 33.1 g/dL (ref 31.5–35.7)
MCV: 94 fL (ref 79–97)
Monocytes Absolute: 0.5 10*3/uL (ref 0.1–0.9)
Monocytes: 7 %
Neutrophils Absolute: 3.2 10*3/uL (ref 1.4–7.0)
Neutrophils: 52 %
Platelets: 200 10*3/uL (ref 150–379)
RBC: 5.07 x10E6/uL (ref 3.77–5.28)
RDW: 13.3 % (ref 12.3–15.4)
WBC: 6.2 10*3/uL (ref 3.4–10.8)

## 2017-05-29 LAB — BASIC METABOLIC PANEL
BUN/Creatinine Ratio: 23 (ref 12–28)
BUN: 14 mg/dL (ref 8–27)
CO2: 23 mmol/L (ref 20–29)
Calcium: 9.3 mg/dL (ref 8.7–10.3)
Chloride: 106 mmol/L (ref 96–106)
Creatinine, Ser: 0.6 mg/dL (ref 0.57–1.00)
GFR calc Af Amer: 103 mL/min/{1.73_m2} (ref >59)
GFR calc non Af Amer: 89 mL/min/{1.73_m2} (ref >59)
Glucose: 82 mg/dL (ref 65–99)
Potassium: 4.5 mmol/L (ref 3.5–5.2)
Sodium: 143 mmol/L (ref 134–144)

## 2017-05-29 LAB — PROTIME-INR
INR: 1.1 (ref 0.8–1.2)
Prothrombin Time: 10.9 s (ref 9.1–12.0)

## 2017-05-29 MED ORDER — DULOXETINE HCL 30 MG PO CPEP: 30.0000 mg | ORAL_CAPSULE | Freq: Every day | ORAL | 3 refills | Status: DC

## 2017-05-29 MED ORDER — MIRTAZAPINE 15 MG PO TABS: 15.0000 mg | ORAL_TABLET | Freq: Every day | ORAL | 1 refills | Status: DC

## 2017-05-29 MED ORDER — DILTIAZEM HCL 30 MG PO TABS: 30.0000 mg | ORAL_TABLET | Freq: Two times a day (BID) | ORAL | 11 refills | Status: DC

## 2017-05-29 NOTE — Progress Notes (Signed)
Subjective:    Patient ID: Heidi Carroll, female    DOB: 20-Dec-1940, 76 y.o.   MRN: [id number]  HPI Here after ER visit Had symptomatic bradycardia--- rate 34-36 for at least 2 hours Now scheduled for pacemaker with Dr Graciela HusbandsKlein next week  Her nerves are very bad Next door neighbor she was helping--just died Feels "back where I started from"  Tried sertraline--caused nausea, etc Using the alprazolam does help--but only lasts 3 hours "I just have to figure out what is going on---this is not like me" Feels better physically than mentally  Some depression--but feels it is mostly her nerves Wonders why she is here and others aren't--- no SI  Current Outpatient Prescriptions on File Prior to Visit  Medication Sig Dispense Refill  . acetaminophen (TYLENOL) 500 MG tablet Take 500 mg by mouth daily as needed for moderate pain or headache.    . ALPRAZolam (XANAX) 0.25 MG tablet Take 0.25 mg by mouth 2 (two) times daily.     Marland Kitchen. aspirin EC 81 MG tablet Take 81 mg by mouth every evening.     . Calcium Carbonate-Vitamin D (CALCIUM 600+D PO) Take 1 tablet by mouth every evening.     . diltiazem (CARDIZEM) 30 MG tablet Take 30 mg by mouth 2 (two) times daily.     . flecainide (TAMBOCOR) 50 MG tablet Take 1 tablet (50 mg total) by mouth 2 (two) times daily. 60 tablet 6  . gabapentin (NEURONTIN) 600 MG tablet TAKE ONE TABLET BY MOUTH AT BEDTIME 90 tablet 3  . hydrALAZINE (APRESOLINE) 25 MG tablet Take one tablet (25 mg) by mouth every 6 hours as needed for a blood pressure >170 on the top number 30 tablet 3  . losartan (COZAAR) 50 MG tablet Take 0.5 tablets (25 mg total) by mouth daily. 1 tablet 0   No current facility-administered medications on file prior to visit.     Allergies  Allergen Reactions  . Cymbalta [Duloxetine Hcl] Nausea Only  . Wellbutrin [Bupropion] Other (See Comments)    Elevated blood pressure, anxiety  . Zoloft [Sertraline Hcl] Nausea Only    Severe weight loss   .  Nicotine Palpitations    Nicotine Patches   . Penicillins Rash    Has patient had a PCN reaction causing immediate rash, facial/tongue/throat swelling, SOB or lightheadedness with hypotension yes Has patient had a PCN reaction causing severe rash involving mucus membranes or skin necrosis: no Has patient had a PCN reaction that required hospitalization no Has patient had a PCN reaction occurring within the last 10 years: no If all of the above answers are "NO", then may proceed with Cephalosporin use.    Past Medical History:  Diagnosis Date  . A-fib (HCC)   . Depression   . Neuropathy    idiopathic    Past Surgical History:  Procedure Laterality Date  . A-FLUTTER ABLATION N/A 02/04/2017   Procedure: A-Flutter Ablation;  Surgeon: Duke SalviaSteven C Klein, MD;  [facility];  Service: Cardiovascular;  Laterality: N/A;  . FOOT SURGERY  1995   bilateral   . SPINAL CORD STIMULATOR IMPLANT  12/1996   didn't keep  . VAGINAL DELIVERY  1963   X 1  . VAGINAL HYSTERECTOMY  1974    Family History  Problem Relation Age of Onset  . Cancer Father        colon  . Heart attack Brother   . Diabetes Maternal Grandfather     Social History  Social History  . Marital status: Widowed    Spouse name: N/A  . Number of children: 1  . Years of education: N/A   Occupational History  . Retired, [employer]([place])    Social History Main Topics  . Smoking status: Current Some Day Smoker    Packs/day: 0.25    Types: Cigarettes  . Smokeless tobacco: Never Used     Comment: 2-3 a day  . Alcohol use No  . Drug use: No  . Sexual activity: Not on file   Other Topics Concern  . Not on file   Social History Narrative   Husband died 10/14      Has living will   Has health care POA--- [name], friend   Requests DNR--- form done 12/22/14   No tube feedings if cognitively unaware   Review of Systems Not clear what her next outlet will be Not eating well Sleeping only  about 4 hours a night    Objective:   Physical Exam  Constitutional: No distress.  Cardiovascular: Exam reveals no gallop.   No murmur heard. Slow and irregular  Pulmonary/Chest: Effort normal and breath sounds normal. No respiratory distress. She has no wheezes. She has no rales.  Musculoskeletal: She exhibits no edema.  Psychiatric:  Mild depressed mood Appropriate affect, dress, speech          Assessment & Plan:

## 2017-05-29 NOTE — Telephone Encounter (Signed)
Last filled 04-22-17 #60 Last OV 05-01-17 Next OV Today

## 2017-05-29 NOTE — Assessment & Plan Note (Addendum)
Worse again  Thinking about her husband again after her neighbor died (and she was going to be her "purpose") Asked her to call hospice for bereavement counselor Will try mirtazapine

## 2017-05-29 NOTE — Patient Instructions (Signed)
Please call hospice [phone] to speak to a bereavement counselor.

## 2017-05-29 NOTE — Assessment & Plan Note (Signed)
And ?SSS Getting pacer next week

## 2017-05-29 NOTE — Telephone Encounter (Signed)
Approved: #[phone] x 0 

## 2017-05-29 NOTE — Telephone Encounter (Signed)
Left refill on voice mail at pharmacy  

## 2017-06-03 ENCOUNTER — Ambulatory Visit (HOSPITAL_COMMUNITY)
Admission: RE | Admit: 2017-06-03 | Discharge: 2017-06-04 | Disposition: A | Discharge status: Home / Self Care | Payer: Medicare Other | Source: Ambulatory Visit | Attending: Internal Medicine | Admitting: Internal Medicine

## 2017-06-03 ENCOUNTER — Encounter (HOSPITAL_COMMUNITY)
Admission: RE | Disposition: A | Discharge status: Home / Self Care | Payer: Self-pay | Source: Ambulatory Visit | Attending: Internal Medicine

## 2017-06-03 ENCOUNTER — Encounter (HOSPITAL_COMMUNITY): Payer: Self-pay | Admitting: General Practice

## 2017-06-03 DIAGNOSIS — I4891 Unspecified atrial fibrillation: Secondary | ICD-10-CM | POA: Insufficient documentation

## 2017-06-03 DIAGNOSIS — R001 Bradycardia, unspecified: Secondary | ICD-10-CM

## 2017-06-03 DIAGNOSIS — Z880 Allergy status to penicillin: Secondary | ICD-10-CM | POA: Insufficient documentation

## 2017-06-03 DIAGNOSIS — Z7982 Long term (current) use of aspirin: Secondary | ICD-10-CM | POA: Diagnosis not present

## 2017-06-03 DIAGNOSIS — I471 Supraventricular tachycardia: Secondary | ICD-10-CM | POA: Diagnosis not present

## 2017-06-03 DIAGNOSIS — I493 Ventricular premature depolarization: Secondary | ICD-10-CM | POA: Diagnosis not present

## 2017-06-03 DIAGNOSIS — F419 Anxiety disorder, unspecified: Secondary | ICD-10-CM | POA: Diagnosis not present

## 2017-06-03 DIAGNOSIS — I10 Essential (primary) hypertension: Secondary | ICD-10-CM | POA: Insufficient documentation

## 2017-06-03 DIAGNOSIS — F329 Major depressive disorder, single episode, unspecified: Secondary | ICD-10-CM | POA: Insufficient documentation

## 2017-06-03 DIAGNOSIS — I495 Sick sinus syndrome: Secondary | ICD-10-CM | POA: Insufficient documentation

## 2017-06-03 DIAGNOSIS — G609 Hereditary and idiopathic neuropathy, unspecified: Secondary | ICD-10-CM | POA: Diagnosis not present

## 2017-06-03 DIAGNOSIS — Z959 Presence of cardiac and vascular implant and graft, unspecified: Secondary | ICD-10-CM

## 2017-06-03 DIAGNOSIS — I700 Atherosclerosis of aorta: Secondary | ICD-10-CM | POA: Insufficient documentation

## 2017-06-03 HISTORY — PX: PACEMAKER IMPLANT: EP1218

## 2017-06-03 HISTORY — PX: INSERT / REPLACE / REMOVE PACEMAKER: SUR710

## 2017-06-03 HISTORY — DX: Presence of cardiac pacemaker: Z95.0

## 2017-06-03 HISTORY — DX: Essential (primary) hypertension: I10

## 2017-06-03 LAB — SURGICAL PCR SCREEN
MRSA, PCR: NEGATIVE
STAPHYLOCOCCUS AUREUS: NEGATIVE

## 2017-06-03 SURGERY — PACEMAKER IMPLANT

## 2017-06-03 MED ORDER — OXYCODONE HCL 5 MG PO TABS: 5.0000 mg | ORAL_TABLET | Freq: Once | ORAL | Status: DC | PRN

## 2017-06-03 MED ORDER — MIRTAZAPINE 15 MG PO TABS
15.0000 mg | ORAL_TABLET | Freq: Every day | ORAL | Status: DC
  Administered 2017-06-03: 21:00:00 15 mg via ORAL
  Filled 2017-06-03: qty 1

## 2017-06-03 MED ORDER — FENTANYL CITRATE (PF) 100 MCG/2ML IJ SOLN
INTRAMUSCULAR | Status: AC
  Filled 2017-06-03: qty 2

## 2017-06-03 MED ORDER — FENTANYL CITRATE (PF) 100 MCG/2ML IJ SOLN
INTRAMUSCULAR | Status: DC | PRN
  Administered 2017-06-03: 25 ug via INTRAVENOUS

## 2017-06-03 MED ORDER — DILTIAZEM HCL 30 MG PO TABS
30.0000 mg | ORAL_TABLET | Freq: Two times a day (BID) | ORAL | Status: DC
  Administered 2017-06-03 – 2017-06-04 (×2): 30 mg via ORAL
  Filled 2017-06-03 (×2): qty 1

## 2017-06-03 MED ORDER — GABAPENTIN 600 MG PO TABS
600.0000 mg | ORAL_TABLET | Freq: Every day | ORAL | Status: DC
  Administered 2017-06-03: 600 mg via ORAL
  Filled 2017-06-03: qty 1

## 2017-06-03 MED ORDER — HEPARIN (PORCINE) IN NACL 2-0.9 UNIT/ML-% IJ SOLN
INTRAMUSCULAR | Status: AC
  Filled 2017-06-03: qty 500

## 2017-06-03 MED ORDER — HEPARIN (PORCINE) IN NACL 2-0.9 UNIT/ML-% IJ SOLN
INTRAMUSCULAR | Status: AC | PRN
  Administered 2017-06-03: 500 mL

## 2017-06-03 MED ORDER — VANCOMYCIN HCL IN DEXTROSE 1-5 GM/200ML-% IV SOLN
INTRAVENOUS | Status: AC
  Filled 2017-06-03: qty 200

## 2017-06-03 MED ORDER — ENSURE ENLIVE PO LIQD
237.0000 mL | Freq: Two times a day (BID) | ORAL | Status: DC
  Filled 2017-06-03 (×4): qty 237

## 2017-06-03 MED ORDER — SODIUM CHLORIDE 0.9 % IV SOLN
INTRAVENOUS | Status: DC
  Administered 2017-06-03: 11:00:00 via INTRAVENOUS

## 2017-06-03 MED ORDER — VANCOMYCIN HCL IN DEXTROSE 1-5 GM/200ML-% IV SOLN
1000.0000 mg | Freq: Two times a day (BID) | INTRAVENOUS | Status: AC
  Administered 2017-06-04: 04:00:00 1000 mg via INTRAVENOUS
  Filled 2017-06-03: qty 200

## 2017-06-03 MED ORDER — LIDOCAINE HCL (PF) 1 % IJ SOLN
INTRAMUSCULAR | Status: AC
  Filled 2017-06-03: qty 60

## 2017-06-03 MED ORDER — ASPIRIN EC 81 MG PO TBEC
81.0000 mg | DELAYED_RELEASE_TABLET | Freq: Every evening | ORAL | Status: DC
  Administered 2017-06-04: 81 mg via ORAL
  Filled 2017-06-03: qty 1

## 2017-06-03 MED ORDER — SODIUM CHLORIDE 0.9 % IV SOLN: INTRAVENOUS | Status: AC

## 2017-06-03 MED ORDER — CHLORHEXIDINE GLUCONATE 4 % EX LIQD
60.0000 mL | Freq: Once | CUTANEOUS | Status: DC
  Filled 2017-06-03: qty 60

## 2017-06-03 MED ORDER — IOHEXOL 350 MG/ML SOLN
INTRAVENOUS | Status: DC | PRN
  Administered 2017-06-03: 15 mL via INTRAVENOUS

## 2017-06-03 MED ORDER — MIDAZOLAM HCL 5 MG/5ML IJ SOLN
INTRAMUSCULAR | Status: DC | PRN
  Administered 2017-06-03: 1 mg via INTRAVENOUS

## 2017-06-03 MED ORDER — SODIUM CHLORIDE 0.9 % IR SOLN
Status: AC
  Filled 2017-06-03: qty 2

## 2017-06-03 MED ORDER — VANCOMYCIN HCL IN DEXTROSE 1-5 GM/200ML-% IV SOLN
1000.0000 mg | INTRAVENOUS | Status: AC
  Administered 2017-06-03: 1000 mg via INTRAVENOUS

## 2017-06-03 MED ORDER — SODIUM CHLORIDE 0.9 % IR SOLN
80.0000 mg | Status: AC
  Administered 2017-06-03: 80 mg

## 2017-06-03 MED ORDER — ACETAMINOPHEN 325 MG PO TABS
325.0000 mg | ORAL_TABLET | ORAL | Status: DC | PRN
  Administered 2017-06-03: 650 mg via ORAL
  Filled 2017-06-03: qty 2

## 2017-06-03 MED ORDER — LIDOCAINE HCL (PF) 1 % IJ SOLN
INTRAMUSCULAR | Status: DC | PRN
  Administered 2017-06-03: 45 mL via INTRADERMAL

## 2017-06-03 MED ORDER — MUPIROCIN 2 % EX OINT
TOPICAL_OINTMENT | CUTANEOUS | Status: AC
  Administered 2017-06-03: 1 via TOPICAL
  Filled 2017-06-03: qty 22

## 2017-06-03 MED ORDER — FLECAINIDE ACETATE 50 MG PO TABS
50.0000 mg | ORAL_TABLET | Freq: Two times a day (BID) | ORAL | Status: DC
  Administered 2017-06-03 – 2017-06-04 (×2): 50 mg via ORAL
  Filled 2017-06-03 (×2): qty 1

## 2017-06-03 MED ORDER — MIDAZOLAM HCL 5 MG/5ML IJ SOLN
INTRAMUSCULAR | Status: AC
  Filled 2017-06-03: qty 5

## 2017-06-03 MED ORDER — TRAMADOL HCL 50 MG PO TABS
50.0000 mg | ORAL_TABLET | Freq: Four times a day (QID) | ORAL | Status: DC | PRN
  Administered 2017-06-03: 50 mg via ORAL
  Filled 2017-06-03: qty 1

## 2017-06-03 MED ORDER — YOU HAVE A PACEMAKER BOOK
Freq: Once | Status: AC
  Administered 2017-06-03: 21:00:00
  Filled 2017-06-03: qty 1

## 2017-06-03 MED ORDER — LOSARTAN POTASSIUM 50 MG PO TABS
25.0000 mg | ORAL_TABLET | Freq: Every day | ORAL | Status: DC
  Administered 2017-06-04: 25 mg via ORAL
  Filled 2017-06-03: qty 1

## 2017-06-03 MED ORDER — ONDANSETRON HCL 4 MG/2ML IJ SOLN: 4.0000 mg | Freq: Four times a day (QID) | INTRAMUSCULAR | Status: DC | PRN

## 2017-06-03 MED ORDER — ACETAMINOPHEN 500 MG PO TABS: 500.0000 mg | ORAL_TABLET | Freq: Every day | ORAL | Status: DC | PRN

## 2017-06-03 MED ORDER — MUPIROCIN 2 % EX OINT
1.0000 "application " | TOPICAL_OINTMENT | Freq: Once | CUTANEOUS | Status: AC
  Administered 2017-06-03: 1 via TOPICAL

## 2017-06-03 SURGICAL SUPPLY — 13 items
CABLE SURGICAL S-101-97-12 (CABLE) ×3 IMPLANT
CATH RIGHTSITE C315HIS02 (CATHETERS) ×3 IMPLANT
HEMOSTAT SURGICEL 2X4 FIBR (HEMOSTASIS) ×3 IMPLANT
IPG PACE AZUR XT DR MRI W1DR01 (Pacemaker) ×1 IMPLANT
LEAD CAPSURE NOVUS 5076-52CM (Lead) ×3 IMPLANT
LEAD SELECT SECURE 3830 383069 (Lead) ×1 IMPLANT
PACE AZURE XT DR MRI W1DR01 (Pacemaker) ×3 IMPLANT
PAD DEFIB LIFELINK (PAD) ×3 IMPLANT
SELECT SECURE 3830 383069 (Lead) ×3 IMPLANT
SHEATH CLASSIC 7F (SHEATH) ×6 IMPLANT
SLITTER 6232ADJ (MISCELLANEOUS) ×3 IMPLANT
TRAY PACEMAKER INSERTION (PACKS) ×3 IMPLANT
WIRE HI TORQ VERSACORE-J 145CM (WIRE) ×3 IMPLANT

## 2017-06-03 NOTE — Discharge Summary (Signed)
ELECTROPHYSIOLOGY PROCEDURE DISCHARGE SUMMARY    Patient ID: Heidi Carroll,  MRN: [id number], DOB/AGE: 1941-10-19 76 y.o.  Admit date: 06/03/2017 Discharge date: 06/04/17  Primary Care Physician: Karie SchwalbeLetvak, Richard I, MD Primary Cardiologist/Electrophysiologist: Dr. Graciela HusbandsKlein  Primary Discharge Diagnosis:  1. Symptomatic bradycardia  Secondary Discharge Diagnosis:  1. PVC's 2. HTN 3. Anxiety/depression  Allergies  Allergen Reactions  . Cymbalta [Duloxetine Hcl] Nausea Only  . Wellbutrin [Bupropion] Other (See Comments)    Elevated blood pressure, anxiety  . Zoloft [Sertraline Hcl] Nausea Only    Severe weight loss   . Nicotine Palpitations    Nicotine Patches   . Penicillins Rash    Has patient had a PCN reaction causing immediate rash, facial/tongue/throat swelling, SOB or lightheadedness with hypotension yes Has patient had a PCN reaction causing severe rash involving mucus membranes or skin necrosis: no Has patient had a PCN reaction that required hospitalization no Has patient had a PCN reaction occurring within the last 10 years: no If all of the above answers are "NO", then may proceed with Cephalosporin use.     Procedures This Admission:  1.  Implantation of a MDT dual chamber PPM on 06/03/17 by Dr. Graciela HusbandsKlein.  The patient received a Medtronic MRI compatible pulse generator serial number [id number]. Medtronic MRI compatible 3830 ventricular lead serial number [id number] and a Medtronic MRI compatible 5076 atrial lead serial number [id number]  There were no immediate post procedure complications. 2.  CXR on 06/04/17 demonstrated no pneumothorax status post device implantation.   Brief HPI: Heidi Carroll is a 76 y.o. female is followed in the outpatient setting found with symptomatic radycardia.  Past medical history includes AFlutter ablated, PAC's,PVCs on Flecainide, HTN, anxiety/depression, unclear observation of questionable AFib vs sinus arrest.  The patient has had  symptomatic bradycardia without reversible causes identified.  Risks, benefits, and alternatives to PPM implantation were reviewed with the patient who wished to proceed.   Hospital Course:  The patient was admitted and underwent implantation of a PPM with details as outlined above.  She was monitored on telemetry overnight which demonstrated A paced V sensed rhythm.  Left chest was without hematoma or ecchymosis.  The device was interrogated and found to be functioning normally.  CXR was obtained and demonstrated no pneumothorax status post device implantation.  Wound care, arm mobility, and restrictions were reviewed with the patient.  The patient was examined by Dr.  Graciela HusbandsKlein and considered stable for discharge to home.    Physical Exam: Vitals:   06/03/17 2000 06/03/17 2025 06/04/17 0410 06/04/17 0800  BP:  (!) 135/47 131/60 (!) 171/70  Pulse: 64 62 61 61  Resp: (!) 24 20 (!) 23 16  Temp:  98 F (36.7 C) 97.6 F (36.4 C) 98 F (36.7 C)  TempSrc:  Oral Oral Oral  SpO2: 93% 93% 93% 96%  Weight:   151 lb 3.8 oz (68.6 kg)   Height:        GEN- The patient is well appearing, alert and oriented x 3 today.   HEENT: normocephalic, atraumatic; sclera clear, conjunctiva pink; hearing intact; oropharynx clear; neck supple, no JVP Lungs- CTA b/l, normal work of breathing.  No wheezes, rales, rhonchi Heart- RRR no murmurs, rubs or gallops, PMI not laterally displaced GI- soft, non-tender, non-distended Extremities- no clubbing, cyanosis, or edema MS- no significant deformity or atrophy Skin- warm and dry, no rash or lesion,left chest without hematoma/ecchymosis Psych- euthymic mood, full affect Neuro- no gross  deficits   Labs:   Lab Results  Component Value Date   WBC 6.2 05/28/2017   HGB 15.8 05/28/2017   HCT 47.7 (H) 05/28/2017   MCV 94 05/28/2017   PLT 200 05/28/2017     Recent Labs Lab 05/28/17 1209  NA 143  K 4.5  CL 106  CO2 23  BUN 14  CREATININE 0.60  CALCIUM 9.3    GLUCOSE 82    Discharge Medications:  Allergies as of 06/04/2017      Reactions   Cymbalta [duloxetine Hcl] Nausea Only   Wellbutrin [bupropion] Other (See Comments)   Elevated blood pressure, anxiety   Zoloft [sertraline Hcl] Nausea Only   Severe weight loss    Nicotine Palpitations   Nicotine Patches   Penicillins Rash   Has patient had a PCN reaction causing immediate rash, facial/tongue/throat swelling, SOB or lightheadedness with hypotension yes Has patient had a PCN reaction causing severe rash involving mucus membranes or skin necrosis: no Has patient had a PCN reaction that required hospitalization no Has patient had a PCN reaction occurring within the last 10 years: no If all of the above answers are "NO", then may proceed with Cephalosporin use.      Medication List    TAKE these medications   acetaminophen 500 MG tablet Commonly known as:  TYLENOL Take 500 mg by mouth daily as needed for moderate pain or headache.   ALPRAZolam 0.25 MG tablet Commonly known as:  XANAX TAKE 1/2 TO 1 (ONE-HALF TO ONE) TABLET BY MOUTH TWICE DAILY AS NEEDED FOR ANXIETY   aspirin EC 81 MG tablet Take 81 mg by mouth every evening.   CALCIUM 600+D PO Take 1 tablet by mouth every evening.   diltiazem 30 MG tablet Commonly known as:  CARDIZEM Take 1 tablet (30 mg total) by mouth 2 (two) times daily.   flecainide 50 MG tablet Commonly known as:  TAMBOCOR Take 1 tablet (50 mg total) by mouth 2 (two) times daily.   gabapentin 600 MG tablet Commonly known as:  NEURONTIN TAKE ONE TABLET BY MOUTH AT BEDTIME   hydrALAZINE 25 MG tablet Commonly known as:  APRESOLINE Take one tablet (25 mg) by mouth every 6 hours as needed for a blood pressure >170 on the top number   losartan 50 MG tablet Commonly known as:  COZAAR Take 0.5 tablets (25 mg total) by mouth daily.   mirtazapine 15 MG tablet Commonly known as:  REMERON Take 1 tablet (15 mg total) by mouth at bedtime.        Disposition: Home Discharge Instructions    Diet - low sodium heart healthy    Complete by:  As directed    Increase activity slowly    Complete by:  As directed      Follow-up Information    [facility] Office Follow up on 06/17/2017.   Specialty:  Cardiology Why:  2:00PM, wound check Contact information: [address], [address] [place] [address] [phone]       Duke Salvia, MD .   Specialty:  Cardiology Contact information: [address] [place] [address] [phone]           Duration of Discharge Encounter: Greater than 30 minutes including physician time.  Norma Fredrickson, PA-C 06/04/2017 9:36 AM

## 2017-06-03 NOTE — H&P (View-Only) (Signed)
Patient Care Team: Karie SchwalbeLetvak, Richard I, MD as PCP - General (Internal Medicine) Pleasant, Dennard SchaumannFrances H, RN as [facility] Care Management   HPI  Heidi Carroll is a 76 y.o. female Seen in follow-up for atrial flutter for which she underwent catheter ablation 4/18.   Echocardiogram 1/18 EF normal 55-65%.  She has had multiple palpitations since then and underwent Holter monitoring>>> PVC                       Non sustained atrial tachycardia  180 bpm but only 4 beats                       There is some suggestion of atrial tach, not fast about 110                        She has had multiple trips to the emergency room because of flushing in the back of her head which is associated with hypertension. We started her on flecainide for her palpitations. These are somewhat improved. She feels like she is going crazy.  DATE PR interval QRSduration Dose-Flecainide  5/18  132 90 0  8/18 NA 88 50   She was seen in the emergency room because of symptomatic "bradycardia ". Characterized by fatigue and shortness of breath.  Her  rate was 58 but there was coupling so taht effective HR was about 34.  No p waves were seen, except for one deflection on her ECG in ER ( as well as todays)    Records and Results Reviewed ER notes troponin and renal function are all normal   Past Medical History:  Diagnosis Date  . A-fib (HCC)   . Depression   . Neuropathy    idiopathic    Past Surgical History:  Procedure Laterality Date  . A-FLUTTER ABLATION N/A 02/04/2017   Procedure: A-Flutter Ablation;  Surgeon: Duke SalviaSteven C Haileigh Pitz, MD;  [facility];  Service: Cardiovascular;  Laterality: N/A;  . FOOT SURGERY  1995   bilateral   . SPINAL CORD STIMULATOR IMPLANT  12/1996   didn't keep  . VAGINAL DELIVERY  1963   X 1  . VAGINAL HYSTERECTOMY  1974    Current Outpatient Prescriptions  Medication Sig Dispense Refill  . acetaminophen (TYLENOL) 500 MG tablet Take 500 mg by  mouth daily as needed for moderate pain or headache.    . ALPRAZolam (XANAX) 0.25 MG tablet Take 0.125-0.25 mg by mouth 2 (two) times daily as needed for anxiety.     Marland Kitchen. aspirin EC 81 MG tablet Take 81 mg by mouth daily.    . Calcium Carbonate-Vitamin D (CALCIUM 600+D PO) Take 1 tablet by mouth at bedtime.    Marland Kitchen. diltiazem (CARDIZEM) 30 MG tablet Take 30 mg by mouth 2 (two) times daily. If heart rate is over 130    . flecainide (TAMBOCOR) 50 MG tablet Take 1 tablet (50 mg total) by mouth 2 (two) times daily. 60 tablet 6  . gabapentin (NEURONTIN) 600 MG tablet TAKE ONE TABLET BY MOUTH AT BEDTIME 90 tablet 3  . hydrALAZINE (APRESOLINE) 25 MG tablet Take one tablet (25 mg) by mouth every 6 hours as needed for a blood pressure >170 on the top number 30 tablet 3  . losartan (COZAAR) 50 MG tablet Take 0.5 tablets (25 mg total) by mouth daily. (Patient taking differently: Take 50 mg  by mouth 2 (two) times daily. ) 1 tablet 0   No current facility-administered medications for this visit.     Allergies  Allergen Reactions  . Cymbalta [Duloxetine Hcl] Nausea Only  . Wellbutrin [Bupropion] Other (See Comments)    Elevated blood pressure, anxiety  . Nicotine Palpitations    Nicotine Patches   . Penicillins Rash    Has patient had a PCN reaction causing immediate rash, facial/tongue/throat swelling, SOB or lightheadedness with hypotension yes Has patient had a PCN reaction causing severe rash involving mucus membranes or skin necrosis: no Has patient had a PCN reaction that required hospitalization no Has patient had a PCN reaction occurring within the last 10 years: no If all of the above answers are "NO", then may proceed with Cephalosporin use.      Review of Systems negative except from HPI and PMH  Physical Exam BP 128/80 (BP Location: Left Arm, Patient Position: Sitting, Cuff Size: Normal)   Pulse (!) 58   Ht 5\' 3"  (1.6 m)   Wt 148 lb 12 oz (67.5 kg)   BMI 26.35 kg/m  Well developed and  well nourished in no acute distress HENT normal E scleral and icterus clear Neck Supple JVP flat; carotids brisk and full Clear to ausculation  Regular rate and rhythm, no murmurs gallops or rub Soft with active bowel sounds No clubbing cyanosis  Edema Alert and oriented, grossly normal motor and sensory function Skin Warm and Dry  ECG demonstrates A supraventricular rhythm with a rate of about 60. There are no discernible P waves except for one deflection in ST segment suggesting that this represents sinus arrest as opposed to atrial fibrillation as suggested by the computerized reading   Assessment and  Plan  Atrial flutter status post ablation  Palpitations with PVCs and PACs  Hypertension  Anxiety  /depression  Sinus arrest versus atrial fibrillation (see above)   Her exercise intolerance is likely related to her bradycardia. Whether this represents atrial fibrillation which I don't think or sinus arrest which I think more likely for the reasons outlined above, she is on no medications that should be causing this and so we will undertake pacing. The plan would be to place the atrial lead and look for evidence of atrial electrical activity As it turns out that this is atrial fibrillation, we would attempt cardioversion  I've also encouraged her to talk with Dr. Alphonsus SiasLetvak about her depression  For now we will continue the flecainide  The benefits and risks were reviewed including but not limited to death,  perforation, infection, lead dislodgement and device malfunction.  The patient understands agrees and is willing to proceed.    Current medicines are reviewed at length with the patient today.  The patient does not  have concerns regarding medicines.

## 2017-06-03 NOTE — Interval H&P Note (Signed)
History and Physical Interval Note:  06/03/2017 12:26 PM  Heidi Carroll  has presented today for surgery, with the diagnosis of bradicardia  The various methods of treatment have been discussed with the patient and family. After consideration of risks, benefits and other options for treatment, the patient has consented to  Procedure(s): Pacemaker Implant (N/A) as a surgical intervention .  The patient's history has been reviewed, patient examined, no change in status, stable for surgery.  I have reviewed the patient's chart and labs.  Questions were answered to the patient's satisfaction.     Sherryl MangesSteven Loyed Wilmes

## 2017-06-03 NOTE — Discharge Instructions (Signed)
° ° °  Supplemental Discharge Instructions for  °Pacemaker/Defibrillator Patients ° °Activity °No heavy lifting or vigorous activity with your left/right arm for 6 to 8 weeks.  Do not raise your left/right arm above your head for one week.  Gradually raise your affected arm as drawn below. ° °        °  06/07/17                     06/08/17                     06/09/17                   06/10/17 °__ ° °NO DRIVING for 1 week ; you may begin driving on  06/10/17  . ° °WOUND CARE °- Keep the wound area clean and dry.  Do not get this area wet,no showers for 24 hours; you may shower on 06/04/17 evening  . °- The tape/steri-strips on your wound will fall off; do not pull them off.  No bandage is needed on the site.  DO  NOT apply any creams, oils, or ointments to the wound area. °- If you notice any drainage or discharge from the wound, any swelling or bruising at the site, or you develop a fever > 101? F after you are discharged home, call the office at once. ° °Special Instructions °- You are still able to use cellular telephones; use the ear opposite the side where you have your pacemaker/defibrillator.  Avoid carrying your cellular phone near your device. °- When traveling through airports, show security personnel your identification card to avoid being screened in the metal detectors.  Ask the security personnel to use the hand wand. °- Avoid arc welding equipment, MRI testing (magnetic resonance imaging), TENS units (transcutaneous nerve stimulators).  Call the office for questions about other devices. °- Avoid electrical appliances that are in poor condition or are not properly grounded. °- Microwave ovens are safe to be near or to operate. ° °Additional information for defibrillator patients should your device go off: °- If your device goes off ONCE and you feel fine afterward, notify the device clinic nurses. °- If your device goes off ONCE and you do not feel well afterward, call 911. °- If your device goes off TWICE,  call 911. °- If your device goes off THREE times in one day, call 911. ° °DO NOT DRIVE YOURSELF OR A FAMILY MEMBER °WITH A DEFIBRILLATOR TO THE HOSPITAL--CALL 911. ° °

## 2017-06-03 NOTE — Interval H&P Note (Signed)
History and Physical Interval Note:  06/03/2017 2:17 PM  Heidi Carroll  has presented today for surgery, with the diagnosis of bradicardia  The various methods of treatment have been discussed with the patient and family. After consideration of risks, benefits and other options for treatment, the patient has consented to  Procedure(s): Pacemaker Implant (N/A) as a surgical intervention .  The patient's history has been reviewed, patient examined, no change in status, stable for surgery.  I have reviewed the patient's chart and labs.  Questions were answered to the patient's satisfaction.     Sherryl MangesSteven Klein

## 2017-06-03 NOTE — Care Management Note (Addendum)
Case Management Note  Patient Details  Name: Heidi Carroll MRN: [id number] Date of Birth: [birth year]  Subjective/Objective:  From home alone, pta indep, neighbors will be checking on her.  Presents with symptomatic bradycardia, s/p pacemaker implantation. She has PCP and medication coverage.                 Action/Plan: NCM will follow for dc needs.   Expected Discharge Date:                  Expected Discharge Plan:     In-House Referral:     Discharge planning Services  CM Consult  Post Acute Care Choice:    Choice offered to:     DME Arranged:    DME Agency:     HH Arranged:    HH Agency:     Status of Service:  In process, will continue to follow  If discussed at Long Length of Stay Meetings, dates discussed:    Additional Comments:  Leone Havenaylor, Danell Verno Clinton, RN 06/03/2017, 9:19 PM

## 2017-06-04 ENCOUNTER — Ambulatory Visit (HOSPITAL_COMMUNITY): Payer: Medicare Other

## 2017-06-04 ENCOUNTER — Encounter (HOSPITAL_COMMUNITY): Payer: Self-pay | Admitting: Internal Medicine

## 2017-06-04 DIAGNOSIS — F419 Anxiety disorder, unspecified: Secondary | ICD-10-CM | POA: Diagnosis not present

## 2017-06-04 DIAGNOSIS — I499 Cardiac arrhythmia, unspecified: Secondary | ICD-10-CM | POA: Diagnosis not present

## 2017-06-04 DIAGNOSIS — G609 Hereditary and idiopathic neuropathy, unspecified: Secondary | ICD-10-CM | POA: Diagnosis not present

## 2017-06-04 DIAGNOSIS — I4891 Unspecified atrial fibrillation: Secondary | ICD-10-CM | POA: Diagnosis not present

## 2017-06-04 DIAGNOSIS — R001 Bradycardia, unspecified: Secondary | ICD-10-CM | POA: Diagnosis not present

## 2017-06-04 DIAGNOSIS — I493 Ventricular premature depolarization: Secondary | ICD-10-CM | POA: Diagnosis not present

## 2017-06-04 DIAGNOSIS — I10 Essential (primary) hypertension: Secondary | ICD-10-CM | POA: Diagnosis not present

## 2017-06-04 DIAGNOSIS — I471 Supraventricular tachycardia: Secondary | ICD-10-CM | POA: Diagnosis not present

## 2017-06-04 DIAGNOSIS — I495 Sick sinus syndrome: Secondary | ICD-10-CM | POA: Diagnosis not present

## 2017-06-04 DIAGNOSIS — F329 Major depressive disorder, single episode, unspecified: Secondary | ICD-10-CM | POA: Diagnosis not present

## 2017-06-04 DIAGNOSIS — Z880 Allergy status to penicillin: Secondary | ICD-10-CM | POA: Diagnosis not present

## 2017-06-04 DIAGNOSIS — I491 Atrial premature depolarization: Secondary | ICD-10-CM | POA: Diagnosis not present

## 2017-06-04 DIAGNOSIS — Z888 Allergy status to other drugs, medicaments and biological substances status: Secondary | ICD-10-CM | POA: Diagnosis not present

## 2017-06-04 DIAGNOSIS — R002 Palpitations: Secondary | ICD-10-CM | POA: Diagnosis not present

## 2017-06-04 NOTE — Progress Notes (Signed)
Pt being discharged home via wheelchair with family. Pt alert and oriented x4. VSS. Pt c/o no pain at this time. No signs of respiratory distress. Education complete and care plans resolved. IV removed with catheter intact and pt tolerated well. No further issues at this time. Pt to follow up with PCP. Imogine Carvell R, RN 

## 2017-06-10 ENCOUNTER — Other Ambulatory Visit: Payer: Self-pay

## 2017-06-10 NOTE — Patient Outreach (Signed)
[facility] Care Management  06/10/2017  Larena SoxSallye K Cragun 07-13-1941 [id number]   Patient has called after first call and letter sent.  Will proceed with case closure.  Will notify care management assistant of case closure.  Bary Lericheionne J Quoc Tome, RN, MSN [facility] Care Management RN Telephonic Health Coach [phone]

## 2017-06-17 ENCOUNTER — Ambulatory Visit (INDEPENDENT_AMBULATORY_CARE_PROVIDER_SITE_OTHER): Payer: Medicare Other | Admitting: Internal Medicine

## 2017-06-17 ENCOUNTER — Encounter: Payer: Self-pay | Admitting: Internal Medicine

## 2017-06-17 ENCOUNTER — Ambulatory Visit (INDEPENDENT_AMBULATORY_CARE_PROVIDER_SITE_OTHER): Payer: Medicare Other | Admitting: *Deleted

## 2017-06-17 DIAGNOSIS — F39 Unspecified mood [affective] disorder: Secondary | ICD-10-CM | POA: Diagnosis not present

## 2017-06-17 DIAGNOSIS — R001 Bradycardia, unspecified: Secondary | ICD-10-CM

## 2017-06-17 LAB — CUP PACEART INCLINIC DEVICE CHECK
Brady Statistic AP VP Percent: 0.05 %
Brady Statistic AP VS Percent: 89.07 %
Brady Statistic AS VP Percent: 0.02 %
Brady Statistic RA Percent Paced: 89.17 %
Brady Statistic RV Percent Paced: 0.07 %
Implantable Lead Implant Date: 20180808
Implantable Lead Location: 753859
Implantable Lead Location: 753860
Implantable Pulse Generator Implant Date: 20180808
Lead Channel Impedance Value: 399 Ohm
Lead Channel Impedance Value: 589 Ohm
Lead Channel Pacing Threshold Pulse Width: 0.4 ms
Lead Channel Sensing Intrinsic Amplitude: 6.75 mV
Lead Channel Sensing Intrinsic Amplitude: 7.125 mV
Lead Channel Setting Pacing Amplitude: 3.5 V
Lead Channel Setting Pacing Pulse Width: 0.4 ms
Lead Channel Setting Sensing Sensitivity: 0.9 mV
MDC IDC LEAD IMPLANT DT: 20180808
MDC IDC MSMT BATTERY REMAINING LONGEVITY: 146 mo
MDC IDC MSMT BATTERY VOLTAGE: 3.21 V
MDC IDC MSMT LEADCHNL RA IMPEDANCE VALUE: 304 Ohm
MDC IDC MSMT LEADCHNL RA IMPEDANCE VALUE: 456 Ohm
MDC IDC MSMT LEADCHNL RA PACING THRESHOLD AMPLITUDE: 1 V
MDC IDC MSMT LEADCHNL RA SENSING INTR AMPL: 1.25 mV
MDC IDC MSMT LEADCHNL RA SENSING INTR AMPL: 2.875 mV
MDC IDC MSMT LEADCHNL RV PACING THRESHOLD AMPLITUDE: 0.75 V
MDC IDC MSMT LEADCHNL RV PACING THRESHOLD PULSEWIDTH: 0.4 ms
MDC IDC SESS DTM: 20180822141820
MDC IDC SET LEADCHNL RA PACING AMPLITUDE: 3.5 V
MDC IDC STAT BRADY AS VS PERCENT: 10.87 %

## 2017-06-17 NOTE — Progress Notes (Signed)
Wound check appointment. Dermabond removed. Wound without redness or edema. Incision edges approximated, wound well healed.  Underlying rhythm: V escape with retrograde conduction which then appears to conduct through the AV node. Normal device function. Thresholds, sensing, and impedances consistent with implant measurements. Device programmed at 3.5V for extra safety margin until 3 month visit. Histogram distribution appropriate for patient and level of activity. No mode switches or high ventricular rates noted. Patient educated about wound care, arm mobility, lifting restrictions and Carelink monitoring. ROV with SK/B 09/08/17.

## 2017-06-17 NOTE — Patient Instructions (Signed)
Please try miralax--- 1/2 -1 capful with a full glass of water daily.

## 2017-06-17 NOTE — Progress Notes (Signed)
Subjective:    Patient ID: Heidi Carroll, female    DOB: [birth year], 76 y.o.   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 Gets out more  Takes the gabapentin at bedtime as well  Current Outpatient Prescriptions on File Prior to Visit  Medication Sig Dispense Refill  . acetaminophen (TYLENOL) 500 MG tablet Take 500 mg by mouth daily as needed for moderate pain or headache.    . ALPRAZolam (XANAX) 0.25 MG tablet TAKE 1/2 TO 1 (ONE-HALF TO ONE) TABLET BY MOUTH TWICE DAILY AS NEEDED FOR ANXIETY 60 tablet 0  . aspirin EC 81 MG tablet Take 81 mg by mouth every evening.     . Calcium Carbonate-Vitamin D (CALCIUM 600+D PO) Take 1 tablet by mouth every evening.     . diltiazem (CARDIZEM) 30 MG tablet Take 1 tablet (30 mg total) by mouth 2 (two) times daily. 60 tablet 11  . flecainide (TAMBOCOR) 50 MG tablet Take 1 tablet (50 mg total) by mouth 2 (two) times daily. 60 tablet 6  . gabapentin (NEURONTIN) 600 MG tablet TAKE ONE TABLET BY MOUTH AT BEDTIME 90 tablet 3  . hydrALAZINE (APRESOLINE) 25 MG tablet Take one tablet (25 mg) by mouth every 6 hours as needed for a blood pressure >170 on the top number 30 tablet 3  . mirtazapine (REMERON) 15 MG tablet Take 1 tablet (15 mg total) by mouth at bedtime. 30 tablet 1  . losartan (COZAAR) 50 MG tablet Take 0.5 tablets (25 mg total) by mouth daily. 1 tablet 0   No current facility-administered medications on file prior to visit.     Allergies  Allergen Reactions  . Cymbalta [Duloxetine Hcl] Nausea  Only  . Wellbutrin [Bupropion] Other (See Comments)    Elevated blood pressure, anxiety  . Zoloft [Sertraline Hcl] Nausea Only    Severe weight loss   . Nicotine Palpitations    Nicotine Patches   . Penicillins Rash    Has patient had a PCN reaction causing immediate rash, facial/tongue/throat swelling, SOB or lightheadedness with hypotension yes Has patient had a PCN reaction causing severe rash involving mucus membranes or skin necrosis: no Has patient had a PCN reaction that required hospitalization no Has patient had a PCN reaction occurring within the last 10 years: no If all of the above answers are "NO", then may proceed with Cephalosporin use.    Past Medical History:  Diagnosis Date  . A-fib (HCC)   . Depression   . Hypertension   . Neuropathy    idiopathic  . Presence of permanent cardiac pacemaker     Past Surgical History:  Procedure Laterality Date  . A-FLUTTER ABLATION N/A 02/04/2017   Procedure: A-Flutter Ablation;  Surgeon: Duke Salvia, MD;  Location: [facility] INVASIVE CV LAB;  Service: Cardiovascular;  Laterality: N/A;  . BUNIONECTOMY Bilateral 1995   bilateral   . CATARACT EXTRACTION W/ INTRAOCULAR LENS  IMPLANT, BILATERAL Bilateral   . INSERT / REPLACE / REMOVE PACEMAKER  06/03/2017  .  PACEMAKER IMPLANT N/A 06/03/2017   Procedure: Pacemaker Implant;  Surgeon: Duke Salvia, MD;  Location: [facility] INVASIVE CV LAB;  Service: Cardiovascular;  Laterality: N/A;  . THROAT SURGERY  1980s   "nodules in my throat"  . VAGINAL DELIVERY  1963   X 1  . VAGINAL HYSTERECTOMY  1974    Family History  Problem Relation Age of Onset  . Cancer Father        colon  . Heart attack Brother   . Diabetes Maternal Grandfather     Social History   Social History  . Marital status: Widowed    Spouse name: N/A  . Number of children: 1  . Years of education: N/A   Occupational History  . Retired, [employer])    Social History Main Topics  . Smoking status: Current  Some Day Smoker    Packs/day: 0.66    Years: 60.00    Types: Cigarettes  . Smokeless tobacco: Never Used  . Alcohol use No  . Drug use: No  . Sexual activity: No   Other Topics Concern  . Not on file   Social History Narrative   Husband died 08/21/23      Has living will   Has health care POA--- Chaya Jan, friend   Requests DNR--- form done 12/22/14   No tube feedings if cognitively unaware   Review of Systems Appetite is "a little too good" Weight is stable though Some constipation--?from the medication Voiding fine    Objective:   Physical Exam  Constitutional: She appears well-nourished. No distress.  Psychiatric: She has a normal mood and affect. Her behavior is normal.          Assessment & Plan:

## 2017-06-17 NOTE — Assessment & Plan Note (Signed)
Doing better Mood lability is better Has plans to new activity to provide purpose--hopefully this will help her sleep

## 2017-06-30 ENCOUNTER — Other Ambulatory Visit: Payer: Self-pay | Admitting: *Deleted

## 2017-06-30 MED ORDER — DILTIAZEM HCL 30 MG PO TABS: 30.0000 mg | ORAL_TABLET | Freq: Two times a day (BID) | ORAL | 3 refills | Status: DC

## 2017-07-03 ENCOUNTER — Ambulatory Visit (INDEPENDENT_AMBULATORY_CARE_PROVIDER_SITE_OTHER): Payer: Medicare Other

## 2017-07-03 DIAGNOSIS — Z23 Encounter for immunization: Secondary | ICD-10-CM

## 2017-07-15 ENCOUNTER — Other Ambulatory Visit: Payer: Self-pay

## 2017-07-15 MED ORDER — ALPRAZOLAM 0.25 MG PO TABS: ORAL_TABLET | ORAL | 0 refills | Status: DC

## 2017-07-15 NOTE — Telephone Encounter (Signed)
Left refill on voice mail at pharmacy Spoke to pt. 

## 2017-07-15 NOTE — Telephone Encounter (Signed)
Pt left v/m; pt wants to know if can take xanax with the meds pt is presently. If so pt needs refill xanax to [facility] [address]. Earlier in the year xanax was prescribed for panic attacks and panic attacks went away. Panic attacks have come back. Pt request cb. Xanax last refilled # 60 on 05/29/17. Last seen 06/17/17.

## 2017-07-15 NOTE — Telephone Encounter (Signed)
Yes--she can take the xanax with her other medications Can fill #60 x 0 Best if she limits it to only with significant panic type feelings---as regular use can cause dependence

## 2017-07-16 ENCOUNTER — Other Ambulatory Visit: Payer: Self-pay | Admitting: Internal Medicine

## 2017-07-23 ENCOUNTER — Encounter: Payer: Self-pay | Admitting: Internal Medicine

## 2017-07-23 ENCOUNTER — Ambulatory Visit (INDEPENDENT_AMBULATORY_CARE_PROVIDER_SITE_OTHER): Payer: Medicare Other | Admitting: Internal Medicine

## 2017-07-23 VITALS — BP 136/88 | HR 71 | Temp 98.0°F | Wt 151.0 lb

## 2017-07-23 DIAGNOSIS — I10 Essential (primary) hypertension: Secondary | ICD-10-CM

## 2017-07-23 DIAGNOSIS — R600 Localized edema: Secondary | ICD-10-CM | POA: Diagnosis not present

## 2017-07-23 DIAGNOSIS — R609 Edema, unspecified: Secondary | ICD-10-CM | POA: Insufficient documentation

## 2017-07-23 MED ORDER — LOSARTAN POTASSIUM 50 MG PO TABS: 50.0000 mg | ORAL_TABLET | Freq: Every day | ORAL | 0 refills | Status: DC

## 2017-07-23 NOTE — Assessment & Plan Note (Signed)
BP Readings from Last 3 Encounters:  07/23/17 136/88  06/17/17 126/80  06/04/17 (!) 171/70   Will have her increase the losartan to the  full tab

## 2017-07-23 NOTE — Assessment & Plan Note (Signed)
Fairly minor but she is aware of it Likely minor side effect from the diltiazem Reassured that it is not a big deal Discussed support hose if travels---elevation, etc

## 2017-07-23 NOTE — Progress Notes (Signed)
Subjective:    Patient ID: Heidi Carroll, female    DOB: 03-Jul-1941, 76 y.o.   MRN: [id number]  HPI Here with concerns about foot swelling  Some foot and ankle swelling over the past week Always bony ankles and now some swelling and hard time bending toes Usually would go down in bed--but didn't seem better the past 2 mornings No pain but has sense of the tightness in them  No chest pain No SOB No dizziness Pacemaker seems fine  Doesn't watch salt but rarely adds any Appetite is okay Weight is stable Drinks gatorade occasionally though  Current Outpatient Prescriptions on File Prior to Visit  Medication Sig Dispense Refill  . acetaminophen (TYLENOL) 500 MG tablet Take 500 mg by mouth daily as needed for moderate pain or headache.    . ALPRAZolam (XANAX) 0.25 MG tablet TAKE 1/2 TO 1 (ONE-HALF TO ONE) TABLET BY MOUTH TWICE DAILY AS NEEDED FOR ANXIETY 60 tablet 0  . aspirin EC 81 MG tablet Take 81 mg by mouth every evening.     . Calcium Carbonate-Vitamin D (CALCIUM 600+D PO) Take 1 tablet by mouth every evening.     . diltiazem (CARDIZEM) 30 MG tablet Take 1 tablet (30 mg total) by mouth 2 (two) times daily. 60 tablet 3  . flecainide (TAMBOCOR) 50 MG tablet Take 1 tablet (50 mg total) by mouth 2 (two) times daily. 60 tablet 6  . gabapentin (NEURONTIN) 600 MG tablet TAKE ONE TABLET BY MOUTH AT BEDTIME 90 tablet 3  . hydrALAZINE (APRESOLINE) 25 MG tablet Take one tablet (25 mg) by mouth every 6 hours as needed for a blood pressure >170 on the top number 30 tablet 3  . mirtazapine (REMERON) 15 MG tablet Take 1 tablet (15 mg total) by mouth at bedtime. 30 tablet 1  . losartan (COZAAR) 50 MG tablet Take 0.5 tablets (25 mg total) by mouth daily. 1 tablet 0   No current facility-administered medications on file prior to visit.     Allergies  Allergen Reactions  . Cymbalta [Duloxetine Hcl] Nausea Only  . Wellbutrin [Bupropion] Other (See Comments)    Elevated blood pressure,  anxiety  . Zoloft [Sertraline Hcl] Nausea Only    Severe weight loss   . Nicotine Palpitations    Nicotine Patches   . Penicillins Rash    Has patient had a PCN reaction causing immediate rash, facial/tongue/throat swelling, SOB or lightheadedness with hypotension yes Has patient had a PCN reaction causing severe rash involving mucus membranes or skin necrosis: no Has patient had a PCN reaction that required hospitalization no Has patient had a PCN reaction occurring within the last 10 years: no If all of the above answers are "NO", then may proceed with Cephalosporin use.    Past Medical History:  Diagnosis Date  . A-fib (HCC)   . Depression   . Hypertension   . Neuropathy    idiopathic  . Presence of permanent cardiac pacemaker     Past Surgical History:  Procedure Laterality Date  . A-FLUTTER ABLATION N/A 02/04/2017   Procedure: A-Flutter Ablation;  Surgeon: Duke Salvia, MD;  Location: [facility] INVASIVE CV LAB;  Service: Cardiovascular;  Laterality: N/A;  . BUNIONECTOMY Bilateral 1995   bilateral   . CATARACT EXTRACTION W/ INTRAOCULAR LENS  IMPLANT, BILATERAL Bilateral   . INSERT / REPLACE / REMOVE PACEMAKER  06/03/2017  . PACEMAKER IMPLANT N/A 06/03/2017   Procedure: Pacemaker Implant;  Surgeon: Duke Salvia, MD;  Location:  [facility] INVASIVE CV LAB;  Service: Cardiovascular;  Laterality: N/A;  . THROAT SURGERY  1980s   "nodules in my throat"  . VAGINAL DELIVERY  1963   X 1  . VAGINAL HYSTERECTOMY  1974    Family History  Problem Relation Age of Onset  . Cancer Father        colon  . Heart attack Brother   . Diabetes Maternal Grandfather     Social History   Social History  . Marital status: Widowed    Spouse name: N/A  . Number of children: 1  . Years of education: N/A   Occupational History  . Retired, [employer])    Social History Main Topics  . Smoking status: Current Some Day Smoker    Packs/day: 0.66    Years: 60.00    Types: Cigarettes  .  Smokeless tobacco: Never Used  . Alcohol use No  . Drug use: No  . Sexual activity: No   Other Topics Concern  . 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 No distress.  Cardiovascular: Normal rate, regular rhythm and normal heart sounds.  Exam reveals no gallop.   No murmur heard. Rare skips  Pulmonary/Chest: Effort normal and breath sounds normal. No respiratory distress. She has no wheezes. She has no rales.  Musculoskeletal:  Slight puffiness around middle toes No clear cut edema  Psychiatric: She has a normal mood and affect. Her behavior is normal.          Assessment & Plan:

## 2017-07-26 ENCOUNTER — Emergency Department
Admission: EM | Admit: 2017-07-26 | Discharge: 2017-07-26 | Disposition: A | Discharge status: Home / Self Care | Payer: Medicare Other | Attending: Emergency Medicine | Admitting: Emergency Medicine

## 2017-07-26 ENCOUNTER — Emergency Department: Payer: Medicare Other

## 2017-07-26 DIAGNOSIS — I10 Essential (primary) hypertension: Secondary | ICD-10-CM | POA: Diagnosis not present

## 2017-07-26 DIAGNOSIS — J449 Chronic obstructive pulmonary disease, unspecified: Secondary | ICD-10-CM | POA: Diagnosis not present

## 2017-07-26 DIAGNOSIS — R0602 Shortness of breath: Secondary | ICD-10-CM | POA: Diagnosis not present

## 2017-07-26 DIAGNOSIS — R079 Chest pain, unspecified: Secondary | ICD-10-CM | POA: Diagnosis not present

## 2017-07-26 DIAGNOSIS — F17210 Nicotine dependence, cigarettes, uncomplicated: Secondary | ICD-10-CM | POA: Insufficient documentation

## 2017-07-26 DIAGNOSIS — Z950 Presence of cardiac pacemaker: Secondary | ICD-10-CM | POA: Insufficient documentation

## 2017-07-26 LAB — TROPONIN I: Troponin I: <0.03 ng/mL (ref <0.03)

## 2017-07-26 LAB — BASIC METABOLIC PANEL
ANION GAP: 10 (ref 5–15)
BUN: 14 mg/dL (ref 6–20)
CHLORIDE: 102 mmol/L (ref 101–111)
CO2: 29 mmol/L (ref 22–32)
Calcium: 9.2 mg/dL (ref 8.9–10.3)
Creatinine, Ser: 0.69 mg/dL (ref 0.44–1.00)
GFR calc Af Amer: >60 mL/min (ref >60)
Glucose, Bld: 100 mg/dL — ABNORMAL HIGH (ref 65–99)
POTASSIUM: 4.3 mmol/L (ref 3.5–5.1)
SODIUM: 141 mmol/L (ref 135–145)

## 2017-07-26 LAB — CBC
HEMATOCRIT: 48.3 % — AB (ref 35.0–47.0)
Hemoglobin: 16.6 g/dL — ABNORMAL HIGH (ref 12.0–16.0)
MCH: 32.5 pg (ref 26.0–34.0)
MCHC: 34.3 g/dL (ref 32.0–36.0)
MCV: 94.9 fL (ref 80.0–100.0)
Platelets: 158 10*3/uL (ref 150–440)
RBC: 5.09 MIL/uL (ref 3.80–5.20)
RDW: 13.8 % (ref 11.5–14.5)
WBC: 6.4 10*3/uL (ref 3.6–11.0)

## 2017-07-26 NOTE — ED Triage Notes (Signed)
Pt came to ED via pov c/o high blood pressure since yesterday. Pt takes hydralazine and losartan. Had pacemarker placed last month. Some on and off chest pain lasting 20-30 seconds at a time starting yesterday.

## 2017-07-26 NOTE — Discharge Instructions (Signed)
Fortunately today your blood work, EKG, and your chest x-ray were reassuring and your blood pressure came down nicely on its own. Please make an appointment to follow-up with your primary care physician this coming week for reevaluation. Return to the emergency department sooner for any concerns whatsoever.  It was a pleasure to take care of you today, and thank you for coming to our emergency department.  If you have any questions or concerns before leaving please ask the nurse to grab me and I'm more than happy to go through your aftercare instructions again.  If you were prescribed any opioid pain medication today such as Norco, Vicodin, Percocet, morphine, hydrocodone, or oxycodone please make sure you do not drive when you are taking this medication as it can alter your ability to drive safely.  If you have any concerns once you are home that you are not improving or are in fact getting worse before you can make it to your follow-up appointment, please do not hesitate to call 911 and come back for further evaluation.  Merrily Brittle, MD  Results for orders placed or performed during the hospital encounter of 07/26/17  Basic metabolic panel  Result Value Ref Range   Sodium 141 135 - 145 mmol/L   Potassium 4.3 3.5 - 5.1 mmol/L   Chloride 102 101 - 111 mmol/L   CO2 29 22 - 32 mmol/L   Glucose, Bld 100 (H) 65 - 99 mg/dL   BUN 14 6 - 20 mg/dL   Creatinine, Ser 5.62 0.44 - 1.00 mg/dL   Calcium 9.2 8.9 - 13.0 mg/dL   GFR calc non Af Amer >60 >60 mL/min   GFR calc Af Amer >60 >60 mL/min   Anion gap 10 5 - 15  CBC  Result Value Ref Range   WBC 6.4 3.6 - 11.0 K/uL   RBC 5.09 3.80 - 5.20 MIL/uL   Hemoglobin 16.6 (H) 12.0 - 16.0 g/dL   HCT 86.5 (H) 78.4 - 69.6 %   MCV 94.9 80.0 - 100.0 fL   MCH 32.5 26.0 - 34.0 pg   MCHC 34.3 32.0 - 36.0 g/dL   RDW 29.5 28.4 - 13.2 %   Platelets 158 150 - 440 K/uL  Troponin I  Result Value Ref Range   Troponin I <0.03 <0.03 ng/mL   Dg Chest 2  View  Result Date: 07/26/2017 CLINICAL DATA:  Elevated blood pressure and shortness of breath. Chest pain. Smoker. EXAM: CHEST  2 VIEW COMPARISON:  06/04/2017 FINDINGS: Left subclavian approach pacemaker is unchanged in appearance with leads projecting over the right atrium and right ventricle. The cardiomediastinal silhouette is within normal limits. Aortic atherosclerosis is noted. The lungs remain hyperinflated without evidence of airspace consolidation, edema, pleural effusion, or pneumothorax. Mild S-shaped thoracolumbar scoliosis is noted. IMPRESSION: COPD without evidence of acute cardiopulmonary process. Electronically Signed   By: Sebastian Ache M.D.   On: 07/26/2017 12:54

## 2017-07-26 NOTE — ED Provider Notes (Signed)
[facility] Emergency Department Provider Note  ____________________________________________   First MD Initiated Contact with Patient 07/26/17 1212     (approximate)  I have reviewed the triage vital signs and the nursing notes.   HISTORY  Chief Complaint Hypertension   HPI Heidi Carroll is a 76 y.o. female who self presents to the emergency department with asymptomatic hypertension for the past 24 hours or so. She has a past medical history of hypertension for which she takes diltiazem, losartan, and was recently prescribed hydralazine for when her blood pressures above 170. She's never had a heart attack. She did have a pacemaker placed recently 1 month ago. She was at home checking her blood pressure this morning when she noted it was about 176 she took a tablet of hydralazine. She then checked it again and it was higher and she checked it a third time and it was 199 which prompted the visit today. She does report intermittent episodes of fleeting left sided sharp chest pain lasting seconds. Nonexertional. No shortness of breath. She is particularly concerned because her husband had a devastating stroke and she wants to make sure that she is not going to have one. She denies headache, double vision, blurred vision, numbness, weakness.   11/12/16 Echo: Study Conclusions  - Left ventricle: The cavity size was normal. Wall thickness was   normal. Systolic function was normal. The estimated ejection   fraction was in the range of 55% to 65%. - Aortic valve: There was mild regurgitation. Valve area (Vmax):   2.09 cm^2. - Mitral valve: There was mild regurgitation. - Right atrium: The atrium was mildly dilated.   Past Medical History:  Diagnosis Date  . A-fib (HCC)   . Depression   . Hypertension   . Neuropathy    idiopathic  . Presence of permanent cardiac pacemaker     Patient Active Problem List   Diagnosis Date Noted  . Edema 07/23/2017  .  Sinus node dysfunction (HCC) 06/03/2017  . Situational anxiety 03/05/2017  . Atrial fibrillation (HCC) 03/02/2017  . Typical atrial flutter (HCC) 02/04/2017  . COPD (chronic obstructive pulmonary disease) (HCC) 11/18/2016  . Mood disorder (HCC) 11/18/2016  . Essential hypertension, benign 11/18/2016  . Nicotine dependence 12/31/2015  . Idiopathic peripheral neuropathy 04/02/2007    Past Surgical History:  Procedure Laterality Date  . A-FLUTTER ABLATION N/A 02/04/2017   Procedure: A-Flutter Ablation;  Surgeon: Duke Salvia, MD;  Location: [facility] INVASIVE CV LAB;  Service: Cardiovascular;  Laterality: N/A;  . BUNIONECTOMY Bilateral 1995   bilateral   . CATARACT EXTRACTION W/ INTRAOCULAR LENS  IMPLANT, BILATERAL Bilateral   . INSERT / REPLACE / REMOVE PACEMAKER  06/03/2017  . PACEMAKER IMPLANT N/A 06/03/2017   Procedure: Pacemaker Implant;  Surgeon: Duke Salvia, MD;  Location: [facility] INVASIVE CV LAB;  Service: Cardiovascular;  Laterality: N/A;  . THROAT SURGERY  1980s   "nodules in my throat"  . VAGINAL DELIVERY  1963   X 1  . VAGINAL HYSTERECTOMY  1974    Prior to Admission medications   Medication Sig Start Date End Date Taking? Authorizing Provider  acetaminophen (TYLENOL) 500 MG tablet Take 500 mg by mouth daily as needed for moderate pain or headache.    [provider]  ALPRAZolam (XANAX) 0.25 MG tablet TAKE 1/2 TO 1 (ONE-HALF TO ONE) TABLET BY MOUTH TWICE DAILY AS NEEDED FOR ANXIETY 07/15/17   Karie Schwalbe, MD  aspirin EC 81 MG tablet Take  81 mg by mouth every evening.     [provider]  Calcium Carbonate-Vitamin D (CALCIUM 600+D PO) Take 1 tablet by mouth every evening.     [provider]  diltiazem (CARDIZEM) 30 MG tablet Take 1 tablet (30 mg total) by mouth 2 (two) times daily. 06/30/17   Duke Salvia, MD  flecainide (TAMBOCOR) 50 MG tablet Take 1 tablet (50 mg total) by mouth 2 (two) times daily. 03/06/17   Duke Salvia, MD  gabapentin  (NEURONTIN) 600 MG tablet TAKE ONE TABLET BY MOUTH AT BEDTIME 02/16/17   Karie Schwalbe, MD  hydrALAZINE (APRESOLINE) 25 MG tablet Take one tablet (25 mg) by mouth every 6 hours as needed for a blood pressure >170 on the top number 03/12/17   Duke Salvia, MD  losartan (COZAAR) 50 MG tablet Take 1 tablet (50 mg total) by mouth daily. 07/23/17 08/22/17  Karie Schwalbe, MD  mirtazapine (REMERON) 15 MG tablet Take 1 tablet (15 mg total) by mouth at bedtime. 05/29/17   Karie Schwalbe, MD    Allergies Cymbalta [duloxetine hcl]; Wellbutrin [bupropion]; Zoloft [sertraline hcl]; Nicotine; and Penicillins  Family History  Problem Relation Age of Onset  . Cancer Father        colon  . Heart attack Brother   . Diabetes Maternal Grandfather     Social History Social History  Substance Use Topics  . Smoking status: Current Some Day Smoker    Packs/day: 0.66    Years: 60.00    Types: Cigarettes  . Smokeless tobacco: Never Used  . Alcohol use No    Review of Systems Constitutional: No fever/chills Eyes: No visual changes. ENT: No sore throat. Cardiovascular: positive for chest pain. Respiratory: Denies shortness of breath. Gastrointestinal: No abdominal pain.  No nausea, no vomiting.  No diarrhea.  No constipation. Genitourinary: Negative for dysuria. Musculoskeletal: Negative for back pain. Skin: Negative for rash. Neurological: Negative for headaches, focal weakness or numbness.   ____________________________________________   PHYSICAL EXAM:  VITAL SIGNS: ED Triage Vitals  Enc Vitals Group     BP 07/26/17 1203 (!) 201/83     Pulse Rate 07/26/17 1203 87     Resp 07/26/17 1203 16     Temp 07/26/17 1203 (!) 97.5 F (36.4 C)     Temp Source 07/26/17 1203 Oral     SpO2 07/26/17 1203 93 %     Weight 07/26/17 1204 151 lb (68.5 kg)     Height 07/26/17 1204  (1.6 m)     Head Circumference --      Peak Flow --      Pain Score --      Pain Loc --      Pain Edu? --        Excl. in GC? --     Constitutional: alert and oriented 4 well appearing nontoxic no diaphoresis speaks in full clear sentences Eyes: PERRL EOMI. Head: Atraumatic. Nose: No congestion/rhinnorhea. Mouth/Throat: No trismus Neck: No stridor.   Cardiovascular: Normal rate, regular rhythm. Grossly normal heart sounds.  Good peripheral circulation. Respiratory: Normal respiratory effort.  No retractions. Lungs CTAB and moving good air Gastrointestinal: soft nontender Musculoskeletal: No lower extremity edema   Neurologic:  Normal speech and language. No gross focal neurologic deficits are appreciated. Skin:  Skin is warm, dry and intact. No rash noted. Psychiatric: Mood and affect are normal. Speech and behavior are normal.    ____________________________________________   DIFFERENTIAL includes but  not limited to  he symptomatically hypertension, acute coronary syndrome, acute kidney injury ____________________________________________   LABS (all labs ordered are listed, but only abnormal results are displayed)  Labs Reviewed  BASIC METABOLIC PANEL - Abnormal; Notable for the following:       Result Value   Glucose, Bld 100 (*)    All other components within normal limits  CBC - Abnormal; Notable for the following:    Hemoglobin 16.6 (*)    HCT 48.3 (*)    All other components within normal limits  TROPONIN I    blood work reviewed and interpreted by me with no acute disease aside from slightly elevated hemoglobin likely secondary to slight dehydration __________________________________________  EKG  ED ECG REPORT I, Merrily Brittle, the attending physician, personally viewed and interpreted this ECG.  Date: 07/26/2017 EKG Time:  Rate: 66 Rhythm: atrial paced rhythm QRS Axis: normal Intervals: first degree AV block ST/T Wave abnormalities: normal Narrative Interpretation: no evidence of acute  ischemia  ____________________________________________  RADIOLOGY  chest x-ray reviewed by me shows COPD changes but no acute disease ____________________________________________   PROCEDURES  Procedure(s) performed: no  Procedures  Critical Care performed: no  Observation: no ____________________________________________   INITIAL IMPRESSION / ASSESSMENT AND PLAN / ED COURSE  Pertinent labs & imaging results that were available during my care of the patient were reviewed by me and considered in my medical decision making (see chart for details).  The patient arrives very well-appearing hemodynamically stable with a normal exam. She did arrive with a slightly elevated blood pressure but without any medications obstructive to 160/60.  I had a lengthy discussion with the patient regarding asymptomatic hypertension and the need for close follow-up this week for titration of her antihypertensives. She verbalizes understanding and agreement with the plan.     ____________________________________________   FINAL CLINICAL IMPRESSION(S) / ED DIAGNOSES  Final diagnoses:  Hypertension, unspecified type      NEW MEDICATIONS STARTED DURING THIS VISIT:  New Prescriptions   No medications on file     Note:  This document was prepared using Dragon voice recognition software and may include unintentional dictation errors.     Merrily Brittle, MD 07/26/17 1310

## 2017-07-26 NOTE — ED Notes (Signed)
Patient transported to X-ray 

## 2017-07-27 ENCOUNTER — Telehealth: Payer: Self-pay

## 2017-07-27 NOTE — Telephone Encounter (Signed)
Yes she can And if these spells persist, I would probably increase the dose regularly She also should have the hydralazine for when it goes up like that

## 2017-07-27 NOTE — Telephone Encounter (Signed)
PLEASE NOTE: All timestamps contained within this report are represented as [place] Standard Time. CONFIDENTIALTY NOTICE: This fax transmission is intended only for the addressee. It contains information that is legally privileged, confidential or otherwise protected from use or disclosure. If you are not the intended recipient, you are strictly prohibited from reviewing, disclosing, copying using or disseminating any of this information or taking any action in reliance on or regarding this information. If you have received this fax in error, please notify us immediately by telephone so that we can arrange for its return to [place]. Phone: [phone], Toll-Free: [phone], Fax: [phone] Page: 1 of 1 Call Id: [id number] [facility] Night - Client TELEPHONE ADVICE RECORD [facility] Patient Name: BETINA PUCKETT Gender: Female DOB: 1941/08/16 Age: 76 Y 2 M 2 D Return Phone Number: [phone] (Primary) Address: City/State/Zip: [place] [address] Client [facility] Night - Client Client Site [facility] [place] - Night Physician Tillman Abide - MD Contact Type Call Who Is Calling Patient / Member / Family / Caregiver Call Type Triage / Clinical Relationship To Patient Self Return Phone Number [phone] (Primary) Chief Complaint Blood Pressure High Reason for Call Symptomatic / Request for Health Information Initial Comment Caller states blood pressure is 182/84 , and feels flushed. She has taken her BP medications. Translation No No Triage Reason Patient declined Nurse Assessment Nurse: Tawni Pummel, RN, Margaret Date/Time (Eastern Time): 07/26/2017 11:47:22 AM Confirm and document reason for call. If symptomatic, describe symptoms. ---Caller states that her BP was 199/91 she is feeling flush and has taken her BP medications at prescribed she is on way to the ED. Does not want triaged. Does the patient have  any new or worsening symptoms? ---Yes Will a triage be completed? ---No Select reason for no triage. ---Patient declined Please document clinical information provided and list any resource used. ---On way to ED. Guidelines Guideline Title Affirmed Question Affirmed Notes Nurse Date/Time (Eastern Time) Disp. Time Lamount Cohen Time) Disposition Final User 07/26/2017 11:48:59 AM Clinical Call Yes Cockrum, RN, Claris Che

## 2017-07-27 NOTE — Telephone Encounter (Signed)
She is asking if her numbers go backup, should she take an extra losartan?

## 2017-07-27 NOTE — Telephone Encounter (Signed)
Spoke to pt. She said she did not have a good weekend. She increased the losartan as directed at the OV. It did come down in the ER. She checked it once today and it was 121/72.

## 2017-07-27 NOTE — Telephone Encounter (Signed)
Please check on her today

## 2017-07-27 NOTE — Telephone Encounter (Signed)
Per chart review tab pt seen [facility] ED on 07/26/17.

## 2017-07-27 NOTE — Telephone Encounter (Signed)
Left detailed message on VM per DPR. 

## 2017-07-28 ENCOUNTER — Other Ambulatory Visit: Payer: Self-pay

## 2017-07-28 NOTE — Patient Outreach (Signed)
[facility]) Care Management  07/28/2017  Heidi Carroll 01-24-1941 [id number]    Telephone call to patient for ED utilization screening. RN Health Coach spoke with the patient and HIPAA verified. The patient was notified of our services and declined.  The patient states that she is doing well.  She states that she has had a pacemaker placed and it has helped her hear rate and blood pressure.  She states that she is watching the salt in her diet.  She stated that her last visit she went to the ER was for high blood pressure.  She states that her blood pressure fluctuates sometimes but mainly stays controlled. The patient states that she is able to take care of herself and drive to her appointments at this time.  She does not have any problems or questions about her medications.  Assessment: Consumer assessed no further intervention needed.  Plan: RN Health Coach will send a letter and brochure to patient.  Juanell Fairly RN, BSN, [place] RN Health Coach Disease Management [facility] Dial:  [phone] Fax: [phone]

## 2017-07-29 ENCOUNTER — Other Ambulatory Visit: Payer: Self-pay | Admitting: Internal Medicine

## 2017-09-03 ENCOUNTER — Encounter: Payer: Self-pay | Admitting: Internal Medicine

## 2017-09-03 ENCOUNTER — Ambulatory Visit (INDEPENDENT_AMBULATORY_CARE_PROVIDER_SITE_OTHER): Payer: Medicare Other | Admitting: Internal Medicine

## 2017-09-03 VITALS — BP 138/78 | HR 69 | Temp 97.9°F | Ht 61.75 in | Wt 152.0 lb

## 2017-09-03 DIAGNOSIS — Z23 Encounter for immunization: Secondary | ICD-10-CM

## 2017-09-03 DIAGNOSIS — I495 Sick sinus syndrome: Secondary | ICD-10-CM

## 2017-09-03 DIAGNOSIS — I483 Typical atrial flutter: Secondary | ICD-10-CM | POA: Diagnosis not present

## 2017-09-03 DIAGNOSIS — F39 Unspecified mood [affective] disorder: Secondary | ICD-10-CM | POA: Diagnosis not present

## 2017-09-03 DIAGNOSIS — Z0000 Encounter for general adult medical examination without abnormal findings: Secondary | ICD-10-CM | POA: Diagnosis not present

## 2017-09-03 DIAGNOSIS — I10 Essential (primary) hypertension: Secondary | ICD-10-CM

## 2017-09-03 DIAGNOSIS — G609 Hereditary and idiopathic neuropathy, unspecified: Secondary | ICD-10-CM | POA: Diagnosis not present

## 2017-09-03 DIAGNOSIS — J439 Emphysema, unspecified: Secondary | ICD-10-CM

## 2017-09-03 DIAGNOSIS — Z7189 Other specified counseling: Secondary | ICD-10-CM

## 2017-09-03 MED ORDER — MIRTAZAPINE 15 MG PO TABS: 15.0000 mg | ORAL_TABLET | Freq: Every day | ORAL | 3 refills | Status: DC

## 2017-09-03 NOTE — Addendum Note (Signed)
Addended by: Eual FinesBRIDGES, SHANNON P on: 09/03/2017 05:44 PM   Modules accepted: Orders

## 2017-09-03 NOTE — Assessment & Plan Note (Signed)
BP Readings from Last 3 Encounters:  09/03/17 138/78  07/26/17 (!) 162/64  07/23/17 136/88   Good control now Seems to be labile--but less high levels

## 2017-09-03 NOTE — Assessment & Plan Note (Signed)
Generally good with the gabapentin

## 2017-09-03 NOTE — Assessment & Plan Note (Signed)
No apparent recurrence after ablation

## 2017-09-03 NOTE — Progress Notes (Signed)
Hearing Screening   Method: Audiometry   125Hz 250Hz 500Hz 1000Hz 2000Hz 3000Hz 4000Hz 6000Hz 8000Hz  Right ear:   20 20 20  20    Left ear:   20 20 20  20      Visual Acuity Screening   Right eye Left eye Both eyes  Without correction:     With correction: 20/20 20/15 20/15    

## 2017-09-03 NOTE — Assessment & Plan Note (Signed)
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 Will continue the mirtazapine Regular anxiety---panic. Will have her take the 1/2 xanax every day for now--and wean if no symptoms for a long time

## 2017-09-03 NOTE — Assessment & Plan Note (Signed)
Has DNR 

## 2017-09-03 NOTE — Assessment & Plan Note (Signed)
Stable DOE Discussed cigarette cessation again

## 2017-09-03 NOTE — Assessment & Plan Note (Signed)
Pacer now in

## 2017-09-03 NOTE — Assessment & Plan Note (Signed)
I have personally reviewed the Medicare Annual Wellness questionnaire and have noted 1. The patient's medical and social history 2. Their use of alcohol, tobacco or illicit drugs 3. Their current medications and supplements 4. The patient's functional ability including ADL's, fall risks, home safety risks and hearing or visual             impairment. 5. Diet and physical activities 6. Evidence for depression or mood disorders  The patients weight, height, BMI and visual acuity have been recorded in the chart I have made referrals, counseling and provided education to the patient based review of the above and I have provided the pt with a written personalized care plan for preventive services.  I have provided you with a copy of your personalized plan for preventive services. Please take the time to review along with your updated medication list.  Had flu vaccine Pneumovax booster today Still prefers no cancer screening

## 2017-09-03 NOTE — Progress Notes (Signed)
Subjective:    Patient ID: Heidi Carroll, female    DOB: 1941-01-14, 76 y.o.   MRN: [id number]  HPI Here for Medicare wellness visit and follow up of chronic medical conditions Reviewed form and advanced directives Reviewed other doctors Still smoking---discussed  No alcohol Does try to walk regularly Vision is okay Hearing is very good Independent with instrumental ADLs No sig memory problems  Mood is better---depression has been helped by her volunteering at [employer] 3 days per week--- calls bingo and other things Anxiety is still bad---"comes from nowhere" Will start to bubble up and feel "like something is going to happen"---gets panic This happens ~ every other day Using the alprazolam more regularly--- daily over the past week (1/2 tab) This has prevented the spells  Ongoing neuropathy in feet Gets increased burning in day now that she is wearing socks Does okay with evening gabapentin  No palpitations No chest pain No dizziness or syncope Occasional slight edema if prolonged sitting--goes away quickly  No regular cough Still gets DOE if she pushes while walking Trying to reduce the smoking--doesn't when she volunteers Discussed considering quitting with patch/lozenges  Current Outpatient Medications on File Prior to Visit  Medication Sig Dispense Refill  . acetaminophen (TYLENOL) 500 MG tablet Take 500 mg by mouth daily as needed for moderate pain or headache.    . ALPRAZolam (XANAX) 0.25 MG tablet TAKE 1/2 TO 1 (ONE-HALF TO ONE) TABLET BY MOUTH TWICE DAILY AS NEEDED FOR ANXIETY 60 tablet 0  . aspirin EC 81 MG tablet Take 81 mg by mouth every evening.     . Calcium Carbonate-Vitamin D (CALCIUM 600+D PO) Take 1 tablet by mouth every evening.     . diltiazem (CARDIZEM) 30 MG tablet Take 1 tablet (30 mg total) by mouth 2 (two) times daily. 60 tablet 3  . flecainide (TAMBOCOR) 50 MG tablet Take 1 tablet (50 mg total) by mouth 2 (two) times daily. 60 tablet 6  .  gabapentin (NEURONTIN) 600 MG tablet TAKE ONE TABLET BY MOUTH AT BEDTIME 90 tablet 3  . hydrALAZINE (APRESOLINE) 25 MG tablet Take one tablet (25 mg) by mouth every 6 hours as needed for a blood pressure >170 on the top number 30 tablet 3  . mirtazapine (REMERON) 15 MG tablet TAKE 1 TABLET BY MOUTH AT BEDTIME 30 tablet 1  . losartan (COZAAR) 50 MG tablet Take 1 tablet (50 mg total) by mouth daily. 1 tablet 0   No current facility-administered medications on file prior to visit.     Allergies  Allergen Reactions  . Cymbalta [Duloxetine Hcl] Nausea Only  . Wellbutrin [Bupropion] Other (See Comments)    Elevated blood pressure, anxiety  . Zoloft [Sertraline Hcl] Nausea Only    Severe weight loss   . Nicotine Palpitations    Nicotine Patches   . Penicillins Rash    Has patient had a PCN reaction causing immediate rash, facial/tongue/throat swelling, SOB or lightheadedness with hypotension yes Has patient had a PCN reaction causing severe rash involving mucus membranes or skin necrosis: no Has patient had a PCN reaction that required hospitalization no Has patient had a PCN reaction occurring within the last 10 years: no If all of the above answers are "NO", then may proceed with Cephalosporin use.    Past Medical History:  Diagnosis Date  . A-fib (HCC)   . Depression   . Hypertension   . Neuropathy    idiopathic  . Presence of permanent cardiac  pacemaker     Past Surgical History:  Procedure Laterality Date  . BUNIONECTOMY Bilateral 1995   bilateral   . CATARACT EXTRACTION W/ INTRAOCULAR LENS  IMPLANT, BILATERAL Bilateral   . INSERT / REPLACE / REMOVE PACEMAKER  06/03/2017  . THROAT SURGERY  1980s   "nodules in my throat"  . VAGINAL DELIVERY  1963   X 1  . VAGINAL HYSTERECTOMY  1974    Family History  Problem Relation Age of Onset  . Cancer Father        colon  . Heart attack Brother   . Diabetes Maternal Grandfather     Social History   Socioeconomic History    . Marital status: Widowed    Spouse name: Not on file  . Number of children: 1  . Years of education: Not on file  . Highest education level: Not on file  Social Needs  . Financial resource strain: Not on file  . Food insecurity - worry: Not on file  . Food insecurity - inability: Not on file  . Transportation needs - medical: Not on file  . Transportation needs - non-medical: Not on file  Occupational History  . 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 Smoking status: Current Some Day Smoker    Packs/day: 0.66    Years: 60.00    Pack years: 39.60    Types: Cigarettes  . Smokeless tobacco: Never Used  Substance and Sexual Activity  . Alcohol use: No  . Drug use: No  . Sexual activity: No  Other Topics Concern  . Not on file  Social History Narrative   Husband died 10/14      Has living will   Has health care POA--- [name], friend   Requests DNR--- form done 12/22/14   No tube feedings if cognitively unaware   Review of Systems Sleeping better Appetite still not great but doing okay Weight back up to her usual baseline Wears seat belt Full dentures Constipated--miralax helps. No blood Voids fine. No incontinence No rash or suspicious skin lesions No heartburn or dysphagia    Objective:   Physical Exam  Constitutional: She is oriented to person, place, and time. She appears well-developed. No distress.  HENT:  Mouth/Throat: Oropharynx is clear and moist. No oropharyngeal exudate.  Neck: No thyromegaly present.  Cardiovascular: Normal rate, regular rhythm, normal heart sounds and intact distal pulses. Exam reveals no gallop.  No murmur heard. Pulmonary/Chest: Effort normal. No respiratory distress. She has no wheezes. She has no rales.  Decreased breath sounds  Abdominal: Soft. She exhibits no distension. There is no tenderness. There is no rebound and no guarding.  Musculoskeletal: She exhibits no edema or tenderness.  Lymphadenopathy:     She has no cervical adenopathy.  Neurological: She is alert and oriented to person, place, and time.  President-- "Garnet Koyanagionald Trump, Obama, Bush" [phone] Recall 3/3  Skin: No rash noted. No erythema.  Psychiatric: She has a normal mood and affect. Her behavior is normal.          Assessment & Plan:

## 2017-09-08 ENCOUNTER — Encounter: Payer: Self-pay | Admitting: Internal Medicine

## 2017-09-08 ENCOUNTER — Ambulatory Visit (INDEPENDENT_AMBULATORY_CARE_PROVIDER_SITE_OTHER): Payer: Medicare Other | Admitting: Internal Medicine

## 2017-09-08 VITALS — BP 160/78 | HR 79 | Ht 62.0 in | Wt 152.2 lb

## 2017-09-08 DIAGNOSIS — I483 Typical atrial flutter: Secondary | ICD-10-CM

## 2017-09-08 DIAGNOSIS — I495 Sick sinus syndrome: Secondary | ICD-10-CM | POA: Diagnosis not present

## 2017-09-08 MED ORDER — DILTIAZEM HCL ER COATED BEADS 120 MG PO CP24: 120.0000 mg | ORAL_CAPSULE | Freq: Every day | ORAL | 3 refills | Status: DC

## 2017-09-08 NOTE — Progress Notes (Signed)
Patient Care Team: Karie SchwalbeLetvak, Richard I, MD as PCP - General (Internal Medicine)   HPI  Heidi SoxSallye K Carroll is a 76 y.o. female Seen in follow-up for atrial flutter for which she underwent catheter ablation 4/18. She also has sinus node dysfunction and underwent pacing 8/18 [employer]   Echocardiogram 1/18 EF normal 55-65%. Palpitations>> underwent Holter monitoring>>> PVC                       Non sustained atrial tachycardia  180 bpm but only 4 beats                       There is some suggestion of atrial tach, not fast about 110                        She has had multiple trips to the emergency room because of flushing in the back of her head which is associated with hypertension.   We started her on flecainide for her palpitations. These are somewhat improved. She feels like she is going crazy.  DATE PR interval QRSduration Dose-Flecainide  5/18  132 90 0  8/18 NA 88 50  11/18 Ap 238 98 50        Her breathing is much better  She has now been diagnosed with panic attacks with palpitations and increased heart rate     Past Medical History:  Diagnosis Date  . A-fib (HCC)   . Depression   . Hypertension   . Neuropathy    idiopathic  . Presence of permanent cardiac pacemaker     Past Surgical History:  Procedure Laterality Date  . BUNIONECTOMY Bilateral 1995   bilateral   . CATARACT EXTRACTION W/ INTRAOCULAR LENS  IMPLANT, BILATERAL Bilateral   . INSERT / REPLACE / REMOVE PACEMAKER  06/03/2017  . THROAT SURGERY  1980s   "nodules in my throat"  . VAGINAL DELIVERY  1963   X 1  . VAGINAL HYSTERECTOMY  1974    Current Outpatient Medications  Medication Sig Dispense Refill  . acetaminophen (TYLENOL) 500 MG tablet Take 500 mg by mouth daily as needed for moderate pain or headache.    . ALPRAZolam (XANAX) 0.25 MG tablet TAKE 1/2 TO 1 (ONE-HALF TO ONE) TABLET BY MOUTH TWICE DAILY AS NEEDED FOR ANXIETY 60 tablet 0  . aspirin EC 81 MG tablet Take 81 mg by mouth every  evening.     . Calcium Carbonate-Vitamin D (CALCIUM 600+D PO) Take 1 tablet by mouth every evening.     . diltiazem (CARDIZEM) 30 MG tablet Take 1 tablet (30 mg total) by mouth 2 (two) times daily. 60 tablet 3  . flecainide (TAMBOCOR) 50 MG tablet Take 1 tablet (50 mg total) by mouth 2 (two) times daily. 60 tablet 6  . gabapentin (NEURONTIN) 600 MG tablet TAKE ONE TABLET BY MOUTH AT BEDTIME 90 tablet 3  . hydrALAZINE (APRESOLINE) 25 MG tablet Take one tablet (25 mg) by mouth every 6 hours as needed for a blood pressure >170 on the top number 30 tablet 3  . mirtazapine (REMERON) 15 MG tablet Take 1 tablet (15 mg total) at bedtime by mouth. 90 tablet 3  . losartan (COZAAR) 50 MG tablet Take 1 tablet (50 mg total) by mouth daily. 1 tablet 0   No current facility-administered medications for this visit.     Allergies  Allergen Reactions  .  Cymbalta [Duloxetine Hcl] Nausea Only  . Wellbutrin [Bupropion] Other (See Comments)    Elevated blood pressure, anxiety  . Zoloft [Sertraline Hcl] Nausea Only    Severe weight loss   . Nicotine Palpitations    Nicotine Patches   . Penicillins Rash    Has patient had a PCN reaction causing immediate rash, facial/tongue/throat swelling, SOB or lightheadedness with hypotension yes Has patient had a PCN reaction causing severe rash involving mucus membranes or skin necrosis: no Has patient had a PCN reaction that required hospitalization no Has patient had a PCN reaction occurring within the last 10 years: no If all of the above answers are "NO", then may proceed with Cephalosporin use.      Review of Systems negative except from HPI and PMH  Physical Exam BP (!) 160/78 (BP Location: Left Arm, Patient Position: Sitting, Cuff Size: Normal)   Pulse 79   Ht 5\' 2"  (1.575 m)   Wt 152 lb 4 oz (69.1 kg)   BMI 27.85 kg/m  Well developed and well nourished in no acute distress HENT normal E scleral and icterus clear Neck Supple JVP flat; carotids brisk  and full Clear to ausculation  Regular rate and rhythm, no murmurs gallops or rub Soft with active bowel sounds No clubbing cyanosis  Edema Alert and oriented, grossly normal motor and sensory function Skin Warm and Dry  ECG demonstrates A supraventricular rhythm with a rate of about 60. There are no discernible P waves except for one deflection in ST segment suggesting that this represents sinus arrest as opposed to atrial fibrillation as suggested by the computerized reading   Assessment and  Plan  Atrial flutter status post ablation  AFib  Palpitations with PVCs and PACs  Hypertension  Anxiety  /depression  Sinus arrest versus atrial fibrillation (see above)    SCAF now evident  Will follow for now but will hold off given short duration   With BP elevated, will change dilt from 30 bid >>120 daily This may also help with palpitations  Not sure if panic attacks are real, as she notes no triggers.  Could they be arrhythmia, as she has both Atach with 1:1 and AFib with variable VR  Will get her to use her calendar and then we will try and correlate with device detected arrhythmia  No afib

## 2017-09-08 NOTE — Patient Instructions (Addendum)
Medication Instructions:  Your physician has recommended you make the following change in your medication: STOP taking diltiazem 30mg  START taking diltiazem 120mg  once daily  Labwork: none  Testing/Procedures: Remote monitoring is used to monitor your Pacemaker of ICD from home. This monitoring reduces the number of office visits required to check your device to one time per year. It allows us to keep an eye on the functioning of your device to ensure it is working properly. You are scheduled for a device check from home on February 12. You may send your transmission at any time that day. If you have a wireless device, the transmission will be sent automatically. After your physician reviews your transmission, you will receive a postcard with your next transmission date.    Follow-Up: Your physician recommends that you schedule a follow-up appointment in: 6 weeks with Dr. Graciela HusbandsKlein.    Any Other Special Instructions Will Be Listed Below (If Applicable).     If you need a refill on your cardiac medications before your next appointment, please call your pharmacy.

## 2017-09-11 LAB — CUP PACEART INCLINIC DEVICE CHECK
Battery Remaining Longevity: 146 mo
Battery Voltage: 3.17 V
Brady Statistic AS VS Percent: 8.9 %
Brady Statistic RA Percent Paced: 91.23 %
Date Time Interrogation Session: 20181113160458
Implantable Lead Implant Date: 20180808
Implantable Lead Implant Date: 20180808
Implantable Lead Location: 753859
Implantable Lead Location: 753860
Implantable Pulse Generator Implant Date: 20180808
Lead Channel Pacing Threshold Amplitude: 1 V
Lead Channel Pacing Threshold Amplitude: 1 V
Lead Channel Pacing Threshold Pulse Width: 0.4 ms
Lead Channel Setting Pacing Amplitude: 2.5 V
Lead Channel Setting Pacing Pulse Width: 0.4 ms
Lead Channel Setting Sensing Sensitivity: 0.9 mV
MDC IDC MSMT LEADCHNL RA IMPEDANCE VALUE: 323 Ohm
MDC IDC MSMT LEADCHNL RA IMPEDANCE VALUE: 494 Ohm
MDC IDC MSMT LEADCHNL RA PACING THRESHOLD PULSEWIDTH: 0.4 ms
MDC IDC MSMT LEADCHNL RA SENSING INTR AMPL: 4.25 mV
MDC IDC MSMT LEADCHNL RV IMPEDANCE VALUE: 380 Ohm
MDC IDC MSMT LEADCHNL RV IMPEDANCE VALUE: 646 Ohm
MDC IDC MSMT LEADCHNL RV SENSING INTR AMPL: 6.75 mV
MDC IDC SET LEADCHNL RA PACING AMPLITUDE: 2 V
MDC IDC STAT BRADY AP VP PERCENT: 0.05 %
MDC IDC STAT BRADY AP VS PERCENT: 91.04 %
MDC IDC STAT BRADY AS VP PERCENT: 0 %
MDC IDC STAT BRADY RV PERCENT PACED: 0.06 %

## 2017-09-15 ENCOUNTER — Telehealth: Payer: Self-pay | Admitting: Internal Medicine

## 2017-09-15 NOTE — Telephone Encounter (Signed)
Copied from CRM [id number]. Topic: General - Other >> Sep 15, 2017  2:21 PM Elliot GaultBell, Tiffany M wrote: Relation to RU:EAVWpt:self Call back number: [phone]   Reason for call:  Patient would like PCP insight regarding new medication prescribed by Dr. Graciela HusbandsKlein, Viviann SpareSteven,   diltiazem (CARDIZEM CD) 120 MG 24 hr capsule. Patient states unsure if medication will conflict with her current medication, please advise

## 2017-09-16 NOTE — Telephone Encounter (Signed)
Spoke to pt. She will let us know if she has any issues. 

## 2017-09-16 NOTE — Telephone Encounter (Signed)
Please let her know that it is a good medication that I use regularly and should not conflict with any of her current medications. Still, she should let us know if she seems to have any problems after starting it

## 2017-10-03 ENCOUNTER — Other Ambulatory Visit: Payer: Self-pay | Admitting: Internal Medicine

## 2017-10-13 ENCOUNTER — Ambulatory Visit (INDEPENDENT_AMBULATORY_CARE_PROVIDER_SITE_OTHER): Payer: Medicare Other | Admitting: Internal Medicine

## 2017-10-13 VITALS — BP 148/70 | HR 76 | Ht 62.5 in | Wt 152.2 lb

## 2017-10-13 DIAGNOSIS — I495 Sick sinus syndrome: Secondary | ICD-10-CM | POA: Diagnosis not present

## 2017-10-13 DIAGNOSIS — I483 Typical atrial flutter: Secondary | ICD-10-CM | POA: Diagnosis not present

## 2017-10-13 DIAGNOSIS — Z950 Presence of cardiac pacemaker: Secondary | ICD-10-CM | POA: Diagnosis not present

## 2017-10-13 LAB — CUP PACEART INCLINIC DEVICE CHECK
Battery Voltage: 3.15 V
Brady Statistic AP VP Percent: 0.07 %
Brady Statistic AS VP Percent: 0 %
Implantable Lead Location: 753860
Implantable Lead Model: 3830
Lead Channel Impedance Value: 323 Ohm
Lead Channel Impedance Value: 399 Ohm
Lead Channel Impedance Value: 684 Ohm
Lead Channel Pacing Threshold Amplitude: 1.25 V
Lead Channel Pacing Threshold Pulse Width: 0.4 ms
Lead Channel Sensing Intrinsic Amplitude: 5.875 mV
Lead Channel Setting Pacing Amplitude: 2 V
Lead Channel Setting Pacing Pulse Width: 0.4 ms
Lead Channel Setting Sensing Sensitivity: 0.9 mV
MDC IDC LEAD IMPLANT DT: 20180808
MDC IDC LEAD IMPLANT DT: 20180808
MDC IDC LEAD LOCATION: 753859
MDC IDC MSMT BATTERY REMAINING LONGEVITY: 157 mo
MDC IDC MSMT LEADCHNL RA IMPEDANCE VALUE: 513 Ohm
MDC IDC MSMT LEADCHNL RA PACING THRESHOLD AMPLITUDE: 1 V
MDC IDC MSMT LEADCHNL RA PACING THRESHOLD PULSEWIDTH: 0.4 ms
MDC IDC PG IMPLANT DT: 20180808
MDC IDC SESS DTM: 20181218130236
MDC IDC SET LEADCHNL RV PACING AMPLITUDE: 2.5 V
MDC IDC STAT BRADY AP VS PERCENT: 97.66 %
MDC IDC STAT BRADY AS VS PERCENT: 2.27 %
MDC IDC STAT BRADY RA PERCENT PACED: 98 %
MDC IDC STAT BRADY RV PERCENT PACED: 0.07 %

## 2017-10-13 NOTE — Patient Instructions (Addendum)
Medication Instructions: - Your physician recommends that you continue on your current medications as directed. Please refer to the Current Medication list given to you today.  Labwork: - none ordered  Procedures/Testing: - none ordered  Follow-Up: - Remote monitoring is used to monitor your Pacemaker of ICD from home. This monitoring reduces the number of office visits required to check your device to one time per year. It allows us to keep an eye on the functioning of your device to ensure it is working properly. You are scheduled for a device check from home on 01/12/18. You may send your transmission at any time that day. If you have a wireless device, the transmission will be sent automatically. After your physician reviews your transmission, you will receive a postcard with your next transmission date.   - Your physician wants you to follow-up in: 1 year with Dr. Graciela HusbandsKlein. You will receive a reminder letter in the mail two months in advance. If you don't receive a letter, please call our office to schedule the follow-up appointment.   Any Additional Special Instructions Will Be Listed Below (If Applicable).     If you need a refill on your cardiac medications before your next appointment, please call your pharmacy.

## 2017-10-13 NOTE — Progress Notes (Signed)
Patient Care Team: Karie SchwalbeLetvak, Richard I, MD as PCP - General (Internal Medicine)   HPI  Heidi Carroll is a 76 y.o. female Seen in follow-up for atrial flutter for which she underwent catheter ablation 4/18. She also has sinus node dysfunction and underwent pacing 8/18 [employer]   Echocardiogram 1/18 EF normal 55-65%. Palpitations>> underwent Holter monitoring>>> PVC                       Non sustained atrial tachycardia  180 bpm but only 4 beats                       There is some suggestion of atrial tach, not fast about 110                        She has had multiple trips to the emergency room because of flushing in the back of her head which is associated with hypertension.   We started her on flecainide for her palpitations. These are somewhat improved.    DATE PR interval QRSduration Dose-Flecainide  5/18  132 90 0  8/18 NA 88 50  11/18 Ap 238 98 50        Overall from a cardiac point of view she is doing much better with fewer palpitations.  He continues to have chronic dyspnea on exertion.  She denies chest pain or edema.  She dreads Christmas coming.  She is all alone this year.      Past Medical History:  Diagnosis Date  . A-fib (HCC)   . Depression   . Hypertension   . Neuropathy    idiopathic  . Presence of permanent cardiac pacemaker     Past Surgical History:  Procedure Laterality Date  . A-FLUTTER ABLATION N/A 02/04/2017   Procedure: A-Flutter Ablation;  Surgeon: Duke SalviaSteven C Klein, MD;  [facility];  Service: Cardiovascular;  Laterality: N/A;  . BUNIONECTOMY Bilateral 1995   bilateral   . CATARACT EXTRACTION W/ INTRAOCULAR LENS  IMPLANT, BILATERAL Bilateral   . INSERT / REPLACE / REMOVE PACEMAKER  06/03/2017  . PACEMAKER IMPLANT N/A 06/03/2017   Procedure: Pacemaker Implant;  Surgeon: Duke SalviaKlein, Steven C, MD;  [facility];  Service: Cardiovascular;  Laterality: N/A;  . THROAT SURGERY  1980s   "nodules in my throat"  .  VAGINAL DELIVERY  1963   X 1  . VAGINAL HYSTERECTOMY  1974    Current Outpatient Medications  Medication Sig Dispense Refill  . acetaminophen (TYLENOL) 500 MG tablet Take 500 mg by mouth daily as needed for moderate pain or headache.    . ALPRAZolam (XANAX) 0.25 MG tablet TAKE 1/2 TO 1 (ONE-HALF TO ONE) TABLET BY MOUTH TWICE DAILY AS NEEDED FOR ANXIETY 60 tablet 0  . aspirin EC 81 MG tablet Take 81 mg by mouth every evening.     . Calcium Carbonate-Vitamin D (CALCIUM 600+D PO) Take 1 tablet by mouth every evening.     . diltiazem (CARDIZEM CD) 120 MG 24 hr capsule Take 1 capsule (120 mg total) daily by mouth. 90 capsule 3  . flecainide (TAMBOCOR) 50 MG tablet TAKE 1 TABLET BY MOUTH TWICE DAILY 180 tablet 1  . gabapentin (NEURONTIN) 600 MG tablet TAKE ONE TABLET BY MOUTH AT BEDTIME 90 tablet 3  . hydrALAZINE (APRESOLINE) 25 MG tablet Take one tablet (25 mg) by mouth every 6 hours  as needed for a blood pressure >170 on the top number 30 tablet 3  . mirtazapine (REMERON) 15 MG tablet Take 1 tablet (15 mg total) at bedtime by mouth. 90 tablet 3  . losartan (COZAAR) 50 MG tablet Take 1 tablet (50 mg total) by mouth daily. 1 tablet 0   No current facility-administered medications for this visit.     Allergies  Allergen Reactions  . Cymbalta [Duloxetine Hcl] Nausea Only  . Wellbutrin [Bupropion] Other (See Comments)    Elevated blood pressure, anxiety  . Zoloft [Sertraline Hcl] Nausea Only    Severe weight loss   . Nicotine Palpitations    Nicotine Patches   . Penicillins Rash    Has patient had a PCN reaction causing immediate rash, facial/tongue/throat swelling, SOB or lightheadedness with hypotension yes Has patient had a PCN reaction causing severe rash involving mucus membranes or skin necrosis: no Has patient had a PCN reaction that required hospitalization no Has patient had a PCN reaction occurring within the last 10 years: no If all of the above answers are "NO", then may  proceed with Cephalosporin use.      Review of Systems negative except from HPI and PMH  Physical Exam BP (!) 148/70 (BP Location: Left Arm, Patient Position: Sitting, Cuff Size: Normal)   Pulse 76   Ht 5' 2.5" (1.588 m)   Wt 152 lb 4 oz (69.1 kg)   BMI 27.40 kg/m  Well developed and nourished in no acute distress HENT normal Neck supple with JVP-flat Clear Regular rate and rhythm, no murmurs or gallops Abd-soft with active BS No Clubbing cyanosis edema Skin-warm and dry A & Oriented  Grossly normal sensory and motor function   ECG sinus    Assessment and  Plan  Atrial flutter status post ablation  AFib  Palpitations with PVCs and PACs  Hypertension  Anxiety  /depression  Sinus arrest versus atrial fibrillation (see above)   Palpitations are much less problematic.  Blood pressure is little bit high but she is very stressed today.  We will ask the primary physician to follow-up with that.  The long time trying to come up with a plan to deal with Christmas all alone.  We spent more than 50% of our >25 min visit in face to face counseling regarding the above

## 2018-01-12 ENCOUNTER — Ambulatory Visit (INDEPENDENT_AMBULATORY_CARE_PROVIDER_SITE_OTHER): Payer: Medicare Other | Admitting: *Deleted

## 2018-01-12 DIAGNOSIS — I495 Sick sinus syndrome: Secondary | ICD-10-CM | POA: Diagnosis not present

## 2018-01-12 NOTE — Progress Notes (Signed)
Remote pacemaker transmission.   

## 2018-01-13 ENCOUNTER — Encounter: Payer: Self-pay | Admitting: Cardiology

## 2018-01-19 LAB — CUP PACEART REMOTE DEVICE CHECK
Battery Remaining Longevity: 150 mo
Brady Statistic AP VS Percent: 99.46 %
Brady Statistic AS VS Percent: 0.49 %
Brady Statistic RV Percent Paced: 0.05 %
Date Time Interrogation Session: 20190319052927
Implantable Lead Implant Date: 20180808
Implantable Lead Location: 753859
Lead Channel Impedance Value: 285 Ohm
Lead Channel Impedance Value: 437 Ohm
Lead Channel Impedance Value: 475 Ohm
Lead Channel Pacing Threshold Amplitude: 1.25 V
Lead Channel Sensing Intrinsic Amplitude: 3.5 mV
Lead Channel Sensing Intrinsic Amplitude: 3.5 mV
Lead Channel Sensing Intrinsic Amplitude: 6 mV
Lead Channel Setting Pacing Amplitude: 2 V
Lead Channel Setting Pacing Amplitude: 2.75 V
Lead Channel Setting Pacing Pulse Width: 0.4 ms
Lead Channel Setting Sensing Sensitivity: 0.9 mV
MDC IDC LEAD IMPLANT DT: 20180808
MDC IDC LEAD LOCATION: 753860
MDC IDC MSMT BATTERY VOLTAGE: 3.12 V
MDC IDC MSMT LEADCHNL RA PACING THRESHOLD AMPLITUDE: 0.875 V
MDC IDC MSMT LEADCHNL RA PACING THRESHOLD PULSEWIDTH: 0.4 ms
MDC IDC MSMT LEADCHNL RV IMPEDANCE VALUE: 361 Ohm
MDC IDC MSMT LEADCHNL RV PACING THRESHOLD PULSEWIDTH: 0.4 ms
MDC IDC MSMT LEADCHNL RV SENSING INTR AMPL: 6 mV
MDC IDC PG IMPLANT DT: 20180808
MDC IDC STAT BRADY AP VP PERCENT: 0.04 %
MDC IDC STAT BRADY AS VP PERCENT: 0.01 %
MDC IDC STAT BRADY RA PERCENT PACED: 99.64 %

## 2018-01-25 ENCOUNTER — Other Ambulatory Visit: Payer: Self-pay | Admitting: Internal Medicine

## 2018-01-26 NOTE — Telephone Encounter (Signed)
Last filled: 10/19/17 #60 Last OV: 09/03/17 Next OV: 09/06/18

## 2018-02-13 ENCOUNTER — Other Ambulatory Visit: Payer: Self-pay | Admitting: Internal Medicine

## 2018-04-03 ENCOUNTER — Other Ambulatory Visit: Payer: Self-pay | Admitting: Internal Medicine

## 2018-04-13 ENCOUNTER — Ambulatory Visit (INDEPENDENT_AMBULATORY_CARE_PROVIDER_SITE_OTHER): Payer: Medicare Other | Admitting: *Deleted

## 2018-04-13 DIAGNOSIS — I471 Supraventricular tachycardia: Secondary | ICD-10-CM

## 2018-04-13 DIAGNOSIS — I495 Sick sinus syndrome: Secondary | ICD-10-CM

## 2018-04-13 NOTE — Progress Notes (Signed)
Remote pacemaker transmission.   

## 2018-04-16 LAB — CUP PACEART REMOTE DEVICE CHECK
Battery Remaining Longevity: 146 mo
Battery Voltage: 3.08 V
Brady Statistic AS VS Percent: 0.6 %
Brady Statistic RA Percent Paced: 99.57 %
Brady Statistic RV Percent Paced: 0.07 %
Date Time Interrogation Session: 20190618051354
Implantable Lead Implant Date: 20180808
Implantable Lead Implant Date: 20180808
Implantable Lead Location: 753859
Implantable Lead Location: 753860
Implantable Lead Model: 3830
Implantable Pulse Generator Implant Date: 20180808
Lead Channel Impedance Value: 532 Ohm
Lead Channel Pacing Threshold Amplitude: 0.875 V
Lead Channel Pacing Threshold Pulse Width: 0.4 ms
Lead Channel Sensing Intrinsic Amplitude: 3 mV
Lead Channel Sensing Intrinsic Amplitude: 3 mV
Lead Channel Sensing Intrinsic Amplitude: 7.125 mV
Lead Channel Setting Sensing Sensitivity: 0.9 mV
MDC IDC MSMT LEADCHNL RA IMPEDANCE VALUE: 285 Ohm
MDC IDC MSMT LEADCHNL RA IMPEDANCE VALUE: 418 Ohm
MDC IDC MSMT LEADCHNL RV IMPEDANCE VALUE: 399 Ohm
MDC IDC MSMT LEADCHNL RV PACING THRESHOLD AMPLITUDE: 1.5 V
MDC IDC MSMT LEADCHNL RV PACING THRESHOLD PULSEWIDTH: 0.4 ms
MDC IDC MSMT LEADCHNL RV SENSING INTR AMPL: 7.125 mV
MDC IDC SET LEADCHNL RA PACING AMPLITUDE: 2 V
MDC IDC SET LEADCHNL RV PACING AMPLITUDE: 3 V
MDC IDC SET LEADCHNL RV PACING PULSEWIDTH: 0.4 ms
MDC IDC STAT BRADY AP VP PERCENT: 0.07 %
MDC IDC STAT BRADY AP VS PERCENT: 99.33 %
MDC IDC STAT BRADY AS VP PERCENT: 0 %

## 2018-04-28 ENCOUNTER — Encounter: Payer: Self-pay | Admitting: Internal Medicine

## 2018-04-28 ENCOUNTER — Ambulatory Visit (INDEPENDENT_AMBULATORY_CARE_PROVIDER_SITE_OTHER): Payer: Medicare Other | Admitting: Internal Medicine

## 2018-04-28 VITALS — BP 128/84 | HR 69 | Temp 98.0°F | Ht 63.0 in | Wt 152.0 lb

## 2018-04-28 DIAGNOSIS — J439 Emphysema, unspecified: Secondary | ICD-10-CM

## 2018-04-28 DIAGNOSIS — I480 Paroxysmal atrial fibrillation: Secondary | ICD-10-CM | POA: Diagnosis not present

## 2018-04-28 DIAGNOSIS — F39 Unspecified mood [affective] disorder: Secondary | ICD-10-CM | POA: Diagnosis not present

## 2018-04-28 NOTE — Assessment & Plan Note (Signed)
No major symptoms now Still not able to stop smoking

## 2018-04-28 NOTE — Assessment & Plan Note (Signed)
Heart regular on flecainide (paced) On the aspirin

## 2018-04-28 NOTE — Assessment & Plan Note (Addendum)
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

## 2018-04-28 NOTE — Progress Notes (Signed)
Subjective:    Patient ID: Heidi Carroll, female    DOB: Jan 23, 1941, 77 y.o.   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 acetaminophen (TYLENOL) 500 MG tablet Take 500 mg by mouth daily as needed for moderate pain or headache.    . ALPRAZolam (XANAX) 0.25 MG tablet TAKE 1/2 TO 1 (ONE-HALF TO ONE) TABLET BY MOUTH TWICE DAILY AS NEEDED FOR ANXIETY 60 tablet 0  . aspirin EC 81 MG tablet Take 81 mg by mouth every evening.     . Calcium Carbonate-Vitamin D (CALCIUM 600+D PO) Take 1 tablet by mouth every evening.     . flecainide (TAMBOCOR) 50 MG tablet TAKE 1 TABLET BY MOUTH TWICE DAILY 180 tablet 1  . gabapentin (NEURONTIN) 600 MG tablet TAKE 1 TABLET BY MOUTH AT BEDTIME 90 tablet 3  . hydrALAZINE (APRESOLINE) 25 MG tablet Take one tablet (25 mg) by mouth every 6 hours as needed for a blood pressure >170 on the top number 30 tablet 3  . mirtazapine (REMERON) 15 MG tablet Take 1 tablet (15 mg total) at bedtime by mouth. 90 tablet 3  . diltiazem (CARDIZEM CD) 120 MG 24 hr capsule Take 1 capsule (120 mg total) daily by mouth. 90 capsule 3  . losartan (COZAAR) 50 MG tablet Take 1 tablet (50 mg total) by mouth daily. 1 tablet 0     No current facility-administered medications on file prior to visit.     Allergies  Allergen Reactions  . Cymbalta [Duloxetine Hcl] Nausea Only  . Wellbutrin [Bupropion] Other (See Comments)    Elevated blood pressure, anxiety  . Zoloft [Sertraline Hcl] Nausea Only    Severe weight loss   . Nicotine Palpitations    Nicotine Patches   . Penicillins Rash    Has patient had a PCN reaction causing immediate rash, facial/tongue/throat swelling, SOB or lightheadedness with hypotension yes Has patient had a PCN reaction causing severe rash involving mucus membranes or skin necrosis: no Has patient had a PCN reaction that required hospitalization no Has patient had a PCN reaction occurring within the last 10 years: no If all of the above answers are "NO", then may proceed with Cephalosporin use.    Past Medical History:  Diagnosis Date  . A-fib (HCC)   . Depression   . Hypertension   . Neuropathy    idiopathic  . Presence of permanent cardiac pacemaker     Past Surgical History:  Procedure Laterality Date  . A-FLUTTER ABLATION N/A 02/04/2017   Procedure: A-Flutter Ablation;  Surgeon: Duke SalviaSteven C Klein, MD;  [facility];  Service: Cardiovascular;  Laterality: N/A;  . BUNIONECTOMY Bilateral 1995  bilateral   . CATARACT EXTRACTION W/ INTRAOCULAR LENS  IMPLANT, BILATERAL Bilateral   . INSERT / REPLACE / REMOVE PACEMAKER  06/03/2017  . PACEMAKER IMPLANT N/A 06/03/2017   Procedure: Pacemaker Implant;  Surgeon: Duke Salvia, MD;  Location: [facility] INVASIVE CV LAB;  Service: Cardiovascular;  Laterality: N/A;  . THROAT SURGERY  1980s   "nodules in my throat"  . VAGINAL DELIVERY  1963   X 1  . VAGINAL HYSTERECTOMY  1974    Family History  Problem Relation Age of Onset  . Cancer Father        colon  . Heart attack Brother   . Diabetes Maternal Grandfather     Social History   Socioeconomic History  . Marital status: Widowed    Spouse name: Not on file  . Number  of children: 1  . Years of education: Not on file  . Highest education level: Not on file  Occupational History  . Occupation: Retired, [employer]selling psychologist)  Social Needs  . Financial resource strain: Not on file  . Food insecurity:    Worry: Not on file    Inability: Not on file  . Transportation needs:    Medical: Not on file    Non-medical: Not on file  Tobacco Use  . Smoking status: Current Some Day Smoker    Packs/day: 0.66    Years: 60.00    Pack years: 39.60    Types: Cigarettes  . Smokeless tobacco: Never Used  Substance and Sexual Activity  . Alcohol use: No  . Drug use: No  . Sexual activity: Never  Lifestyle  . Physical activity:    Days per week: Not on file    Minutes per session: Not on file  . Stress: Not on file  Relationships  . Social connections:    Talks on phone: Not on file    Gets together: Not on file    Attends religious service: Not on file    Active member of club or organization: Not on file    Attends meetings of clubs or organizations: Not on file    Relationship status: Not on file  . Intimate partner violence:    Fear of current or ex partner: Not on file    Emotionally abused: Not on file    Physically abused: Not on file    Forced sexual activity: Not on file  Other Topics Concern  . Not on file  Social History Narrative   Husband died 08-28-2023      Has living will   Has health care POA--- Chaya Jan, friend   Requests DNR--- form done 12/22/14   No tube feedings if cognitively unaware   Review of Systems Not really sleeping well---sleeps but not rested (will awake "in a knot") Appetite is so-so Weight is stable    Objective:   Physical Exam  Constitutional: She appears well-developed. No distress.  Neck: No thyromegaly present.  Cardiovascular: Normal rate, regular rhythm and normal heart sounds. Exam reveals no gallop.  No murmur heard. Respiratory: Effort normal and breath sounds normal. No respiratory distress. She  has no wheezes. She has no rales.  Lymphadenopathy:    She has no cervical adenopathy.  Psychiatric:  Mild depression persists Not anxious now           Assessment & Plan:

## 2018-05-13 ENCOUNTER — Other Ambulatory Visit: Payer: Self-pay | Admitting: Internal Medicine

## 2018-05-14 NOTE — Telephone Encounter (Signed)
Last filled 01-26-18 #60 Last OV 04-28-18 Next OV 09-06-18  [facility] [address]

## 2018-05-26 ENCOUNTER — Telehealth: Payer: Self-pay

## 2018-05-26 NOTE — Telephone Encounter (Signed)
She could try some loratadine to dry up the drainage DM cough meds are also okay to try

## 2018-05-26 NOTE — Telephone Encounter (Signed)
I spoke with pt; for 4 days pt has sinus drainage at back of throat; when blows nose mucus is clear but having to blow nose excessive amt. Non productive cough, slight earache on lt and thought had fever on 05/25/18 but no thermometer to verify.No S/T. Pt wants to know what can take OTC with pts other meds. Pt request [facility]. [facility] [address].

## 2018-05-26 NOTE — Telephone Encounter (Signed)
Copied from CRM [phone]. Topic: General - Other >> May 26, 2018  8:58 AM Gaynelle AduPoole, Shalonda wrote: Reason for EAV:[id number]:Patient is calling to request a nurse or Dr. Alphonsus SiasLetvak to give her a call back in regards to her medications. Please advise

## 2018-05-26 NOTE — Telephone Encounter (Signed)
Spoke to pt. She will get one of the options.

## 2018-07-01 ENCOUNTER — Other Ambulatory Visit: Payer: Self-pay | Admitting: *Deleted

## 2018-07-02 ENCOUNTER — Telehealth: Payer: Self-pay | Admitting: Internal Medicine

## 2018-07-02 MED ORDER — LOSARTAN POTASSIUM 50 MG PO TABS: 50.0000 mg | ORAL_TABLET | Freq: Every day | ORAL | 2 refills | Status: DC

## 2018-07-02 NOTE — Telephone Encounter (Signed)
Please call to discuss Losartan. Pt asks if this medication has been changed to Cardia.

## 2018-07-02 NOTE — Telephone Encounter (Signed)
I called and spoke with the patient. She states she has changed pharmacies from the [facility] [address] to [address]. She states she is needing her losartan filled, but the pharmacy told her cartia was filled. She wanted to make sure we didn't change her medications. I advised her that when she saw Dr. Graciela Husbands in December 2018, she was taking diltiazem (cartia) & losartan both. She is aware they are 2 different classes of drug and we have not changed either one. She asked that we send in a refill for losartan 50 mg once daily. I advised her I will send [facility] to [facility] on [address]

## 2018-07-13 ENCOUNTER — Ambulatory Visit (INDEPENDENT_AMBULATORY_CARE_PROVIDER_SITE_OTHER): Payer: Medicare Other | Admitting: *Deleted

## 2018-07-13 DIAGNOSIS — I495 Sick sinus syndrome: Secondary | ICD-10-CM

## 2018-07-13 NOTE — Progress Notes (Signed)
Remote pacemaker transmission.   

## 2018-08-02 DIAGNOSIS — Z23 Encounter for immunization: Secondary | ICD-10-CM | POA: Diagnosis not present

## 2018-08-04 LAB — CUP PACEART REMOTE DEVICE CHECK
Battery Voltage: 3.05 V
Brady Statistic AS VS Percent: 5.88 %
Date Time Interrogation Session: 20190917111951
Implantable Lead Location: 753859
Implantable Lead Model: 5076
Implantable Pulse Generator Implant Date: 20180808
Lead Channel Impedance Value: 285 Ohm
Lead Channel Pacing Threshold Amplitude: 0.875 V
Lead Channel Pacing Threshold Amplitude: 1.375 V
Lead Channel Pacing Threshold Pulse Width: 0.4 ms
Lead Channel Pacing Threshold Pulse Width: 0.4 ms
Lead Channel Sensing Intrinsic Amplitude: 2.25 mV
Lead Channel Setting Pacing Amplitude: 2 V
MDC IDC LEAD IMPLANT DT: 20180808
MDC IDC LEAD IMPLANT DT: 20180808
MDC IDC LEAD LOCATION: 753860
MDC IDC MSMT BATTERY REMAINING LONGEVITY: 141 mo
MDC IDC MSMT LEADCHNL RA IMPEDANCE VALUE: 380 Ohm
MDC IDC MSMT LEADCHNL RA SENSING INTR AMPL: 2.25 mV
MDC IDC MSMT LEADCHNL RV IMPEDANCE VALUE: 418 Ohm
MDC IDC MSMT LEADCHNL RV IMPEDANCE VALUE: 551 Ohm
MDC IDC MSMT LEADCHNL RV SENSING INTR AMPL: 8.75 mV
MDC IDC MSMT LEADCHNL RV SENSING INTR AMPL: 8.75 mV
MDC IDC SET LEADCHNL RV PACING AMPLITUDE: 2.75 V
MDC IDC SET LEADCHNL RV PACING PULSEWIDTH: 0.4 ms
MDC IDC SET LEADCHNL RV SENSING SENSITIVITY: 0.9 mV
MDC IDC STAT BRADY AP VP PERCENT: 0.1 %
MDC IDC STAT BRADY AP VS PERCENT: 94.01 %
MDC IDC STAT BRADY AS VP PERCENT: 0 %
MDC IDC STAT BRADY RA PERCENT PACED: 94.26 %
MDC IDC STAT BRADY RV PERCENT PACED: 0.11 %

## 2018-09-06 ENCOUNTER — Encounter: Payer: Self-pay | Admitting: Internal Medicine

## 2018-09-06 ENCOUNTER — Ambulatory Visit (INDEPENDENT_AMBULATORY_CARE_PROVIDER_SITE_OTHER): Payer: Medicare Other | Admitting: Internal Medicine

## 2018-09-06 VITALS — BP 124/68 | HR 62 | Temp 98.3°F | Ht 62.0 in | Wt 143.5 lb

## 2018-09-06 DIAGNOSIS — I10 Essential (primary) hypertension: Secondary | ICD-10-CM

## 2018-09-06 DIAGNOSIS — I700 Atherosclerosis of aorta: Secondary | ICD-10-CM | POA: Insufficient documentation

## 2018-09-06 DIAGNOSIS — R200 Anesthesia of skin: Secondary | ICD-10-CM | POA: Diagnosis not present

## 2018-09-06 DIAGNOSIS — Z0000 Encounter for general adult medical examination without abnormal findings: Secondary | ICD-10-CM | POA: Diagnosis not present

## 2018-09-06 DIAGNOSIS — G609 Hereditary and idiopathic neuropathy, unspecified: Secondary | ICD-10-CM | POA: Diagnosis not present

## 2018-09-06 DIAGNOSIS — F39 Unspecified mood [affective] disorder: Secondary | ICD-10-CM

## 2018-09-06 DIAGNOSIS — Z7189 Other specified counseling: Secondary | ICD-10-CM | POA: Diagnosis not present

## 2018-09-06 DIAGNOSIS — J439 Emphysema, unspecified: Secondary | ICD-10-CM

## 2018-09-06 DIAGNOSIS — I495 Sick sinus syndrome: Secondary | ICD-10-CM

## 2018-09-06 NOTE — Assessment & Plan Note (Signed)
Not ready to stop smoking---counseled Might benefit from anti muscarinic--but will hold off

## 2018-09-06 NOTE — Assessment & Plan Note (Signed)
Has DNR 

## 2018-09-06 NOTE — Assessment & Plan Note (Signed)
I have personally reviewed the Medicare Annual Wellness questionnaire and have noted 1. The patient's medical and social history 2. Their use of alcohol, tobacco or illicit drugs 3. Their current medications and supplements 4. The patient's functional ability including ADL's, fall risks, home safety risks and hearing or visual             impairment. 5. Diet and physical activities 6. Evidence for depression or mood disorders  The patients weight, height, BMI and visual acuity have been recorded in the chart I have made referrals, counseling and provided education to the patient based review of the above and I have provided the pt with a written personalized care plan for preventive services.  I have provided you with a copy of your personalized plan for preventive services. Please take the time to review along with your updated medication list.  Had flu vaccine Will consider shingrix at pharmacy Prefers no cancer screening Discussed exercise

## 2018-09-06 NOTE — Assessment & Plan Note (Signed)
BP Readings from Last 3 Encounters:  09/06/18 124/68  04/28/18 128/84  10/13/17 (!) 148/70   Good control now

## 2018-09-06 NOTE — Assessment & Plan Note (Signed)
Pacemaker in

## 2018-09-06 NOTE — Progress Notes (Signed)
Subjective:    Patient ID: Heidi Carroll, female    DOB: Dec 17, 1940, 77 y.o.   MRN: [id number]  HPI Here for Medicare wellness visit and follow up of chronic health conditions Reviewed form and advanced directives Reviewed other doctors Walks her dog regularly Still smoking--but not as much No alcohol Vision is fine. 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 acetaminophen (TYLENOL) 500 MG tablet Take 500 mg by mouth daily as needed for moderate pain or headache.    . ALPRAZolam (XANAX) 0.25 MG tablet TAKE 1/2 TO 1 (ONE-HALF TO ONE) TABLET BY MOUTH TWICE DAILY AS NEEDED FOR ANXIETY 60 tablet 0  . aspirin EC 81 MG tablet Take 81 mg by mouth every evening.     . Calcium Carbonate-Vitamin D (CALCIUM 600+D PO) Take 1 tablet by mouth every evening.     . diltiazem (CARDIZEM CD) 120 MG 24 hr capsule Take 1 capsule (120 mg total) daily by mouth. 90 capsule 3  . flecainide (TAMBOCOR) 50 MG tablet TAKE 1 TABLET BY MOUTH TWICE DAILY 180 tablet 1  . gabapentin (NEURONTIN) 600 MG tablet TAKE 1 TABLET BY MOUTH AT BEDTIME 90 tablet 3  . losartan (COZAAR) 50 MG tablet Take 1 tablet (50 mg total) by mouth daily. 90 tablet 2  . mirtazapine  (REMERON) 15 MG tablet Take 1 tablet (15 mg total) at bedtime by mouth. 90 tablet 3  . hydrALAZINE (APRESOLINE) 25 MG tablet Take one tablet (25 mg) by mouth every 6 hours as needed for a blood pressure >170 on the top number (Patient not taking: Reported on 09/06/2018) 30 tablet 3   No current facility-administered medications on file prior to visit.     Allergies  Allergen Reactions  . Cymbalta [Duloxetine Hcl] Nausea Only  . Wellbutrin [Bupropion] Other (See Comments)    Elevated blood pressure, anxiety  . Zoloft [Sertraline Hcl] Nausea Only    Severe weight loss   . Nicotine Palpitations    Nicotine Patches   . Penicillins Rash    Has patient had a PCN reaction causing immediate rash, facial/tongue/throat swelling, SOB or lightheadedness with hypotension yes Has patient had a PCN reaction causing severe rash involving mucus membranes or skin necrosis: no Has patient had a PCN reaction that required hospitalization no Has patient had a PCN reaction occurring within the last 10 years: no If all of the above answers are "NO", then may proceed with Cephalosporin use.    Past Medical History:  Diagnosis Date  . A-fib (HCC)   . Depression   . Hypertension   . Neuropathy    idiopathic  . Presence of permanent cardiac pacemaker     Past Surgical History:  Procedure Laterality Date  . A-FLUTTER ABLATION  N/A 02/04/2017   Procedure: A-Flutter Ablation;  Surgeon: Duke Salvia, MD;  Location: [facility] INVASIVE CV LAB;  Service: Cardiovascular;  Laterality: N/A;  . BUNIONECTOMY Bilateral 1995   bilateral   . CATARACT EXTRACTION W/ INTRAOCULAR LENS  IMPLANT, BILATERAL Bilateral   . INSERT / REPLACE / REMOVE PACEMAKER  06/03/2017  . PACEMAKER IMPLANT N/A 06/03/2017   Procedure: Pacemaker Implant;  Surgeon: Duke Salvia, MD;  Location: [facility] INVASIVE CV LAB;  Service: Cardiovascular;  Laterality: N/A;  . THROAT SURGERY  1980s   "nodules in my throat"  . VAGINAL DELIVERY  1963   X 1  .  VAGINAL HYSTERECTOMY  1974    Family History  Problem Relation Age of Onset  . Cancer Father        colon  . Heart attack Brother   . Diabetes Maternal Grandfather     Social History   Socioeconomic History  . Marital status: Widowed    Spouse name: Not on file  . Number of children: 1  . Years of education: Not on file  . Highest education level: Not on file  Occupational History  . Occupation: Retired, [employer]selling psychologist)  Social Needs  . Financial resource strain: Not on file  . Food insecurity:    Worry: Not on file    Inability: Not on file  . Transportation needs:    Medical: Not on file    Non-medical: Not on file  Tobacco Use  . Smoking status: Current Some Day Smoker    Packs/day: 1.00    Years: 60.00    Pack years: 60.00    Types: Cigarettes  . Smokeless tobacco: Never Used  Substance and Sexual Activity  . Alcohol use: No  . Drug use: No  . Sexual activity: Never  Lifestyle  . Physical activity:    Days per week: Not on file    Minutes per session: Not on file  . Stress: Not on file  Relationships  . Social connections:    Talks on phone: Not on file    Gets together: Not on file    Attends religious service: Not on file    Active member of club or organization: Not on file    Attends meetings of clubs or organizations: Not on file    Relationship status: Not on file  . Intimate partner violence:    Fear of current or ex partner: Not on file    Emotionally abused: Not on file    Physically abused: Not on file    Forced sexual activity: Not on file  Other Topics Concern  . Not on file  Social History Narrative   Husband died 2023/08/18      Has living will   Has health care POA--- Chaya Jan, friend   Requests DNR--- form done 12/22/14   No tube feedings if cognitively unaware   Review of Systems Has been constipated--- may go a week before going. Discussed miralax Sleeps okay Wears seat belt Has dentures---but sensitive gag reflex. Has  to remove dentures after a couple of hours No blood in stool No dysuria or hematuria Some urgency if she waits---wears a pad just in case No heartburn or dysphagia No rash or suspicious lesions. No dermatologist    Objective:   Physical Exam  Constitutional: She is oriented to person, place, and time. She appears well-developed. No distress.  HENT:  Mouth/Throat: Oropharynx is clear and moist. No oropharyngeal exudate.  Neck: No thyromegaly present.  Cardiovascular: Normal rate, regular rhythm, normal heart sounds and intact distal pulses. Exam reveals no gallop.  No murmur heard. Respiratory: Effort normal. No respiratory distress. She has no wheezes. She has no rales.  Significantly decreased breath sounds but clear  GI: Soft. There is no tenderness.  Musculoskeletal: She exhibits no edema or tenderness.  Lymphadenopathy:    She has no cervical adenopathy.  Neurological: She is alert and oriented to person, place, and time.  President--- "Lora Havens, Obama, Clinton--Bush" [phone] [id number] Recall 3/3  Skin: No rash noted. No erythema.  Psychiatric: She has a normal mood and affect. Her behavior is normal.           Assessment & Plan:

## 2018-09-06 NOTE — Assessment & Plan Note (Signed)
Intermittent symptoms 

## 2018-09-06 NOTE — Assessment & Plan Note (Signed)
Chronic depression but not MDD now Anxiety is chronic as well Stable--no changes needed

## 2018-09-06 NOTE — Patient Instructions (Signed)
You can try miralax 1 capful in a glass of water every day to keep you more regular.

## 2018-09-06 NOTE — Assessment & Plan Note (Signed)
On x-ray Good distal pulses Discussed stopping smoking Will hold off on statin

## 2018-09-07 LAB — COMPREHENSIVE METABOLIC PANEL
ALT: 8 U/L (ref 0–35)
AST: 15 U/L (ref 0–37)
Albumin: 4.2 g/dL (ref 3.5–5.2)
Alkaline Phosphatase: 87 U/L (ref 39–117)
BILIRUBIN TOTAL: 0.3 mg/dL (ref 0.2–1.2)
BUN: 17 mg/dL (ref 6–23)
CO2: 30 meq/L (ref 19–32)
Calcium: 9.6 mg/dL (ref 8.4–10.5)
Chloride: 103 mEq/L (ref 96–112)
Creatinine, Ser: 0.69 mg/dL (ref 0.40–1.20)
GFR: 87.85 mL/min (ref >60.00)
Glucose, Bld: 90 mg/dL (ref 70–99)
Potassium: 4.8 mEq/L (ref 3.5–5.1)
SODIUM: 140 meq/L (ref 135–145)
Total Protein: 7 g/dL (ref 6.0–8.3)

## 2018-09-07 LAB — VITAMIN B12: VITAMIN B 12: 395 pg/mL (ref 211–911)

## 2018-09-07 LAB — CBC
HEMATOCRIT: 47.7 % — AB (ref 36.0–46.0)
Hemoglobin: 16.5 g/dL — ABNORMAL HIGH (ref 12.0–15.0)
MCHC: 34.5 g/dL (ref 30.0–36.0)
MCV: 97.5 fl (ref 78.0–100.0)
PLATELETS: 210 10*3/uL (ref 150.0–400.0)
RBC: 4.89 Mil/uL (ref 3.87–5.11)
RDW: 13 % (ref 11.5–15.5)
WBC: 9.7 10*3/uL (ref 4.0–10.5)

## 2018-09-07 LAB — T4, FREE: Free T4: 0.7 ng/dL (ref 0.60–1.60)

## 2018-09-10 ENCOUNTER — Other Ambulatory Visit: Payer: Self-pay

## 2018-09-10 ENCOUNTER — Other Ambulatory Visit: Payer: Self-pay | Admitting: Internal Medicine

## 2018-09-29 ENCOUNTER — Telehealth: Payer: Self-pay | Admitting: Internal Medicine

## 2018-09-29 NOTE — Telephone Encounter (Signed)
Pt called requesting a refill on the Xanax. Pt stated the pharmacy faxed a request over on Monday 09/27/18. [facility] [address]

## 2018-09-29 NOTE — Telephone Encounter (Signed)
No fax was ever received. Request sent to Dr Alphonsus SiasLetvak.  Last filled 05-14-18 #60 Last OV 09-06-18 Next OV 09-08-19 [facility] [address]

## 2018-09-30 MED ORDER — ALPRAZOLAM 0.25 MG PO TABS: ORAL_TABLET | ORAL | 0 refills | Status: DC

## 2018-10-01 ENCOUNTER — Other Ambulatory Visit: Payer: Self-pay | Admitting: Internal Medicine

## 2018-10-01 ENCOUNTER — Telehealth: Payer: Self-pay | Admitting: Internal Medicine

## 2018-10-01 MED ORDER — MIRTAZAPINE 15 MG PO TABS: 15.0000 mg | ORAL_TABLET | Freq: Every day | ORAL | 3 refills | Status: DC

## 2018-10-01 NOTE — Telephone Encounter (Signed)
Pt need refill for mirtazapine 15mg      [facility]

## 2018-10-01 NOTE — Telephone Encounter (Signed)
Rx sent electronically.  

## 2018-10-12 ENCOUNTER — Encounter: Payer: Self-pay | Admitting: Internal Medicine

## 2018-10-12 ENCOUNTER — Ambulatory Visit (INDEPENDENT_AMBULATORY_CARE_PROVIDER_SITE_OTHER): Payer: Medicare Other | Admitting: Internal Medicine

## 2018-10-12 ENCOUNTER — Ambulatory Visit (INDEPENDENT_AMBULATORY_CARE_PROVIDER_SITE_OTHER): Payer: Medicare Other

## 2018-10-12 VITALS — BP 141/76 | HR 62 | Ht 62.5 in | Wt 143.2 lb

## 2018-10-12 DIAGNOSIS — Z950 Presence of cardiac pacemaker: Secondary | ICD-10-CM

## 2018-10-12 DIAGNOSIS — I483 Typical atrial flutter: Secondary | ICD-10-CM

## 2018-10-12 DIAGNOSIS — I495 Sick sinus syndrome: Secondary | ICD-10-CM | POA: Diagnosis not present

## 2018-10-12 MED ORDER — FLECAINIDE ACETATE 50 MG PO TABS: 50.0000 mg | ORAL_TABLET | Freq: Two times a day (BID) | ORAL | 0 refills | Status: DC

## 2018-10-12 MED ORDER — FLECAINIDE ACETATE 50 MG PO TABS: 50.0000 mg | ORAL_TABLET | Freq: Two times a day (BID) | ORAL | 2 refills | Status: DC

## 2018-10-12 MED ORDER — HYDRALAZINE HCL 25 MG PO TABS: ORAL_TABLET | ORAL | 0 refills | Status: DC

## 2018-10-12 NOTE — Progress Notes (Signed)
Patient Care Team: Karie SchwalbeLetvak, Richard I, MD as PCP - General (Internal Medicine)   HPI  Heidi Carroll is a 77 y.o. female Seen in follow-up for atrial flutter for which she underwent catheter ablation 4/18. She also has sinus node dysfunction and underwent pacing 8/18 Medtronic   Echocardiogram 1/18 EF normal 55-65%. Palpitations>> underwent Holter monitoring>>> PVC                       Non sustained atrial tachycardia  180 bpm but only 4 beats                       There is some suggestion of atrial tach, not fast about 110    On flecainide for her palpitations.  Much improved  DATE PR interval QRSduration Dose-Flecainide  5/18  132 90 0  8/18 NA 88 50  11/18 Ap 238 98 50  12/19 Ap 220 88 50        She denies shortness of breath chest pain or edema.  Her biggest complaint is fatigue.  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 A-fib (HCC)   . Depression   . Hypertension   . Neuropathy    idiopathic  . Presence of permanent cardiac pacemaker     Past Surgical History:  Procedure Laterality Date  . A-FLUTTER ABLATION N/A 02/04/2017   Procedure: A-Flutter Ablation;  Surgeon: Duke Salvia C , MD;  [facility];  Service: Cardiovascular;  Laterality: N/A;  . BUNIONECTOMY Bilateral 1995   bilateral   . CATARACT EXTRACTION W/ INTRAOCULAR LENS  IMPLANT, BILATERAL Bilateral   . INSERT / REPLACE / REMOVE PACEMAKER  06/03/2017  . PACEMAKER IMPLANT N/A 06/03/2017   Procedure: Pacemaker Implant;  Surgeon: Duke Salvia,  C, MD;  [facility];  Service: Cardiovascular;  Laterality: N/A;  . THROAT SURGERY  1980s   "nodules in my throat"  . VAGINAL DELIVERY  1963   X 1  . VAGINAL HYSTERECTOMY  1974    Current Outpatient Medications  Medication Sig Dispense Refill  . acetaminophen (TYLENOL) 500 MG tablet Take 500 mg by mouth daily as needed for moderate pain or headache.    . ALPRAZolam (XANAX) 0.25  MG tablet 1/2 to 1 by mouth twice a day as needed 60 tablet 0  . aspirin EC 81 MG tablet Take 81 mg by mouth every evening.     . Calcium Carbonate-Vitamin D (CALCIUM 600+D PO) Take 1 tablet by mouth every evening.     Marland Kitchen. CARTIA XT 120 MG 24 hr capsule TAKE 1 CAPSULE BY MOUTH ONCE DAILY 30 capsule 1  . flecainide (TAMBOCOR) 50 MG tablet Take 1 tablet (50 mg total) by mouth 2 (two) times daily. 180 tablet 0  . gabapentin (NEURONTIN) 600 MG tablet TAKE 1 TABLET BY MOUTH AT BEDTIME 90 tablet 3  . hydrALAZINE (APRESOLINE) 25 MG tablet Take one tablet (25 mg) by mouth every 6 hours as needed for a blood pressure >170 on the top number 90 tablet 0  . mirtazapine (REMERON) 15 MG tablet Take 1 tablet (15 mg total) by mouth at bedtime. 90 tablet 3  . losartan (COZAAR) 50 MG tablet Take 1 tablet (50 mg total) by mouth daily. 90 tablet 2   No current facility-administered medications for this visit.     Allergies  Allergen Reactions  . Cymbalta [  Duloxetine Hcl] Nausea Only  . Wellbutrin [Bupropion] Other (See Comments)    Elevated blood pressure, anxiety  . Zoloft [Sertraline Hcl] Nausea Only    Severe weight loss   . Nicotine Palpitations    Nicotine Patches   . Penicillins Rash    Has patient had a PCN reaction causing immediate rash, facial/tongue/throat swelling, SOB or lightheadedness with hypotension yes Has patient had a PCN reaction causing severe rash involving mucus membranes or skin necrosis: no Has patient had a PCN reaction that required hospitalization no Has patient had a PCN reaction occurring within the last 10 years: no If all of the above answers are "NO", then may proceed with Cephalosporin use.      Review of Systems negative except from HPI and PMH  Physical Exam BP (!) 141/76 (BP Location: Right Arm, Patient Position: Sitting, Cuff Size: Normal)   Pulse 62   Ht 5' 2.5" (1.588 m)   Wt 143 lb 4 oz (65 kg)   BMI 25.78 kg/m  Well developed and nourished in no acute  distress HENT normal Neck supple with JVP-flat Clear Regular rate and rhythm, no murmurs or gallops Abd-soft with active BS No Clubbing cyanosis edema Skin-warm and dry A & Oriented  Grossly normal sensory and motor function   ECG Apacing 22/09/43    Assessment and  Plan  Atrial flutter status post ablation  AFib paroxysmal/SCAF   Palpitations with PVCs and PACs  Hypertension  Anxiety  /depression  Fatigue  Pacemaker Medtronic  The patient's device was interrogated.  The information was reviewed. No changes were made in the programming.     Tolerating flecainide without conduction system changes on ECG; palpitations much improved  Fatigue may be related to her depression; also wonder whether is related to medications.  We will undertake a serial exclusion trial, Cardizem first, Neurontin 600--300, and then flecainide.  We will adjust each for a month.  She will resume if no changes over that month.  Otherwise she will let us know.  Atrial fibrillation burden is less than 24 hours.  We will continue to monitor without anticoagulation based on ASSERT TRIAL  We will see her again in 6 months.  We spent more than 50% of our >25 min visit in face to face counseling regarding the above

## 2018-10-12 NOTE — Progress Notes (Signed)
Remote pacemaker transmission.   

## 2018-10-12 NOTE — Patient Instructions (Signed)
Medication Instructions:  - Your physician has recommended you make the following change in your medication:   Exclusion trial-  1) hold cardizem (diltiazem) for 1 month to see how you feel- if symptoms improve, then call and let us know, if no improvement restart diltiazem and,  2) decrease neurontin (gabapentin) 600 mg- take 1/2 tablet (300 mg) once daily- if symptoms improve, then call and let us know, if no improvement resume 600 mg once daily and,  3) hold flecainide for 1 month to see how you feel, if symptoms improve, then call and let us know, if no improvement restart flecainide and call us  If you need a refill on your cardiac medications before your next appointment, please call your pharmacy.   Lab work: - none ordered  If you have labs (blood work) drawn today and your tests are completely normal, you will receive your results only by: Marland Kitchen. MyChart Message (if you have MyChart) OR . A paper copy in the mail If you have any lab test that is abnormal or we need to change your treatment, we will call you to review the results.  Testing/Procedures: - none ordered  Follow-Up: At [facility], you and your health needs are our priority.  As part of our continuing mission to provide you with exceptional heart care, we have created designated Provider Care Teams.  These Care Teams include your primary Cardiologist (physician) and Advanced Practice Providers (APPs -  Physician Assistants and Nurse Practitioners) who all work together to provide you with the care you need, when you need it. . You will need a follow up appointment in 6 months with Dr. Graciela HusbandsKlein.  Please call our office 2 months in advance to schedule this appointment.   Remote monitoring is used to monitor your Pacemaker of ICD from home. This monitoring reduces the number of office visits required to check your device to one time per year. It allows us to keep an eye on the functioning of your device to ensure it is working  properly. You are scheduled for a device check from home on 01/11/19. You may send your transmission at any time that day. If you have a wireless device, the transmission will be sent automatically. After your physician reviews your transmission, you will receive a postcard with your next transmission date,.   Any Other Special Instructions Will Be Listed Below (If Applicable). - N/A

## 2018-10-13 ENCOUNTER — Encounter: Payer: Self-pay | Admitting: Cardiology

## 2018-11-12 ENCOUNTER — Other Ambulatory Visit: Payer: Self-pay

## 2018-11-12 ENCOUNTER — Telehealth: Payer: Self-pay | Admitting: Internal Medicine

## 2018-11-12 ENCOUNTER — Emergency Department
Admission: EM | Admit: 2018-11-12 | Discharge: 2018-11-13 | Disposition: A | Discharge status: Home / Self Care | Payer: Medicare Other | Attending: Emergency Medicine | Admitting: Emergency Medicine

## 2018-11-12 ENCOUNTER — Emergency Department: Payer: Medicare Other

## 2018-11-12 DIAGNOSIS — M25512 Pain in left shoulder: Secondary | ICD-10-CM | POA: Insufficient documentation

## 2018-11-12 DIAGNOSIS — R002 Palpitations: Secondary | ICD-10-CM | POA: Insufficient documentation

## 2018-11-12 DIAGNOSIS — R079 Chest pain, unspecified: Secondary | ICD-10-CM | POA: Diagnosis not present

## 2018-11-12 DIAGNOSIS — Z950 Presence of cardiac pacemaker: Secondary | ICD-10-CM | POA: Diagnosis not present

## 2018-11-12 DIAGNOSIS — Z79899 Other long term (current) drug therapy: Secondary | ICD-10-CM | POA: Diagnosis not present

## 2018-11-12 DIAGNOSIS — J449 Chronic obstructive pulmonary disease, unspecified: Secondary | ICD-10-CM | POA: Diagnosis not present

## 2018-11-12 DIAGNOSIS — F17210 Nicotine dependence, cigarettes, uncomplicated: Secondary | ICD-10-CM | POA: Diagnosis not present

## 2018-11-12 DIAGNOSIS — I10 Essential (primary) hypertension: Secondary | ICD-10-CM | POA: Diagnosis not present

## 2018-11-12 DIAGNOSIS — Z7982 Long term (current) use of aspirin: Secondary | ICD-10-CM | POA: Insufficient documentation

## 2018-11-12 LAB — BASIC METABOLIC PANEL
Anion gap: 7 (ref 5–15)
BUN: 11 mg/dL (ref 8–23)
CHLORIDE: 107 mmol/L (ref 98–111)
CO2: 27 mmol/L (ref 22–32)
Calcium: 9.4 mg/dL (ref 8.9–10.3)
Creatinine, Ser: 0.63 mg/dL (ref 0.44–1.00)
GFR calc Af Amer: >60 mL/min (ref >60)
GFR calc non Af Amer: >60 mL/min (ref >60)
Glucose, Bld: 153 mg/dL — ABNORMAL HIGH (ref 70–99)
Potassium: 3.8 mmol/L (ref 3.5–5.1)
Sodium: 141 mmol/L (ref 135–145)

## 2018-11-12 LAB — HEPATIC FUNCTION PANEL
ALT: 11 U/L (ref 0–44)
AST: 17 U/L (ref 15–41)
Albumin: 4.1 g/dL (ref 3.5–5.0)
Alkaline Phosphatase: 92 U/L (ref 38–126)
Bilirubin, Direct: <0.1 mg/dL (ref 0.0–0.2)
TOTAL PROTEIN: 7.3 g/dL (ref 6.5–8.1)
Total Bilirubin: 0.6 mg/dL (ref 0.3–1.2)

## 2018-11-12 LAB — CBC
HCT: 50.2 % — ABNORMAL HIGH (ref 36.0–46.0)
Hemoglobin: 16.3 g/dL — ABNORMAL HIGH (ref 12.0–15.0)
MCH: 32.7 pg (ref 26.0–34.0)
MCHC: 32.5 g/dL (ref 30.0–36.0)
MCV: 100.6 fL — ABNORMAL HIGH (ref 80.0–100.0)
Platelets: 198 10*3/uL (ref 150–400)
RBC: 4.99 MIL/uL (ref 3.87–5.11)
RDW: 12.6 % (ref 11.5–15.5)
WBC: 9.5 10*3/uL (ref 4.0–10.5)
nRBC: 0 % (ref 0.0–0.2)

## 2018-11-12 LAB — TROPONIN I
Troponin I: <0.03 ng/mL (ref <0.03)
Troponin I: <0.03 ng/mL (ref <0.03)

## 2018-11-12 LAB — BRAIN NATRIURETIC PEPTIDE: B Natriuretic Peptide: 85 pg/mL (ref 0.0–100.0)

## 2018-11-12 MED ORDER — LIDOCAINE HCL (PF) 1 % IJ SOLN
5.0000 mL | Freq: Once | INTRAMUSCULAR | Status: AC
  Administered 2018-11-13: 5 mL via INTRADERMAL
  Filled 2018-11-12: qty 5

## 2018-11-12 MED ORDER — KETOROLAC TROMETHAMINE 30 MG/ML IJ SOLN
15.0000 mg | Freq: Once | INTRAMUSCULAR | Status: AC
  Administered 2018-11-12: 15 mg via INTRAVENOUS
  Filled 2018-11-12: qty 1

## 2018-11-12 NOTE — ED Triage Notes (Signed)
Pt states that she started feeling bad yesterday, states that she is having a fluttery sensation In her chest and having left arm pain, pt states that she has a pacemaker, states the pain is worst today, and notices that she is having increased work of breathing today. Pt states that the pain is deep in her left upper arm

## 2018-11-12 NOTE — ED Notes (Signed)
Pacemaker interrogated. Pt denies further needs.

## 2018-11-12 NOTE — Telephone Encounter (Signed)
Pt c/o of Chest Pain: STAT if CP now or developed within 24 hours  1. Are you having CP right now? No pain in chest, just in left arm , and occasional flutter  2. Are you experiencing any other symptoms (ex. SOB, nausea, vomiting, sweating)? SOB  3. How long have you been experiencing CP? Since yesterday at noon   4. Is your CP continuous or coming and going? continuous if she moves  5. Have you taken Nitroglycerin? no ?

## 2018-11-12 NOTE — ED Provider Notes (Signed)
[facility] Emergency Department Provider Note  ____________________________________________   First MD Initiated Contact with Patient 11/12/18 2303     (approximate)  I have reviewed the triage vital signs and the nursing notes.   HISTORY  Chief Complaint Chest Pain   HPI Heidi Carroll is a 78 y.o. female self presents to the emergency department with chest pain and left shoulder pain.  Her pain began insidiously yesterday has been slowly progressive and is now worse whenever she ranges her left shoulder.  She has a past medical history of atrial fibrillation and has a pacemaker and she is concerned that her left shoulder pain could represent a problem with her pacemaker or a heart attack.  The pain is worse with movement of the left shoulder and improved with rest.  She denies recent heavy lifting.  She has had no recent manipulation of her pacemaker.  She denies playing with her pacemaker pocket.  No fevers or chills.  Symptoms are not worse with exertion.  No shortness of breath.  No nausea or vomiting.  Her chest pain is not ripping or tearing it does not go straight to her back.  No history of DVT or pulmonary embolism.  She does have a longstanding history of anxiety for which she takes alprazolam daily.    Past Medical History:  Diagnosis Date  . A-fib (HCC)   . Depression   . Hypertension   . Neuropathy    idiopathic  . Presence of permanent cardiac pacemaker     Patient Active Problem List   Diagnosis Date Noted  . Aortic atherosclerosis (HCC) 09/06/2018  . Edema 07/23/2017  . Sinus node dysfunction (HCC) 06/03/2017  . Atrial fibrillation (HCC) 03/02/2017  . Typical atrial flutter (HCC) 02/04/2017  . COPD (chronic obstructive pulmonary disease) (HCC) 11/18/2016  . Mood disorder (HCC) 11/18/2016  . Essential hypertension, benign 11/18/2016  . Nicotine dependence 12/31/2015  . Preventative health care 12/22/2014  . Advance directive  discussed with patient 12/22/2014  . Idiopathic peripheral neuropathy 04/02/2007    Past Surgical History:  Procedure Laterality Date  . A-FLUTTER ABLATION N/A 02/04/2017   Procedure: A-Flutter Ablation;  Surgeon: Duke Salvia, MD;  Location: [facility] INVASIVE CV LAB;  Service: Cardiovascular;  Laterality: N/A;  . BUNIONECTOMY Bilateral 1995   bilateral   . CATARACT EXTRACTION W/ INTRAOCULAR LENS  IMPLANT, BILATERAL Bilateral   . INSERT / REPLACE / REMOVE PACEMAKER  06/03/2017  . PACEMAKER IMPLANT N/A 06/03/2017   Procedure: Pacemaker Implant;  Surgeon: Duke Salvia, MD;  Location: [facility] INVASIVE CV LAB;  Service: Cardiovascular;  Laterality: N/A;  . THROAT SURGERY  1980s   "nodules in my throat"  . VAGINAL DELIVERY  1963   X 1  . VAGINAL HYSTERECTOMY  1974    Prior to Admission medications   Medication Sig Start Date End Date Taking? Authorizing Provider  acetaminophen (TYLENOL) 500 MG tablet Take 500 mg by mouth daily as needed for moderate pain or headache.    [provider]  ALPRAZolam Prudy Feeler) 0.25 MG tablet 1/2 to 1 by mouth twice a day as needed 09/30/18   Tillman Abide I, MD  aspirin EC 81 MG tablet Take 81 mg by mouth every evening.     [provider]  Calcium Carbonate-Vitamin D (CALCIUM 600+D PO) Take 1 tablet by mouth every evening.     [provider]  CARTIA XT 120 MG 24 hr capsule TAKE 1 CAPSULE BY MOUTH ONCE DAILY  09/10/18   Duke Salvia, MD  flecainide (TAMBOCOR) 50 MG tablet Take 1 tablet (50 mg total) by mouth 2 (two) times daily. 10/12/18   Duke Salvia, MD  gabapentin (NEURONTIN) 600 MG tablet TAKE 1 TABLET BY MOUTH AT BEDTIME 02/15/18   Karie Schwalbe, MD  hydrALAZINE (APRESOLINE) 25 MG tablet Take one tablet (25 mg) by mouth every 6 hours as needed for a blood pressure >170 on the top number 10/12/18   Duke Salvia, MD  lidocaine (LIDODERM) 5 % Place 1 patch onto the skin every 12 (twelve) hours. Remove & Discard patch within 12  hours or as directed by MD 11/13/18 11/13/19  Merrily Brittle, MD  losartan (COZAAR) 50 MG tablet Take 1 tablet (50 mg total) by mouth daily. 07/02/18 09/06/18  Duke Salvia, MD  mirtazapine (REMERON) 15 MG tablet Take 1 tablet (15 mg total) by mouth at bedtime. 10/01/18   Karie Schwalbe, MD    Allergies Cymbalta [duloxetine hcl]; Wellbutrin [bupropion]; Zoloft [sertraline hcl]; Nicotine; and Penicillins  Family History  Problem Relation Age of Onset  . Cancer Father        colon  . Heart attack Brother   . Diabetes Maternal Grandfather     Social History Social History   Tobacco Use  . Smoking status: Current Some Day Smoker    Packs/day: 1.00    Years: 60.00    Pack years: 60.00    Types: Cigarettes  . Smokeless tobacco: Never Used  Substance Use Topics  . Alcohol use: No  . Drug use: No    Review of Systems Constitutional: No fever/chills Eyes: No visual changes. ENT: No sore throat. Cardiovascular: Positive for chest pain. Respiratory: Denies shortness of breath. Gastrointestinal: No abdominal pain.  No nausea, no vomiting.  No diarrhea.  No constipation. Genitourinary: Negative for dysuria. Musculoskeletal: Positive for shoulder pain Skin: Negative for rash. Neurological: Negative for headaches, focal weakness or numbness.   ____________________________________________   PHYSICAL EXAM:  VITAL SIGNS: ED Triage Vitals  Enc Vitals Group     BP 11/12/18 1652 (!) 144/112     Pulse Rate 11/12/18 1652 71     Resp 11/12/18 1652 (!) 24     Temp 11/12/18 1652 98 F (36.7 C)     Temp Source 11/12/18 1652 Oral     SpO2 11/12/18 1652 94 %     Weight 11/12/18 1654 140 lb (63.5 kg)     Height 11/12/18 1654 5\' 2"  (1.575 m)     Head Circumference --      Peak Flow --      Pain Score 11/12/18 1653 7     Pain Loc --      Pain Edu? --      Excl. in GC? --     Constitutional: Alert and oriented x4 somewhat anxious appearing although nontoxic no diaphoresis and  speaks in full clear sentences Eyes: PERRL EOMI. Head: Atraumatic. Nose: No congestion/rhinnorhea. Mouth/Throat: No trismus Neck: No stridor.   Cardiovascular: Normal rate, regular rhythm. Grossly normal heart sounds.  Good peripheral circulation.  Pacemaker pocket appears healthy Respiratory: Normal respiratory effort.  No retractions. Lungs CTAB and moving good air Gastrointestinal: Soft nontender Musculoskeletal: Full range of motion of the left shoulder with no bony tenderness but she is exquisitely tender focally over the distal portion of her deltoid on the left Neurologic:  Normal speech and language. No gross focal neurologic deficits are appreciated. Skin:  Skin is warm,  dry and intact. No rash noted. Psychiatric: Mood and affect are normal. Speech and behavior are normal.    ____________________________________________   DIFFERENTIAL includes but not limited to  Bursitis, shoulder dislocation, tendinitis, acute coronary syndrome, pacemaker malfunction ____________________________________________   LABS (all labs ordered are listed, but only abnormal results are displayed)  Labs Reviewed  BASIC METABOLIC PANEL - Abnormal; Notable for the following components:      Result Value   Glucose, Bld 153 (*)    All other components within normal limits  CBC - Abnormal; Notable for the following components:   Hemoglobin 16.3 (*)    HCT 50.2 (*)    MCV 100.6 (*)    All other components within normal limits  TROPONIN I  BRAIN NATRIURETIC PEPTIDE  TROPONIN I  HEPATIC FUNCTION PANEL    Lab work reviewed by me with no signs of acute ischemia x2 __________________________________________  EKG  ED ECG REPORT I, Merrily BrittleNeil Greysyn Vanderberg, the attending physician, personally viewed and interpreted this ECG.  Date: 11/12/2018 EKG Time:  Rate: 75 Rhythm: Atrial paced rhythm QRS Axis: normal Intervals: normal ST/T Wave abnormalities: normal Narrative Interpretation: no evidence of  acute ischemia.  Unchanged morphology compared to previous EKG 10/12/2018  ____________________________________________  RADIOLOGY  Chest x-ray reviewed by me with no acute disease Shoulder x-ray reviewed by me with no acute disease ____________________________________________   PROCEDURES  Procedure(s) performed: Yes  .Nerve Block Date/Time: 11/13/2018 12:04 AM Performed by: Merrily Brittleifenbark, Pete Merten, MD Authorized by: Merrily Brittleifenbark, Srihaan Mastrangelo, MD   Consent:    Consent obtained:  Verbal   Consent given by:  Patient   Risks discussed:  Pain, unsuccessful block, swelling, infection and bleeding   Alternatives discussed:  Alternative treatment Indications:    Indications:  Pain relief Location:    Body area:  Upper extremity   Upper extremity nerve blocked: Trigger point injections to the left shoulder.   Laterality:  Left Pre-procedure details:    Skin preparation:  2% chlorhexidine Procedure details (see MAR for exact dosages):    Block needle gauge:  25 G   Anesthetic injected:  Lidocaine 1% w/o epi   Injection procedure:  Anatomic landmarks identified, negative aspiration for blood, incremental injection and anatomic landmarks palpated   Paresthesia:  None Post-procedure details:    Dressing:  None   Outcome:  Pain relieved   Patient tolerance of procedure:  Tolerated well, no immediate complications Comments:     I performed a total of 4 trigger point injections over the points of maximal tenderness to her left shoulder with near complete resolution of her pain   Critical Care performed: no  ____________________________________________   INITIAL IMPRESSION / ASSESSMENT AND PLAN / ED COURSE  Pertinent labs & imaging results that were available during my care of the patient were reviewed by me and considered in my medical decision making (see chart for details).   As part of my medical decision making, I reviewed the following data within the electronic MEDICAL RECORD NUMBER History  obtained from family if available, nursing notes, old chart and ekg, as well as notes from prior ED visits.  The patient comes to the emergency department with chest pain but most notably left shoulder pain.  She has a significant amount of anxiety and is worried that her left shoulder pain could be related to her pacemaker or her heart.  She has 2- troponins and x-ray of her chest and her shoulder are negative.  We interrogated her pacemaker and there were no  abnormalities noted.  I performed trigger point injections over her left shoulder and gave her intravenous ketorolac with near complete resolution of her symptoms.  She feels significant reassurance and will be discharged home with a Lidoderm patch and strict return precautions.      ____________________________________________   FINAL CLINICAL IMPRESSION(S) / ED DIAGNOSES  Final diagnoses:  Acute pain of left shoulder  Palpitations      NEW MEDICATIONS STARTED DURING THIS VISIT:  Discharge Medication List as of 11/13/2018 12:04 AM    START taking these medications   Details  lidocaine (LIDODERM) 5 % Place 1 patch onto the skin every 12 (twelve) hours. Remove & Discard patch within 12 hours or as directed by MD, Starting Sat 11/13/2018, Until Sun 11/13/2019, Print         Note:  This document was prepared using Dragon voice recognition software and may include unintentional dictation errors.    Merrily Brittleifenbark, Kataleena Holsapple, MD 11/16/18 [phone]

## 2018-11-12 NOTE — ED Notes (Signed)
Report from kassie, rn

## 2018-11-12 NOTE — Telephone Encounter (Signed)
Returned the call to the patient. She stated that she has been having left arm pain since yesterday and a flutter sensation in her chest. She denies chest pain. She denies shortness of breath outside of her norm. The patient did say that she "just didn't feel right." She stated that she would feel better if she went to the ED for further assessment. She has been advised to call if anything further is needed.

## 2018-11-13 DIAGNOSIS — M25512 Pain in left shoulder: Secondary | ICD-10-CM | POA: Diagnosis not present

## 2018-11-13 MED ORDER — LIDOCAINE 5 % EX PTCH: 1.0000 | MEDICATED_PATCH | Freq: Two times a day (BID) | CUTANEOUS | 0 refills | Status: DC

## 2018-11-13 NOTE — Discharge Instructions (Signed)
Fortunately today your lab work and your x-rays were very reassuring.  Please use your lidocaine patch as needed for severe symptoms and make an appointment to follow-up with your primary care physician.  Return to the emergency department sooner for any concerns.  It was a pleasure to take care of you today, and thank you for coming to our emergency department.  If you have any questions or concerns before leaving please ask the nurse to grab me and I'm more than happy to go through your aftercare instructions again.  If you were prescribed any opioid pain medication today such as Norco, Vicodin, Percocet, morphine, hydrocodone, or oxycodone please make sure you do not drive when you are taking this medication as it can alter your ability to drive safely.  If you have any concerns once you are home that you are not improving or are in fact getting worse before you can make it to your follow-up appointment, please do not hesitate to call 911 and come back for further evaluation.  Merrily Brittle, MD  Results for orders placed or performed during the hospital encounter of 11/12/18  Basic metabolic panel  Result Value Ref Range   Sodium 141 135 - 145 mmol/L   Potassium 3.8 3.5 - 5.1 mmol/L   Chloride 107 98 - 111 mmol/L   CO2 27 22 - 32 mmol/L   Glucose, Bld 153 (H) 70 - 99 mg/dL   BUN 11 8 - 23 mg/dL   Creatinine, Ser 4.88 0.44 - 1.00 mg/dL   Calcium 9.4 8.9 - 89.1 mg/dL   GFR calc non Af Amer >60 >60 mL/min   GFR calc Af Amer >60 >60 mL/min   Anion gap 7 5 - 15  CBC  Result Value Ref Range   WBC 9.5 4.0 - 10.5 K/uL   RBC 4.99 3.87 - 5.11 MIL/uL   Hemoglobin 16.3 (H) 12.0 - 15.0 g/dL   HCT 69.4 (H) 50.3 - 88.8 %   MCV 100.6 (H) 80.0 - 100.0 fL   MCH 32.7 26.0 - 34.0 pg   MCHC 32.5 30.0 - 36.0 g/dL   RDW 28.0 03.4 - 91.7 %   Platelets 198 150 - 400 K/uL   nRBC 0.0 0.0 - 0.2 %  Troponin I - ONCE - STAT  Result Value Ref Range   Troponin I <0.03 <0.03 ng/mL  Brain natriuretic  peptide  Result Value Ref Range   B Natriuretic Peptide 85.0 0.0 - 100.0 pg/mL  Troponin I - Once  Result Value Ref Range   Troponin I <0.03 <0.03 ng/mL  Hepatic function panel  Result Value Ref Range   Total Protein 7.3 6.5 - 8.1 g/dL   Albumin 4.1 3.5 - 5.0 g/dL   AST 17 15 - 41 U/L   ALT 11 0 - 44 U/L   Alkaline Phosphatase 92 38 - 126 U/L   Total Bilirubin 0.6 0.3 - 1.2 mg/dL   Bilirubin, Direct <9.1 0.0 - 0.2 mg/dL   Indirect Bilirubin NOT CALCULATED 0.3 - 0.9 mg/dL   Dg Chest 2 View  Result Date: 11/12/2018 CLINICAL DATA:  Left arm and chest pain. EXAM: CHEST - 2 VIEW COMPARISON:  07/26/2017 FINDINGS: The lungs are hyperinflated likely secondary to COPD. There is no focal parenchymal opacity. There is no pleural effusion or pneumothorax. The heart and mediastinal contours are unremarkable. There is a dual lead cardiac pacemaker. The osseous structures are unremarkable. IMPRESSION: No active cardiopulmonary disease. Electronically Signed   By: Alan Ripper  Patel   On: 11/12/2018 17:22   Dg Shoulder Left Portable  Result Date: 11/12/2018 CLINICAL DATA:  78 year old female with left shoulder pain. No known injury. EXAM: LEFT SHOULDER - 1 VIEW COMPARISON:  Chest radiograph dated 11/12/2018 FINDINGS: There is no acute fracture or dislocation. There is mild osteopenia. No significant arthritic changes. AV pacemaker device is noted. The soft tissues are unremarkable. IMPRESSION: Negative. Electronically Signed   By: Elgie CollardArash  Radparvar M.D.   On: 11/12/2018 23:50

## 2018-11-14 LAB — CUP PACEART REMOTE DEVICE CHECK
Battery Remaining Longevity: 137 mo
Battery Voltage: 3.04 V
Brady Statistic AP VP Percent: 0.11 %
Brady Statistic AS VP Percent: 0 %
Brady Statistic AS VS Percent: 2.3 %
Brady Statistic RA Percent Paced: 97.76 %
Brady Statistic RV Percent Paced: 0.11 %
Date Time Interrogation Session: 20191217052458
Implantable Lead Implant Date: 20180808
Implantable Lead Location: 753859
Implantable Lead Location: 753860
Implantable Lead Model: 3830
Implantable Lead Model: 5076
Implantable Pulse Generator Implant Date: 20180808
Lead Channel Impedance Value: 285 Ohm
Lead Channel Impedance Value: 361 Ohm
Lead Channel Impedance Value: 418 Ohm
Lead Channel Impedance Value: 532 Ohm
Lead Channel Pacing Threshold Amplitude: 0.75 V
Lead Channel Pacing Threshold Amplitude: 1.25 V
Lead Channel Pacing Threshold Pulse Width: 0.4 ms
Lead Channel Pacing Threshold Pulse Width: 0.4 ms
Lead Channel Sensing Intrinsic Amplitude: 2.625 mV
Lead Channel Sensing Intrinsic Amplitude: 2.625 mV
Lead Channel Sensing Intrinsic Amplitude: 7 mV
Lead Channel Setting Pacing Amplitude: 2 V
Lead Channel Setting Pacing Pulse Width: 0.4 ms
Lead Channel Setting Sensing Sensitivity: 0.9 mV
MDC IDC LEAD IMPLANT DT: 20180808
MDC IDC MSMT LEADCHNL RV SENSING INTR AMPL: 7 mV
MDC IDC SET LEADCHNL RV PACING AMPLITUDE: 2.5 V
MDC IDC STAT BRADY AP VS PERCENT: 97.59 %

## 2018-11-16 ENCOUNTER — Ambulatory Visit (INDEPENDENT_AMBULATORY_CARE_PROVIDER_SITE_OTHER): Payer: Medicare Other | Admitting: Internal Medicine

## 2018-11-16 ENCOUNTER — Encounter: Payer: Self-pay | Admitting: Internal Medicine

## 2018-11-16 VITALS — BP 136/82 | HR 70 | Temp 97.8°F | Ht 62.5 in | Wt 144.0 lb

## 2018-11-16 DIAGNOSIS — M7582 Other shoulder lesions, left shoulder: Secondary | ICD-10-CM | POA: Diagnosis not present

## 2018-11-16 DIAGNOSIS — M778 Other enthesopathies, not elsewhere classified: Secondary | ICD-10-CM | POA: Insufficient documentation

## 2018-11-16 NOTE — Patient Instructions (Signed)
Please try ice on the painful spot in your shoulder. You can try ibuprofen ---up to 400mg  (2 tabs) three times a day with food---for the next 1-2 weeks to see if that helps.

## 2018-11-16 NOTE — Progress Notes (Signed)
Subjective:    Patient ID: Heidi Carroll, female    DOB: 1940-12-06, 78 y.o.   MRN: [id number]  HPI Here for ER follow up---left shoulder pain Was worried it could be related to her heart--recommended by Dr Odessa Fleming office  Has trouble abducting shoulder  Pain from shoulder along lateral arm Had noticed some minor symptoms---then really worsened Heat does help No new tasks Still just volunteers with [facility] the [facility]  Brief help with nerve block in ER (just as bad the next morning) Using lidocaine patch---helps for a few hours  Current Outpatient Medications on File Prior to Visit  Medication Sig Dispense Refill  . acetaminophen (TYLENOL) 500 MG tablet Take 500 mg by mouth daily as needed for moderate pain or headache.    . ALPRAZolam (XANAX) 0.25 MG tablet 1/2 to 1 by mouth twice a day as needed 60 tablet 0  . aspirin EC 81 MG tablet Take 81 mg by mouth every evening.     . Calcium Carbonate-Vitamin D (CALCIUM 600+D PO) Take 1 tablet by mouth every evening.     Marland Kitchen CARTIA XT 120 MG 24 hr capsule TAKE 1 CAPSULE BY MOUTH ONCE DAILY 30 capsule 1  . flecainide (TAMBOCOR) 50 MG tablet Take 1 tablet (50 mg total) by mouth 2 (two) times daily. 180 tablet 2  . gabapentin (NEURONTIN) 600 MG tablet TAKE 1 TABLET BY MOUTH AT BEDTIME 90 tablet 3  . hydrALAZINE (APRESOLINE) 25 MG tablet Take one tablet (25 mg) by mouth every 6 hours as needed for a blood pressure >170 on the top number 90 tablet 0  . Lido-Capsaicin-Men-Methyl Sal (MEDI-PATCH-LIDOCAINE EX) Apply topically.    . mirtazapine (REMERON) 15 MG tablet Take 1 tablet (15 mg total) by mouth at bedtime. 90 tablet 3  . losartan (COZAAR) 50 MG tablet Take 1 tablet (50 mg total) by mouth daily. 90 tablet 2   No current facility-administered medications on file prior to visit.     Allergies  Allergen Reactions  . Cymbalta [Duloxetine Hcl] Nausea Only  . Wellbutrin [Bupropion] Other (See Comments)    Elevated blood pressure, anxiety    . Zoloft [Sertraline Hcl] Nausea Only    Severe weight loss   . Nicotine Palpitations    Nicotine Patches   . Penicillins Rash    Has patient had a PCN reaction causing immediate rash, facial/tongue/throat swelling, SOB or lightheadedness with hypotension yes Has patient had a PCN reaction causing severe rash involving mucus membranes or skin necrosis: no Has patient had a PCN reaction that required hospitalization no Has patient had a PCN reaction occurring within the last 10 years: no If all of the above answers are "NO", then may proceed with Cephalosporin use.    Past Medical History:  Diagnosis Date  . A-fib (HCC)   . Depression   . Hypertension   . Neuropathy    idiopathic  . Presence of permanent cardiac pacemaker     Past Surgical History:  Procedure Laterality Date  . A-FLUTTER ABLATION N/A 02/04/2017   Procedure: A-Flutter Ablation;  Surgeon: Duke Salvia, MD;  Location: [facility] INVASIVE CV LAB;  Service: Cardiovascular;  Laterality: N/A;  . BUNIONECTOMY Bilateral 1995   bilateral   . CATARACT EXTRACTION W/ INTRAOCULAR LENS  IMPLANT, BILATERAL Bilateral   . INSERT / REPLACE / REMOVE PACEMAKER  06/03/2017  . PACEMAKER IMPLANT N/A 06/03/2017   Procedure: Pacemaker Implant;  Surgeon: Duke Salvia, MD;  Location: [facility] INVASIVE CV LAB;  Service: Cardiovascular;  Laterality: N/A;  . THROAT SURGERY  1980s   "nodules in my throat"  . VAGINAL DELIVERY  1963   X 1  . VAGINAL HYSTERECTOMY  1974    Family History  Problem Relation Age of Onset  . Cancer Father        colon  . Heart attack Brother   . Diabetes Maternal Grandfather     Social History   Socioeconomic History  . Marital status: Widowed    Spouse name: Not on file  . Number of children: 1  . Years of education: Not on file  . Highest education level: Not on file  Occupational History  . Occupation: Retired, [employer] psychologist)  Social Needs  . Financial resource strain: Not on file  . Food  insecurity:    Worry: Not on file    Inability: Not on file  . Transportation needs:    Medical: Not on file    Non-medical: Not on file  Tobacco Use  . Smoking status: Current Some Day Smoker    Packs/day: 1.00    Years: 60.00    Pack years: 60.00    Types: Cigarettes  . Smokeless tobacco: Never Used  Substance and Sexual Activity  . Alcohol use: No  . Drug use: No  . Sexual activity: Never  Lifestyle  . Physical activity:    Days per week: Not on file    Minutes per session: Not on file  . Stress: Not on file  Relationships  . Social connections:    Talks on phone: Not on file    Gets together: Not on file    Attends religious service: Not on file    Active member of club or organization: Not on file    Attends meetings of clubs or organizations: Not on file    Relationship status: Not on file  . Intimate partner violence:    Fear of current or ex partner: Not on file    Emotionally abused: Not on file    Physically abused: Not on file    Forced sexual activity: Not on file  Other Topics Concern  . Not on file  Social History Narrative   Husband died September 06, 2023      Has living will   Has health care POA--- Chaya Jan, friend   Requests DNR--- form done 12/22/14   No tube feedings if cognitively unaware   Review of Systems Not sick No fever Appetite is not great Weight is holding    Objective:   Physical Exam  Constitutional: She appears well-developed. No distress.  Musculoskeletal:     Comments: Left shoulder--no swelling No bursa tenderness Resists active/passive abduction Internal rotation fairly full Pain with any external rotation           Assessment & Plan:

## 2018-11-16 NOTE — Assessment & Plan Note (Signed)
No apparent injury X-ray benign Discussed ice Short course NSAID Consider PT

## 2018-11-23 ENCOUNTER — Other Ambulatory Visit: Payer: Self-pay | Admitting: Internal Medicine

## 2018-11-23 NOTE — Telephone Encounter (Signed)
Please review for refill. Dr. Odessa Fleming last not stated hold diltiazem for one month.I do not see where it was restarted.

## 2018-11-24 NOTE — Telephone Encounter (Signed)
From office note 10/12/18 with Dr Graciela Husbands, patient was to trial off diltiazem to see if her symptoms of fatigue improved and restart if she could not tell a difference. Patient states she tried that and saw no improvement so she is back taking it now. Therefore, refill sent to pharmacy.

## 2018-12-15 LAB — CUP PACEART INCLINIC DEVICE CHECK
Battery Remaining Longevity: 137 mo
Battery Voltage: 3.04 V
Brady Statistic AP VP Percent: 0.08 %
Brady Statistic AP VS Percent: 97.43 %
Brady Statistic AS VS Percent: 2.48 %
Brady Statistic RA Percent Paced: 97.58 %
Brady Statistic RV Percent Paced: 0.09 %
Date Time Interrogation Session: 20191217154229
Implantable Lead Implant Date: 20180808
Implantable Lead Implant Date: 20180808
Implantable Lead Location: 753860
Implantable Lead Model: 3830
Implantable Lead Model: 5076
Implantable Pulse Generator Implant Date: 20180808
Lead Channel Impedance Value: 285 Ohm
Lead Channel Impedance Value: 361 Ohm
Lead Channel Impedance Value: 399 Ohm
Lead Channel Impedance Value: 532 Ohm
Lead Channel Pacing Threshold Amplitude: 0.75 V
Lead Channel Pacing Threshold Amplitude: 1.25 V
Lead Channel Pacing Threshold Pulse Width: 0.4 ms
Lead Channel Pacing Threshold Pulse Width: 0.4 ms
Lead Channel Setting Pacing Amplitude: 2 V
Lead Channel Setting Pacing Amplitude: 2.5 V
Lead Channel Setting Pacing Pulse Width: 0.4 ms
MDC IDC LEAD LOCATION: 753859
MDC IDC MSMT LEADCHNL RV SENSING INTR AMPL: 9.625 mV
MDC IDC SET LEADCHNL RV SENSING SENSITIVITY: 0.9 mV
MDC IDC STAT BRADY AS VP PERCENT: 0 %

## 2019-01-11 ENCOUNTER — Ambulatory Visit (INDEPENDENT_AMBULATORY_CARE_PROVIDER_SITE_OTHER): Payer: Medicare Other | Admitting: *Deleted

## 2019-01-11 ENCOUNTER — Other Ambulatory Visit: Payer: Self-pay

## 2019-01-11 DIAGNOSIS — I495 Sick sinus syndrome: Secondary | ICD-10-CM | POA: Diagnosis not present

## 2019-01-16 LAB — CUP PACEART REMOTE DEVICE CHECK
Battery Voltage: 3.03 V
Brady Statistic AP VP Percent: 0.09 %
Brady Statistic AP VS Percent: 97.61 %
Brady Statistic AS VP Percent: 0 %
Brady Statistic AS VS Percent: 2.29 %
Brady Statistic RA Percent Paced: 97.67 %
Brady Statistic RV Percent Paced: 0.1 %
Date Time Interrogation Session: 20200317172651
Implantable Lead Implant Date: 20180808
Implantable Lead Implant Date: 20180808
Implantable Lead Location: 753860
Implantable Lead Model: 3830
Implantable Lead Model: 5076
Lead Channel Impedance Value: 285 Ohm
Lead Channel Impedance Value: 342 Ohm
Lead Channel Impedance Value: 418 Ohm
Lead Channel Pacing Threshold Amplitude: 0.75 V
Lead Channel Pacing Threshold Amplitude: 1 V
Lead Channel Pacing Threshold Pulse Width: 0.4 ms
Lead Channel Pacing Threshold Pulse Width: 0.4 ms
Lead Channel Sensing Intrinsic Amplitude: 4 mV
Lead Channel Sensing Intrinsic Amplitude: 4 mV
Lead Channel Sensing Intrinsic Amplitude: 7.125 mV
Lead Channel Sensing Intrinsic Amplitude: 7.125 mV
Lead Channel Setting Pacing Amplitude: 2 V
Lead Channel Setting Pacing Amplitude: 2.5 V
Lead Channel Setting Pacing Pulse Width: 0.4 ms
MDC IDC LEAD LOCATION: 753859
MDC IDC MSMT BATTERY REMAINING LONGEVITY: 132 mo
MDC IDC MSMT LEADCHNL RV IMPEDANCE VALUE: 532 Ohm
MDC IDC PG IMPLANT DT: 20180808
MDC IDC SET LEADCHNL RV SENSING SENSITIVITY: 0.9 mV

## 2019-01-19 NOTE — Progress Notes (Signed)
Remote pacemaker transmission.   

## 2019-02-07 ENCOUNTER — Other Ambulatory Visit: Payer: Self-pay | Admitting: Internal Medicine

## 2019-03-14 IMAGING — CR DG CHEST 2V
2 series · 2 of 2 positions shown · non-contrast
Comparison: 02/23/2017 chest radiograph.

CLINICAL DATA: Chest tightness and tachycardia

EXAM:
CHEST  2 VIEW

[chest lat]
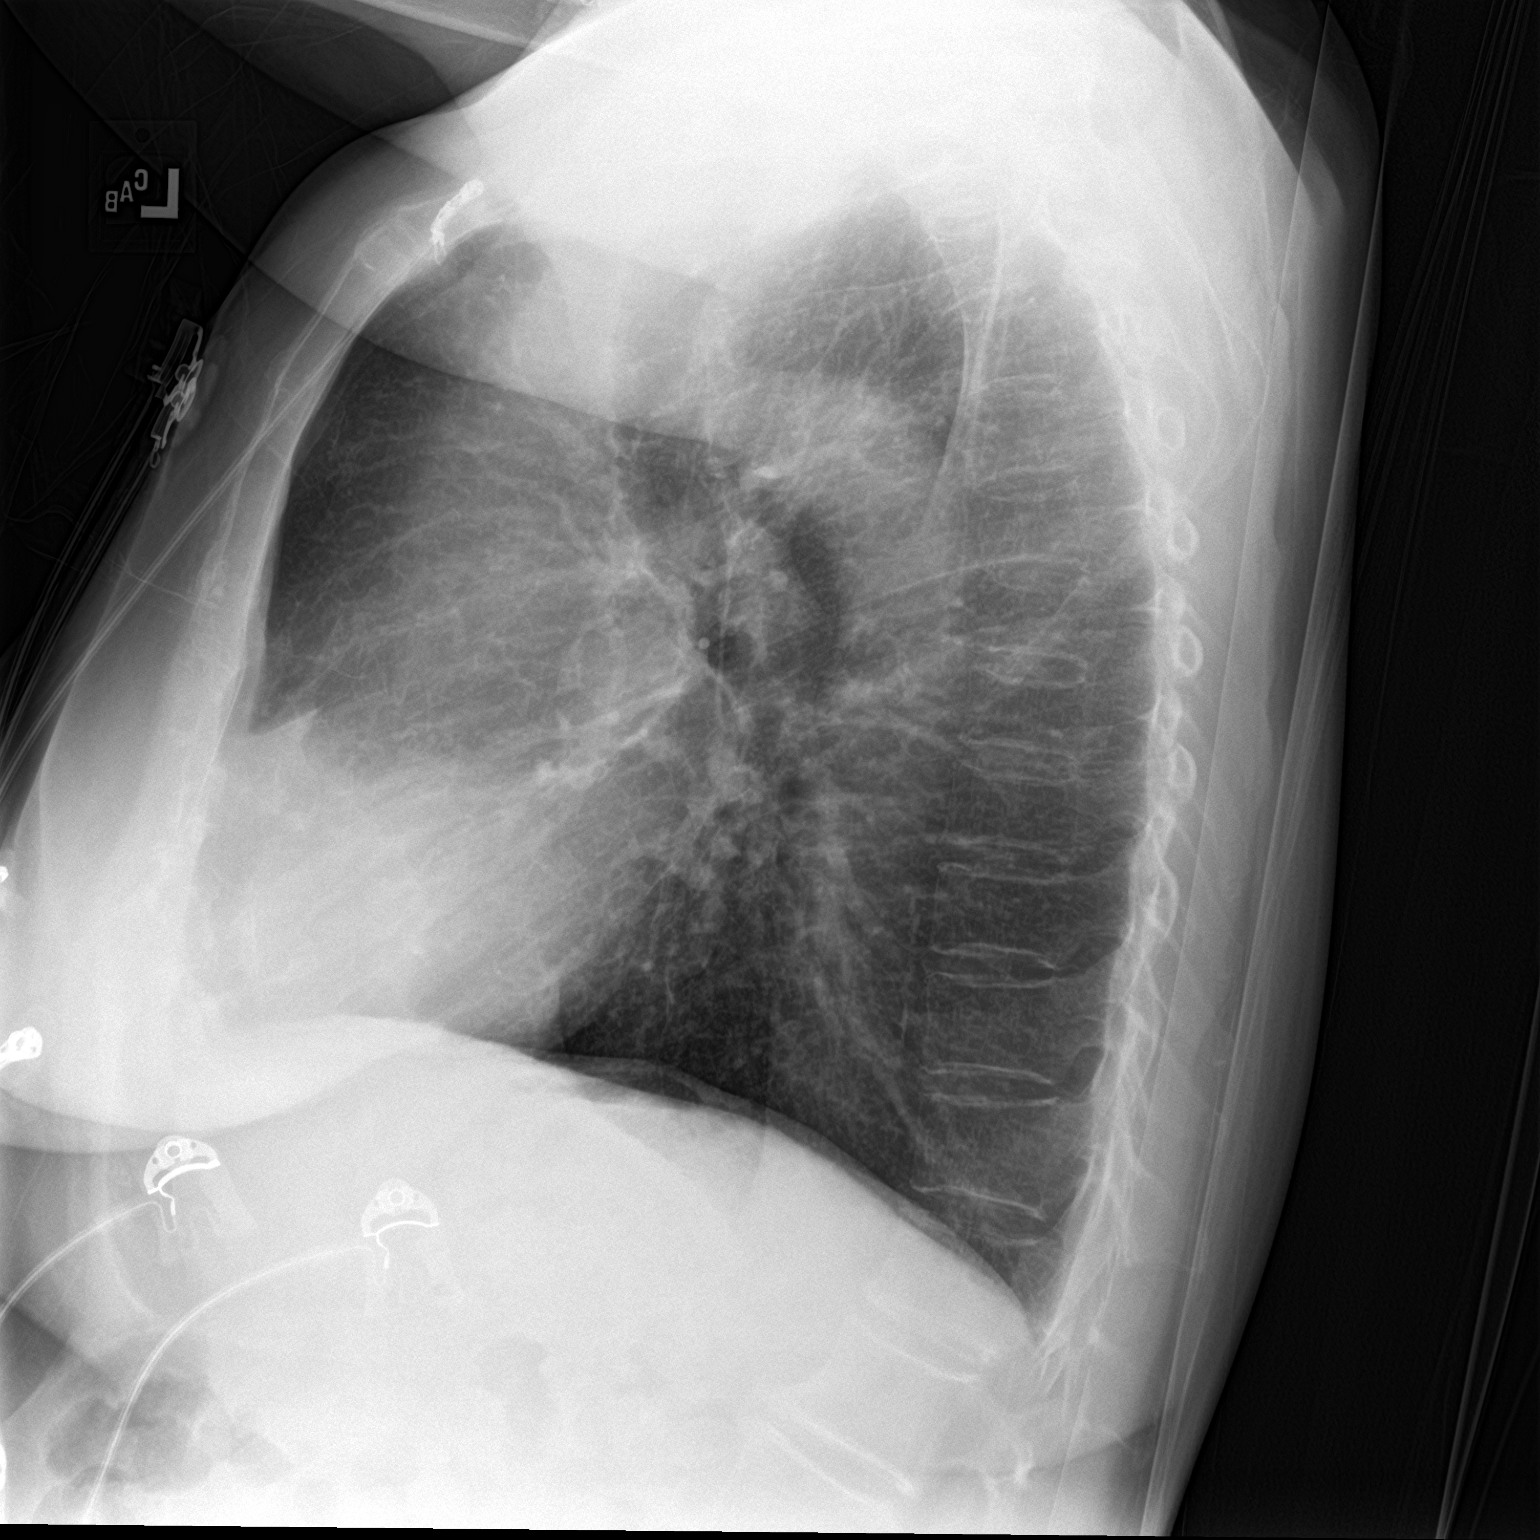

[chest ap]
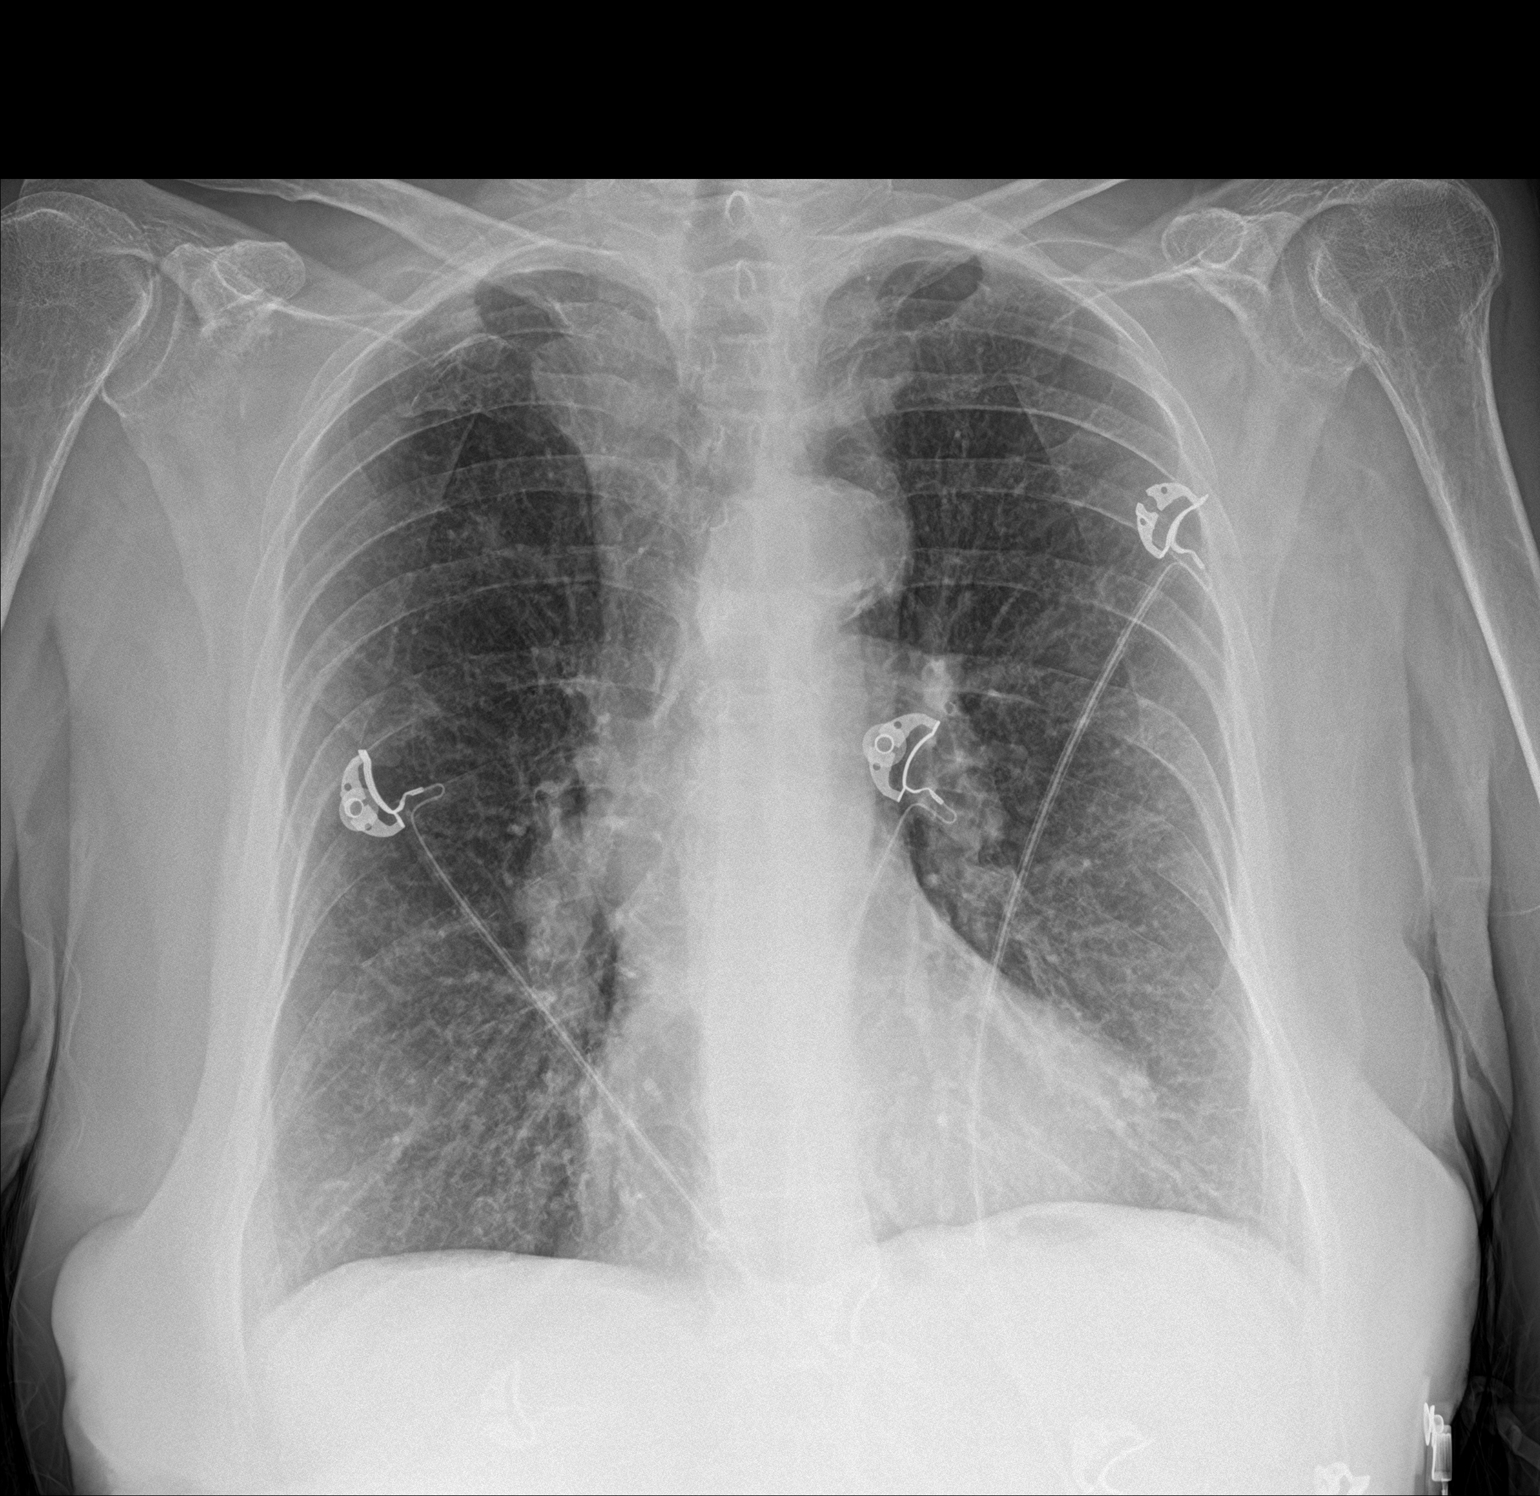

[2 of 2 positions shown; findings below may reference images not displayed]

FINDINGS: Stable cardiomediastinal silhouette with normal heart size and
aortic atherosclerosis. No pneumothorax. No pleural effusion. Lungs
appear clear, with no acute consolidative airspace disease and no
pulmonary edema.
IMPRESSION: No active cardiopulmonary disease.  Aortic atherosclerosis.

## 2019-03-17 IMAGING — CR DG CHEST 2V
2 series · 2 of 2 positions shown · non-contrast
Comparison: 03/01/2017

CLINICAL DATA: Chest pain and hypertension.

EXAM:
CHEST  2 VIEW

[chest pa]
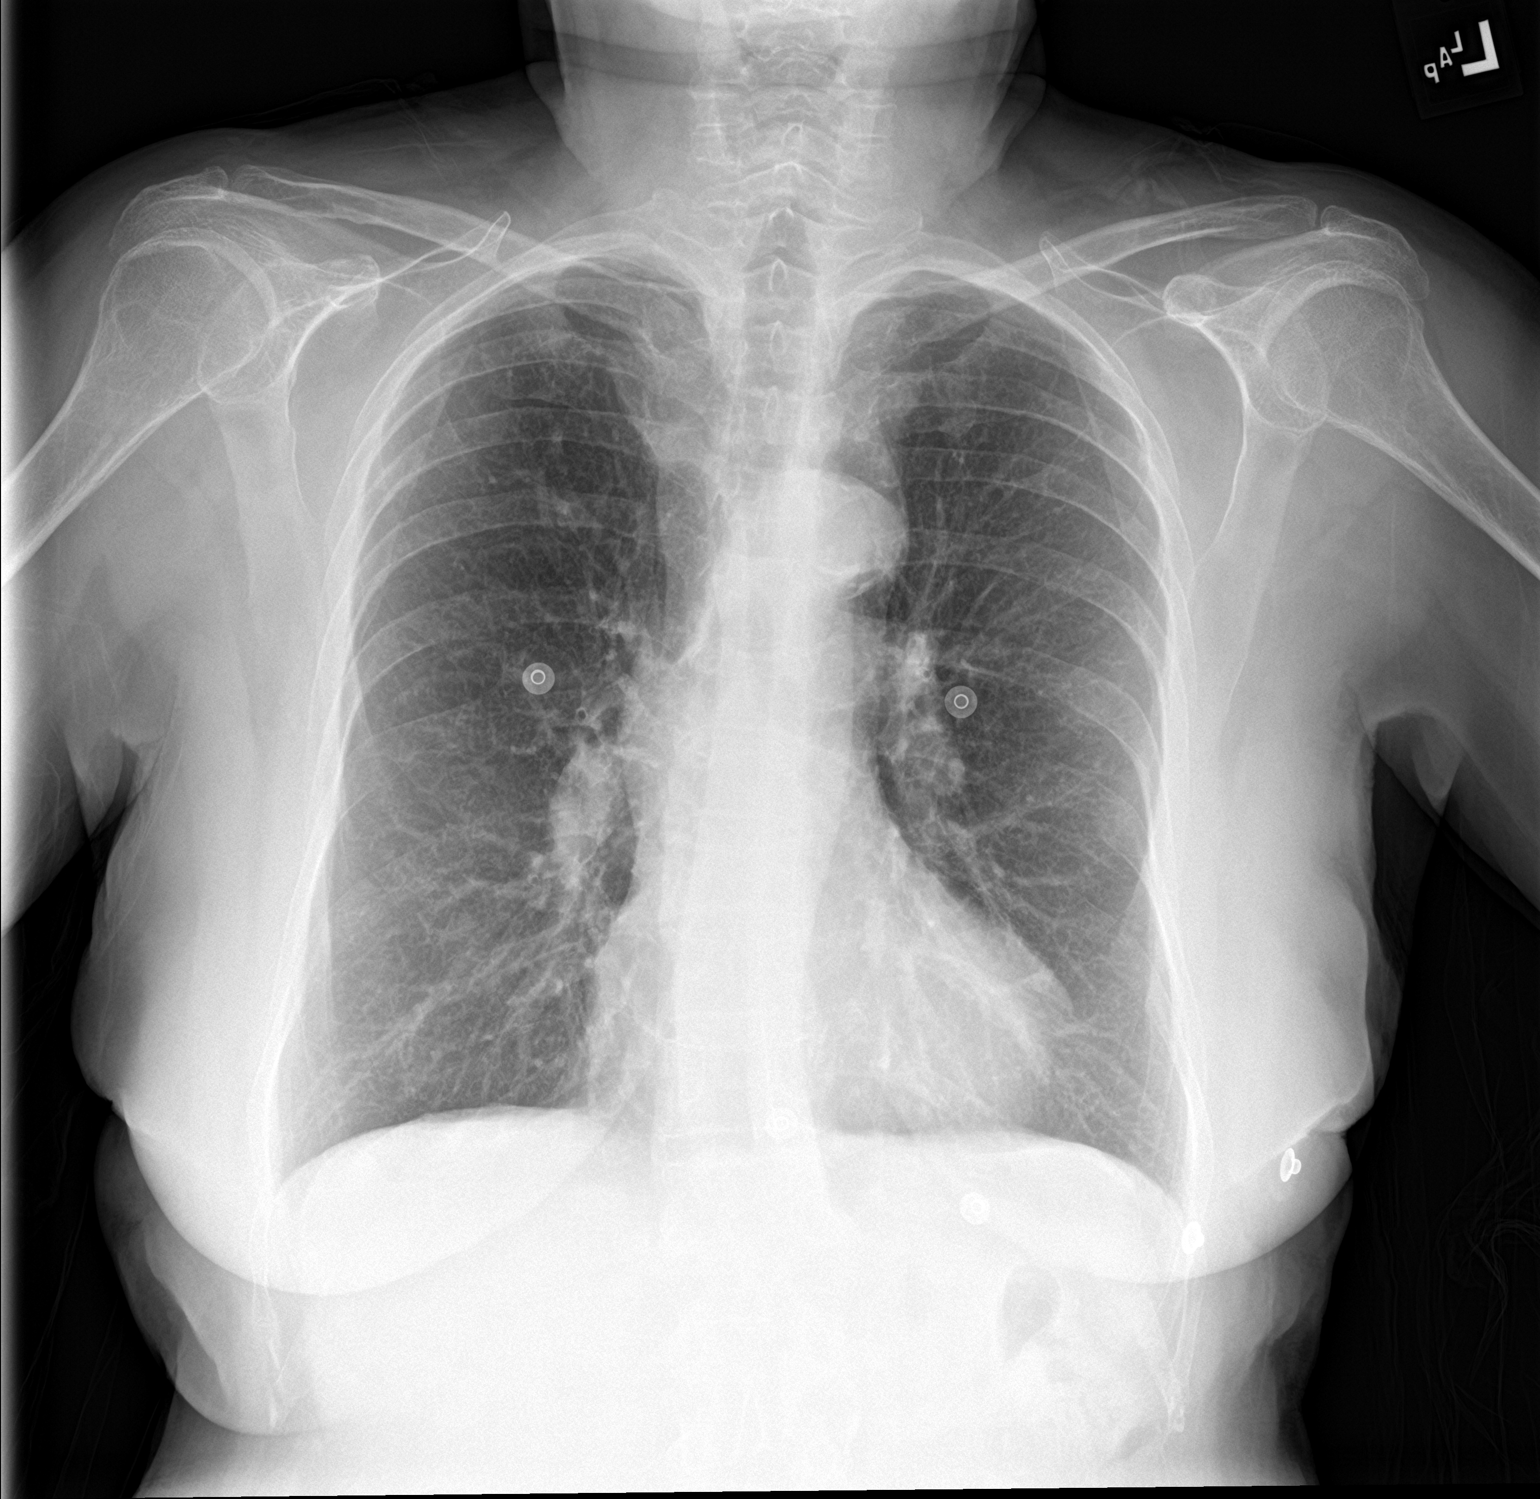

[chest lat]
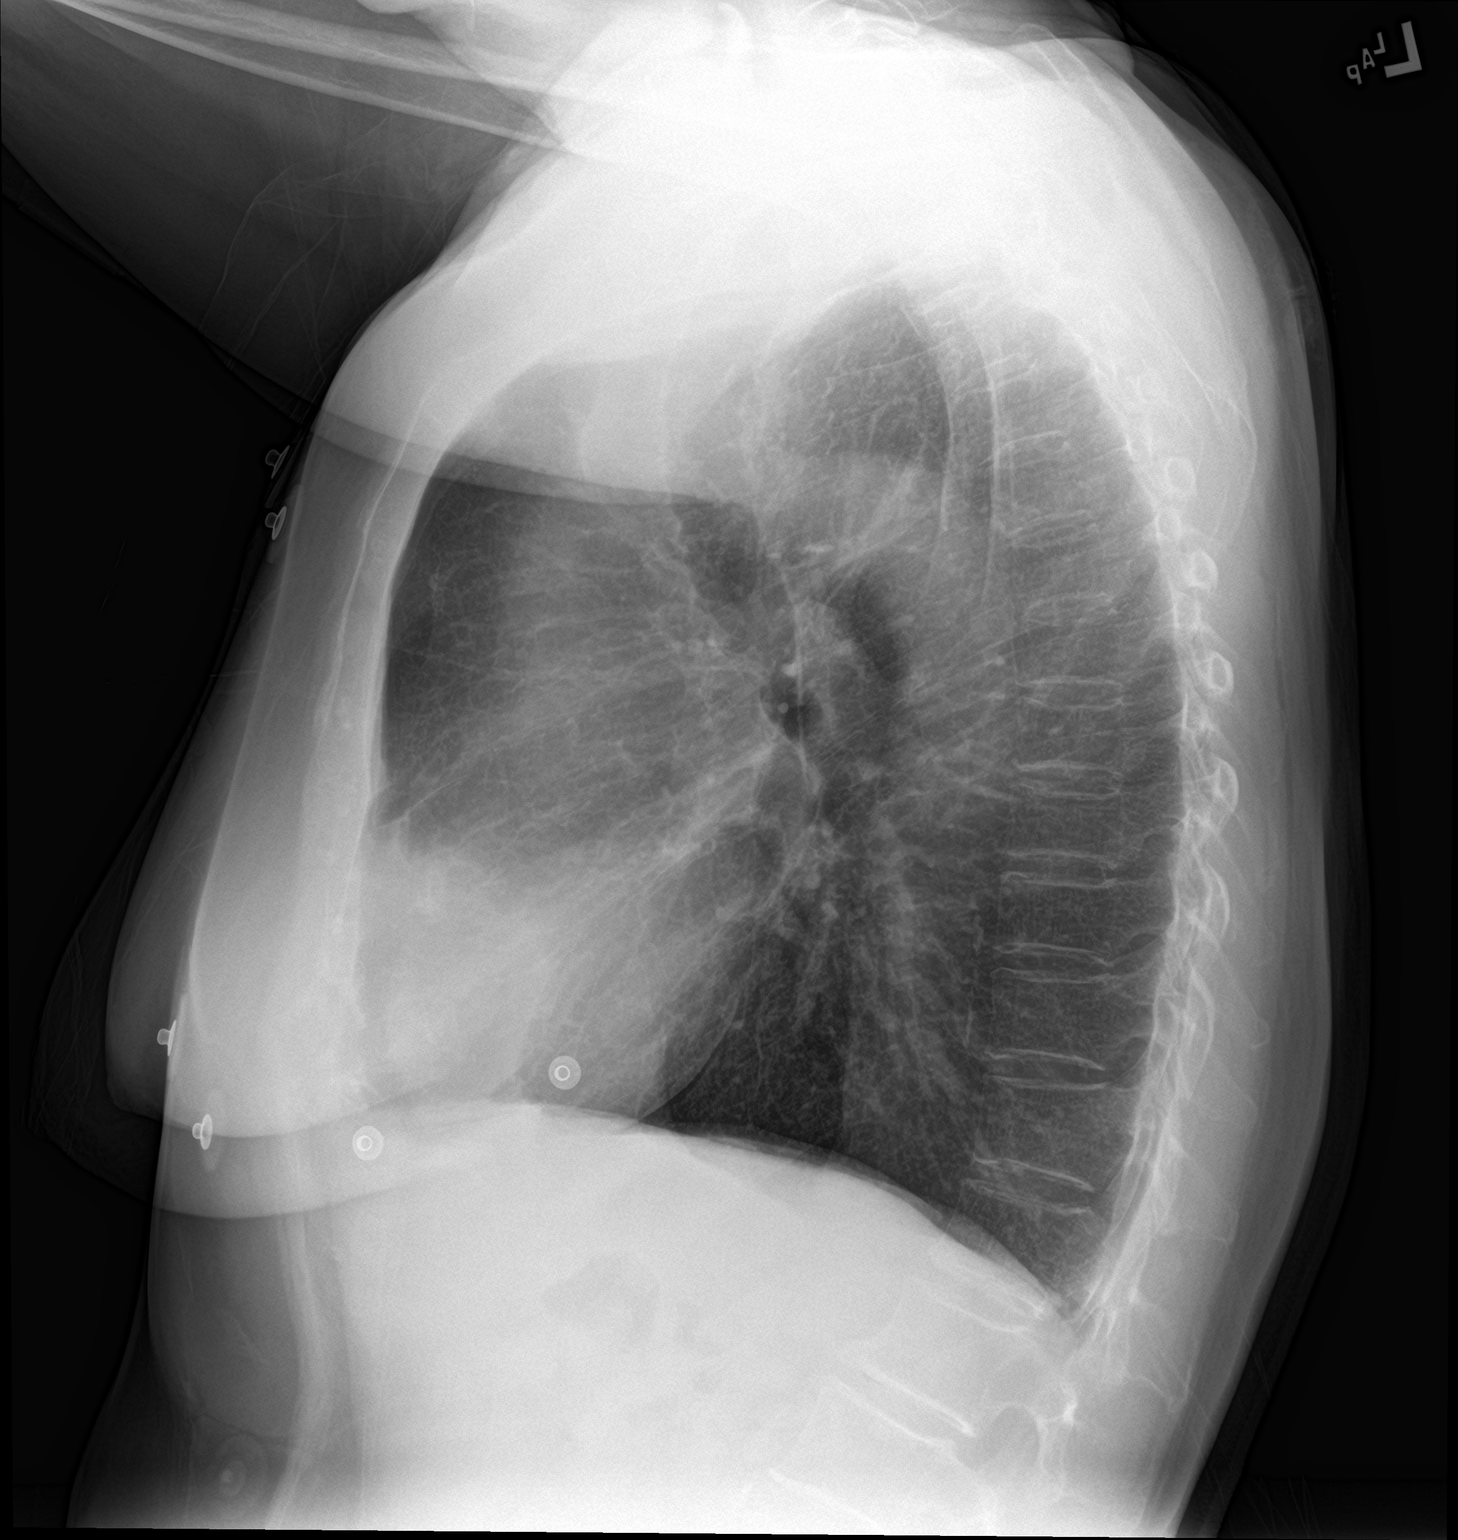

[2 of 2 positions shown; findings below may reference images not displayed]

FINDINGS: The cardiomediastinal silhouette is unchanged with mild prominence
of the central pulmonary arteries. The cardiac silhouette is normal
in size. Aortic atherosclerosis is noted. The lungs remain
hyperinflated. No airspace consolidation, edema, pleural effusion,
or pneumothorax is identified. No acute osseous abnormality is seen.
IMPRESSION: No active cardiopulmonary disease.

## 2019-03-30 ENCOUNTER — Other Ambulatory Visit: Payer: Self-pay | Admitting: Internal Medicine

## 2019-04-12 ENCOUNTER — Ambulatory Visit (INDEPENDENT_AMBULATORY_CARE_PROVIDER_SITE_OTHER): Payer: Medicare Other | Admitting: *Deleted

## 2019-04-12 DIAGNOSIS — I495 Sick sinus syndrome: Secondary | ICD-10-CM

## 2019-04-12 LAB — CUP PACEART REMOTE DEVICE CHECK
Battery Remaining Longevity: 130 mo
Battery Voltage: 3.03 V
Brady Statistic AP VP Percent: 0.09 %
Brady Statistic AP VS Percent: 85.98 %
Brady Statistic AS VP Percent: 0.01 %
Brady Statistic AS VS Percent: 13.92 %
Brady Statistic RA Percent Paced: 90.32 %
Brady Statistic RV Percent Paced: 0.11 %
Date Time Interrogation Session: 20200616043111
Implantable Lead Implant Date: 20180808
Implantable Lead Implant Date: 20180808
Implantable Lead Location: 753859
Implantable Lead Location: 753860
Implantable Lead Model: 3830
Implantable Lead Model: 5076
Implantable Pulse Generator Implant Date: 20180808
Lead Channel Impedance Value: 285 Ohm
Lead Channel Impedance Value: 342 Ohm
Lead Channel Impedance Value: 399 Ohm
Lead Channel Impedance Value: 532 Ohm
Lead Channel Pacing Threshold Amplitude: 0.75 V
Lead Channel Pacing Threshold Amplitude: 1.125 V
Lead Channel Pacing Threshold Pulse Width: 0.4 ms
Lead Channel Pacing Threshold Pulse Width: 0.4 ms
Lead Channel Sensing Intrinsic Amplitude: 2.75 mV
Lead Channel Sensing Intrinsic Amplitude: 2.75 mV
Lead Channel Sensing Intrinsic Amplitude: 7.375 mV
Lead Channel Sensing Intrinsic Amplitude: 7.375 mV
Lead Channel Setting Pacing Amplitude: 2 V
Lead Channel Setting Pacing Amplitude: 2.5 V
Lead Channel Setting Pacing Pulse Width: 0.4 ms
Lead Channel Setting Sensing Sensitivity: 0.9 mV

## 2019-04-21 ENCOUNTER — Encounter: Payer: Self-pay | Admitting: Cardiology

## 2019-04-21 NOTE — Progress Notes (Signed)
Remote pacemaker transmission.   

## 2019-04-22 ENCOUNTER — Emergency Department
Admission: EM | Admit: 2019-04-22 | Discharge: 2019-04-23 | Disposition: A | Discharge status: Home / Self Care | Payer: Medicare Other | Attending: Emergency Medicine | Admitting: Emergency Medicine

## 2019-04-22 ENCOUNTER — Other Ambulatory Visit: Payer: Self-pay

## 2019-04-22 DIAGNOSIS — I493 Ventricular premature depolarization: Secondary | ICD-10-CM | POA: Diagnosis not present

## 2019-04-22 DIAGNOSIS — F17210 Nicotine dependence, cigarettes, uncomplicated: Secondary | ICD-10-CM | POA: Diagnosis not present

## 2019-04-22 DIAGNOSIS — Z79899 Other long term (current) drug therapy: Secondary | ICD-10-CM | POA: Diagnosis not present

## 2019-04-22 DIAGNOSIS — J449 Chronic obstructive pulmonary disease, unspecified: Secondary | ICD-10-CM | POA: Diagnosis not present

## 2019-04-22 DIAGNOSIS — R531 Weakness: Secondary | ICD-10-CM

## 2019-04-22 DIAGNOSIS — I10 Essential (primary) hypertension: Secondary | ICD-10-CM | POA: Diagnosis not present

## 2019-04-22 DIAGNOSIS — Z7982 Long term (current) use of aspirin: Secondary | ICD-10-CM | POA: Diagnosis not present

## 2019-04-22 DIAGNOSIS — Z950 Presence of cardiac pacemaker: Secondary | ICD-10-CM | POA: Insufficient documentation

## 2019-04-22 LAB — BASIC METABOLIC PANEL
Anion gap: 8 (ref 5–15)
BUN: 12 mg/dL (ref 8–23)
CO2: 28 mmol/L (ref 22–32)
Calcium: 9 mg/dL (ref 8.9–10.3)
Chloride: 105 mmol/L (ref 98–111)
Creatinine, Ser: 0.62 mg/dL (ref 0.44–1.00)
GFR calc Af Amer: >60 mL/min (ref >60)
GFR calc non Af Amer: >60 mL/min (ref >60)
Glucose, Bld: 99 mg/dL (ref 70–99)
Potassium: 4 mmol/L (ref 3.5–5.1)
Sodium: 141 mmol/L (ref 135–145)

## 2019-04-22 LAB — CBC
HCT: 47.2 % — ABNORMAL HIGH (ref 36.0–46.0)
Hemoglobin: 15.7 g/dL — ABNORMAL HIGH (ref 12.0–15.0)
MCH: 32.6 pg (ref 26.0–34.0)
MCHC: 33.3 g/dL (ref 30.0–36.0)
MCV: 97.9 fL (ref 80.0–100.0)
Platelets: 189 10*3/uL (ref 150–400)
RBC: 4.82 MIL/uL (ref 3.87–5.11)
RDW: 12.6 % (ref 11.5–15.5)
WBC: 9.6 10*3/uL (ref 4.0–10.5)
nRBC: 0 % (ref 0.0–0.2)

## 2019-04-22 NOTE — ED Notes (Addendum)
Patient ambulating in lobby

## 2019-04-22 NOTE — ED Notes (Signed)
Patient ambulating in lobby with steady gait.

## 2019-04-22 NOTE — ED Provider Notes (Signed)
[facility] Emergency Department Provider Note    ____________________________________________   I have reviewed the triage vital signs and the nursing notes.   HISTORY  Chief Complaint Weakness  History limited by: Not Limited   HPI Heidi Carroll is a 78 y.o. female who presents to the emergency department today because of concern for weakness, hypertension and bradycardia. The patient states that she has felt weak and fatigued over the past 2-3 days. The patient then felt slightly flushed today which prompted her to check her blood pressure. She did not it was high and her heart rate was noted to be low on a pulse oximetry. The patient denies any measured fevers. Has not had any chest pain or shortness of breath. Denies any recent illness. Denies any recent medication changes.    Records reviewed. Per medical record review patient has a history of cardiac pacemaker.   Past Medical History:  Diagnosis Date  . A-fib ([place])   . Depression   . Hypertension   . Neuropathy    idiopathic  . Presence of permanent cardiac pacemaker     Patient Active Problem List   Diagnosis Date Noted  . Left shoulder tendonitis 11/16/2018  . Aortic atherosclerosis ([place]) 09/06/2018  . Edema 07/23/2017  . Sinus node dysfunction ([place]) 06/03/2017  . Atrial fibrillation ([place]) 03/02/2017  . Typical atrial flutter ([place]) 02/04/2017  . COPD (chronic obstructive pulmonary disease) ([place]) 11/18/2016  . Mood disorder ([place]) 11/18/2016  . Essential hypertension, benign 11/18/2016  . Nicotine dependence 12/31/2015  . Preventative health care 12/22/2014  . Advance directive discussed with patient 12/22/2014  . Idiopathic peripheral neuropathy 04/02/2007    Past Surgical History:  Procedure Laterality Date  . A-FLUTTER ABLATION N/A 02/04/2017   Procedure: A-Flutter Ablation;  Surgeon: Deboraha Sprang, MD;  Location: [place] CV LAB;  Service: Cardiovascular;  Laterality: N/A;   . BUNIONECTOMY Bilateral 1995   bilateral   . CATARACT EXTRACTION W/ INTRAOCULAR LENS  IMPLANT, BILATERAL Bilateral   . INSERT / REPLACE / REMOVE PACEMAKER  06/03/2017  . PACEMAKER IMPLANT N/A 06/03/2017   Procedure: Pacemaker Implant;  Surgeon: Deboraha Sprang, MD;  Location: [place] CV LAB;  Service: Cardiovascular;  Laterality: N/A;  . THROAT SURGERY  1980s   "nodules in my throat"  . VAGINAL DELIVERY  1963   X 1  . VAGINAL HYSTERECTOMY  1974    Prior to Admission medications   Medication Sig Start Date End Date Taking? Authorizing Provider  acetaminophen (TYLENOL) 500 MG tablet Take 500 mg by mouth daily as needed for moderate pain or headache.    [provider]  ALPRAZolam Duanne Moron) 0.25 MG tablet 1/2 to 1 by mouth twice a day as needed 09/30/18   Viviana Simpler I, MD  aspirin EC 81 MG tablet Take 81 mg by mouth every evening.     [provider]  Calcium Carbonate-Vitamin D (CALCIUM 600+D PO) Take 1 tablet by mouth every evening.     [provider]  diltiazem (CARDIZEM CD) 120 MG 24 hr capsule Take 1 capsule (120 mg total) by mouth daily. 11/24/18   Deboraha Sprang, MD  flecainide (TAMBOCOR) 50 MG tablet Take 1 tablet (50 mg total) by mouth 2 (two) times daily. 10/12/18   Deboraha Sprang, MD  gabapentin (NEURONTIN) 600 MG tablet TAKE 1 TABLET BY MOUTH AT BEDTIME 02/07/19   Viviana Simpler I, MD  hydrALAZINE (APRESOLINE) 25 MG tablet Take one tablet (25 mg)  by mouth every 6 hours as needed for a blood pressure >170 on the top number 10/12/18   Duke SalviaKlein, Steven C, MD  Lido-Capsaicin-Men-Methyl Sal (MEDI-PATCH-LIDOCAINE EX) Apply topically.    [provider]  losartan (COZAAR) 50 MG tablet Take 1 tablet by mouth once daily 03/30/19   Duke SalviaKlein, Steven C, MD  mirtazapine (REMERON) 15 MG tablet Take 1 tablet (15 mg total) by mouth at bedtime. 10/01/18   Karie SchwalbeLetvak, Richard I, MD    Allergies Cymbalta [duloxetine hcl], Wellbutrin [bupropion], Zoloft [sertraline  hcl], Nicotine, and Penicillins  Family History  Problem Relation Age of Onset  . Cancer Father        colon  . Heart attack Brother   . Diabetes Maternal Grandfather     Social History Social History   Tobacco Use  . Smoking status: Current Some Day Smoker    Packs/day: 1.00    Years: 60.00    Pack years: 60.00    Types: Cigarettes  . Smokeless tobacco: Never Used  Substance Use Topics  . Alcohol use: No  . Drug use: No    Review of Systems Constitutional: No fever/chills. Positive for fatigue.  Eyes: No visual changes. ENT: No sore throat. Cardiovascular: Positive for low heart rate.  Respiratory: Denies shortness of breath. Gastrointestinal: No abdominal pain.  No nausea, no vomiting.  No diarrhea.   Genitourinary: Negative for dysuria. Musculoskeletal: Negative for back pain. Skin: Negative for rash. Neurological: Negative for headaches, focal weakness or numbness.  ____________________________________________   PHYSICAL EXAM:  VITAL SIGNS: ED Triage Vitals  Enc Vitals Group     BP 04/22/19 1915 (!) 167/138     Pulse Rate 04/22/19 1915 86     Resp 04/22/19 1915 18     Temp 04/22/19 1915 98.3 F (36.8 C)     Temp Source 04/22/19 1915 Oral     SpO2 04/22/19 1915 94 %     Weight 04/22/19 1916 143 lb (64.9 kg)     Height 04/22/19 1916 5' 2.5" (1.588 m)     Head Circumference --      Peak Flow --      Pain Score 04/22/19 1916 0   Constitutional: Alert and oriented.  Eyes: Conjunctivae are normal.  ENT      Head: Normocephalic and atraumatic.      Nose: No congestion/rhinnorhea.      Mouth/Throat: Mucous membranes are moist.      Neck: No stridor. Hematological/Lymphatic/Immunilogical: No cervical lymphadenopathy. Cardiovascular: Normal rate, irrregular rhythm.  Frequent PVCs. Pulse not always palpated with PVCs. No murmurs, rubs, or gallops.  Respiratory: Normal respiratory effort without tachypnea nor retractions. Breath sounds are clear and equal  bilaterally. No wheezes/rales/rhonchi. Gastrointestinal: Soft and non tender. No rebound. No guarding.  Genitourinary: Deferred Musculoskeletal: Normal range of motion in all extremities. No lower extremity edema. Neurologic:  Normal speech and language. No gross focal neurologic deficits are appreciated.  Skin:  Skin is warm, dry and intact. No rash noted. Psychiatric: Mood and affect are normal. Speech and behavior are normal. Patient exhibits appropriate insight and judgment.  ____________________________________________    LABS (pertinent positives/negatives)  BMP wnl CBC wbc 9.6, hgb 15.7, plt 189 UA hazy, small hgb dipstick, 6-10 RBC, 0-5 WBC, rare bacteria ____________________________________________   EKG  I, Phineas SemenGraydon Captola Teschner, attending physician, personally viewed and interpreted this EKG  EKG Time: 1918 Rate: 77 Rhythm: atrial paced rhythm with prolonged AV conduction with PVCs Axis: normal Intervals: qtc 454 QRS: narrow, q waves  v1 ST changes: no st elevation Impression: abnormal ekg  ____________________________________________    RADIOLOGY  None  ____________________________________________   PROCEDURES  Procedures  ____________________________________________   INITIAL IMPRESSION / ASSESSMENT AND PLAN / ED COURSE  Pertinent labs & imaging results that were available during my care of the patient were reviewed by me and considered in my medical decision making (see chart for details).   Patient presented to the emergency department today because of concern for weakness, high blood pressure and slow heart rate. Patient was minimally hypertensive here in the emergency department. EKG and cardiac monitor did show frequent PVCs. Blood work without concerning anemia or electrolyte abnormality. UA did have some rare bacteria. Will send for culture. Do wonder if the PVCs are causing some of the weakness. Discussed the findings with the patient. Discussed  importance of follow up.  ____________________________________________   FINAL CLINICAL IMPRESSION(S) / ED DIAGNOSES  Final diagnoses:  Weakness  Hypertension, unspecified type  PVC (premature ventricular contraction)     Note: This dictation was prepared with Dragon dictation. Any transcriptional errors that result from this process are unintentional     Phineas SemenGoodman, Shonteria Abeln, MD 04/23/19 0131

## 2019-04-22 NOTE — ED Notes (Signed)
Patient states that when at home she checked her pulse on pulsometer and was reading in the 40's so patient checked BP and was high and decided to come to ER to be checked out. Per patient she has a pacemaker.

## 2019-04-22 NOTE — ED Triage Notes (Signed)
Pt reports over the last few days feeling fatigued. Today she checked her blood pressure and it was elevated and her heart rate was low according to her portable pulse ox. Pt denies n/v/d, shortness of breath or chest pain. Pt has hx of afib and has a pacemaker.

## 2019-04-22 NOTE — ED Notes (Signed)
Patient up to stat desk asking about wait times. RN discussed that exact wait times cannot be given, and further discussed triage/wait process. Patient verbalized understanding.

## 2019-04-23 DIAGNOSIS — I493 Ventricular premature depolarization: Secondary | ICD-10-CM | POA: Diagnosis not present

## 2019-04-23 LAB — URINALYSIS, COMPLETE (UACMP) WITH MICROSCOPIC
Bilirubin Urine: NEGATIVE
Glucose, UA: NEGATIVE mg/dL
Ketones, ur: NEGATIVE mg/dL
Leukocytes,Ua: NEGATIVE
Nitrite: NEGATIVE
Protein, ur: NEGATIVE mg/dL
Specific Gravity, Urine: 1.015 (ref 1.005–1.030)
pH: 5 (ref 5.0–8.0)

## 2019-04-23 NOTE — Discharge Instructions (Addendum)
Please seek medical attention for any high fevers, chest pain, shortness of breath, change in behavior, persistent vomiting, bloody stool or any other new or concerning symptoms.  

## 2019-04-24 LAB — URINE CULTURE

## 2019-04-25 ENCOUNTER — Telehealth: Payer: Self-pay | Admitting: Internal Medicine

## 2019-04-25 NOTE — Telephone Encounter (Signed)
Patient was in ED 6/27 and was told to follow up with Dr. Caryl Comes. Patient has been scheduled on 8/20 but would like sooner if possible

## 2019-04-25 NOTE — Telephone Encounter (Signed)
Staff Message sent to Lorenda Hatchet, Alvis Lemmings and Huey Bienenstock to see about earlier appointment.

## 2019-04-26 ENCOUNTER — Other Ambulatory Visit: Payer: Self-pay

## 2019-04-26 ENCOUNTER — Ambulatory Visit (INDEPENDENT_AMBULATORY_CARE_PROVIDER_SITE_OTHER): Payer: Medicare Other | Admitting: Internal Medicine

## 2019-04-26 ENCOUNTER — Encounter: Payer: Self-pay | Admitting: Internal Medicine

## 2019-04-26 DIAGNOSIS — I10 Essential (primary) hypertension: Secondary | ICD-10-CM | POA: Diagnosis not present

## 2019-04-26 DIAGNOSIS — J449 Chronic obstructive pulmonary disease, unspecified: Secondary | ICD-10-CM

## 2019-04-26 DIAGNOSIS — F39 Unspecified mood [affective] disorder: Secondary | ICD-10-CM | POA: Diagnosis not present

## 2019-04-26 MED ORDER — ALBUTEROL SULFATE HFA 108 (90 BASE) MCG/ACT IN AERS: 2.0000 | INHALATION_SPRAY | Freq: Four times a day (QID) | RESPIRATORY_TRACT | 1 refills | Status: DC | PRN

## 2019-04-26 NOTE — Assessment & Plan Note (Signed)
Ongoing anxiety and some dysthymia Discussed coping strategies for COVID isolation

## 2019-04-26 NOTE — Progress Notes (Signed)
Subjective:    Patient ID: Heidi Carroll, female    DOB: Mar 30, 1941, 78 y.o.   MRN: [id number]  HPI Visit for ER follow up  Interactive audio and video telecommunications were attempted between this provider and patient, however failed, due to patient having technical difficulties OR patient did not have access to video capability.  We continued and completed visit with audio only.   Virtual Visit via Telephone Note  I connected with Heidi Carroll on 04/26/19 at 10:15 AM EDT by telephone and verified that I am speaking with the correct person using two identifiers.  Location: Patient: home Provider: office   I discussed the limitations, risks, security and privacy concerns of performing an evaluation and management service by telephone and the availability of in person appointments. I also discussed with the patient that there may be a patient responsible charge related to this service. The patient expressed understanding and agreed to proceed.   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 acetaminophen (TYLENOL) 500 MG tablet Take 500 mg by mouth daily as needed for moderate pain or headache.    . ALPRAZolam (XANAX) 0.25 MG tablet 1/2 to 1 by mouth twice a day as needed 60 tablet 0  . aspirin EC 81 MG tablet Take 81 mg by mouth every  evening.     . Calcium Carbonate-Vitamin D (CALCIUM 600+D PO) Take 1 tablet by mouth every evening.     . diltiazem (CARDIZEM CD) 120 MG 24 hr capsule Take 1 capsule (120 mg total) by mouth daily. 90 capsule 3  . flecainide (TAMBOCOR) 50 MG tablet Take 1 tablet (50 mg total) by mouth 2 (two) times daily. 180 tablet 2  . gabapentin (NEURONTIN) 600 MG tablet TAKE 1 TABLET BY MOUTH AT BEDTIME 90 tablet 2  . hydrALAZINE (APRESOLINE) 25 MG tablet Take one tablet (25 mg) by mouth every 6 hours as needed for a blood pressure >170 on the top number 90 tablet 0  . Lido-Capsaicin-Men-Methyl Sal (MEDI-PATCH-LIDOCAINE EX) Apply topically.    Marland Kitchen. losartan (COZAAR) 50 MG tablet Take 1 tablet by mouth once daily 90 tablet 3  . mirtazapine (REMERON) 15 MG tablet Take 1 tablet (15 mg total) by mouth at bedtime. 90 tablet 3   No current facility-administered medications on file prior to visit.     Allergies  Allergen Reactions  . Cymbalta [Duloxetine Hcl] Nausea Only  . Wellbutrin [Bupropion] Other (See Comments)    Elevated blood pressure, anxiety  . Zoloft [Sertraline Hcl] Nausea Only    Severe weight loss   . Nicotine Palpitations    Nicotine Patches   . Penicillins Rash    Has patient had a PCN reaction causing immediate rash, facial/tongue/throat swelling, SOB or lightheadedness with hypotension yes Has patient had a PCN reaction causing severe rash involving mucus membranes  or skin necrosis: no Has patient had a PCN reaction that required hospitalization no Has patient had a PCN reaction occurring within the last 10 years: no If all of the above answers are "NO", then may proceed with Cephalosporin use.    Past Medical History:  Diagnosis Date  . A-fib ([place])   . Depression   . Hypertension   . Neuropathy    idiopathic  . Presence of permanent cardiac pacemaker     Past Surgical History:  Procedure Laterality Date  . A-FLUTTER ABLATION N/A 02/04/2017   Procedure: A-Flutter Ablation;   Surgeon: Deboraha Sprang, MD;  Location: [facility];  Service: Cardiovascular;  Laterality: N/A;  . BUNIONECTOMY Bilateral 1995   bilateral   . CATARACT EXTRACTION W/ INTRAOCULAR LENS  IMPLANT, BILATERAL Bilateral   . INSERT / REPLACE / REMOVE PACEMAKER  06/03/2017  . PACEMAKER IMPLANT N/A 06/03/2017   Procedure: Pacemaker Implant;  Surgeon: Deboraha Sprang, MD;  Location: [facility];  Service: Cardiovascular;  Laterality: N/A;  . THROAT SURGERY  1980s   "nodules in my throat"  . VAGINAL DELIVERY  1963   X 1  . VAGINAL HYSTERECTOMY  1974    Family History  Problem Relation Age of Onset  . Cancer Father        colon  . Heart attack Brother   . Diabetes Maternal Grandfather     Social History   Socioeconomic History  . Marital status: Widowed    Spouse name: Not on file  . Number of children: 1  . Years of education: Not on file  . Highest education level: Not on file  Occupational History  . Occupation: Retired, [employer] Engineer, building services)  Social Needs  . Financial resource strain: Not on file  . Food insecurity    Worry: Not on file    Inability: Not on file  . Transportation needs    Medical: Not on file    Non-medical: Not on file  Tobacco Use  . Smoking status: Current Some Day Smoker    Packs/day: 1.00    Years: 60.00    Pack years: 60.00    Types: Cigarettes  . Smokeless tobacco: Never Used  Substance and Sexual Activity  . Alcohol use: No  . Drug use: No  . Sexual activity: Never  Lifestyle  . Physical activity    Days per week: Not on file    Minutes per session: Not on file  . Stress: Not on file  Relationships  . Social Herbalist on phone: Not on file    Gets together: Not on file    Attends religious service: Not on file    Active member of club or organization: Not on file    Attends meetings of clubs or organizations: Not on file    Relationship status: Not on file  . Intimate partner violence    Fear of current or ex  partner: Not on file    Emotionally abused: Not on file    Physically abused: Not on file    Forced sexual activity: Not on file  Other Topics Concern  . Not on file  Social History Narrative   Husband died 08-25-23      Has living will   Has health care POA--- Essie Hart, friend   Requests DNR--- form done 12/22/14   No tube feedings if cognitively unaware    ROS Appetite spotty. Doesn't think she has lost weight Didn't  sleep well last night---often doesn't feel rested in the morning anyway Has some SOB---worse with the humidity. Wonders about trying inhaler Wonders about trying CBD oil to stop smoking. Okay to try  Observations/Objective: Normal conversation No SOB with talking No overt anxiety or depression  Assessment and Plan: See problem list  Follow Up Instructions:    I discussed the assessment and treatment plan with the patient. The patient was provided an opportunity to ask questions and all were answered. The patient agreed with the plan and demonstrated an understanding of the instructions.   The patient was advised to call back or seek an in-person evaluation if the symptoms worsen or if the condition fails to improve as anticipated.  I provided 17 minutes of non-face-to-face time during this encounter.   Tillman Abideichard Parish Augustine, MD    Review of Systems     Objective:   Physical Exam         Assessment & Plan:

## 2019-04-26 NOTE — Assessment & Plan Note (Signed)
Will try albuterol inhaler This might be related to her spells of weakness/flushing

## 2019-04-26 NOTE — Assessment & Plan Note (Signed)
Continues to have labile BP---that mostly seems related to mood and stress She will see Dr Caryl Comes in follow up I don't think any changes are needed now

## 2019-04-28 NOTE — Telephone Encounter (Signed)
Patient has been scheduled for 05/12/19 which is sooner. Closing encounter.

## 2019-05-12 ENCOUNTER — Ambulatory Visit: Payer: Medicare Other | Admitting: Internal Medicine

## 2019-06-03 IMAGING — CR DG CHEST 2V
1 series · 2 of 2 positions shown · non-contrast
Comparison: Chest x-ray 03/11/2017.

CLINICAL DATA: 75-year-old female with history weakness and fatigue
for the past 2 days. Low heart rate.

EXAM:
CHEST  2 VIEW

[Series 1: dg chest 2 view · 0.14mm/px · 2 of 2 slices shown]
[im 1/2]
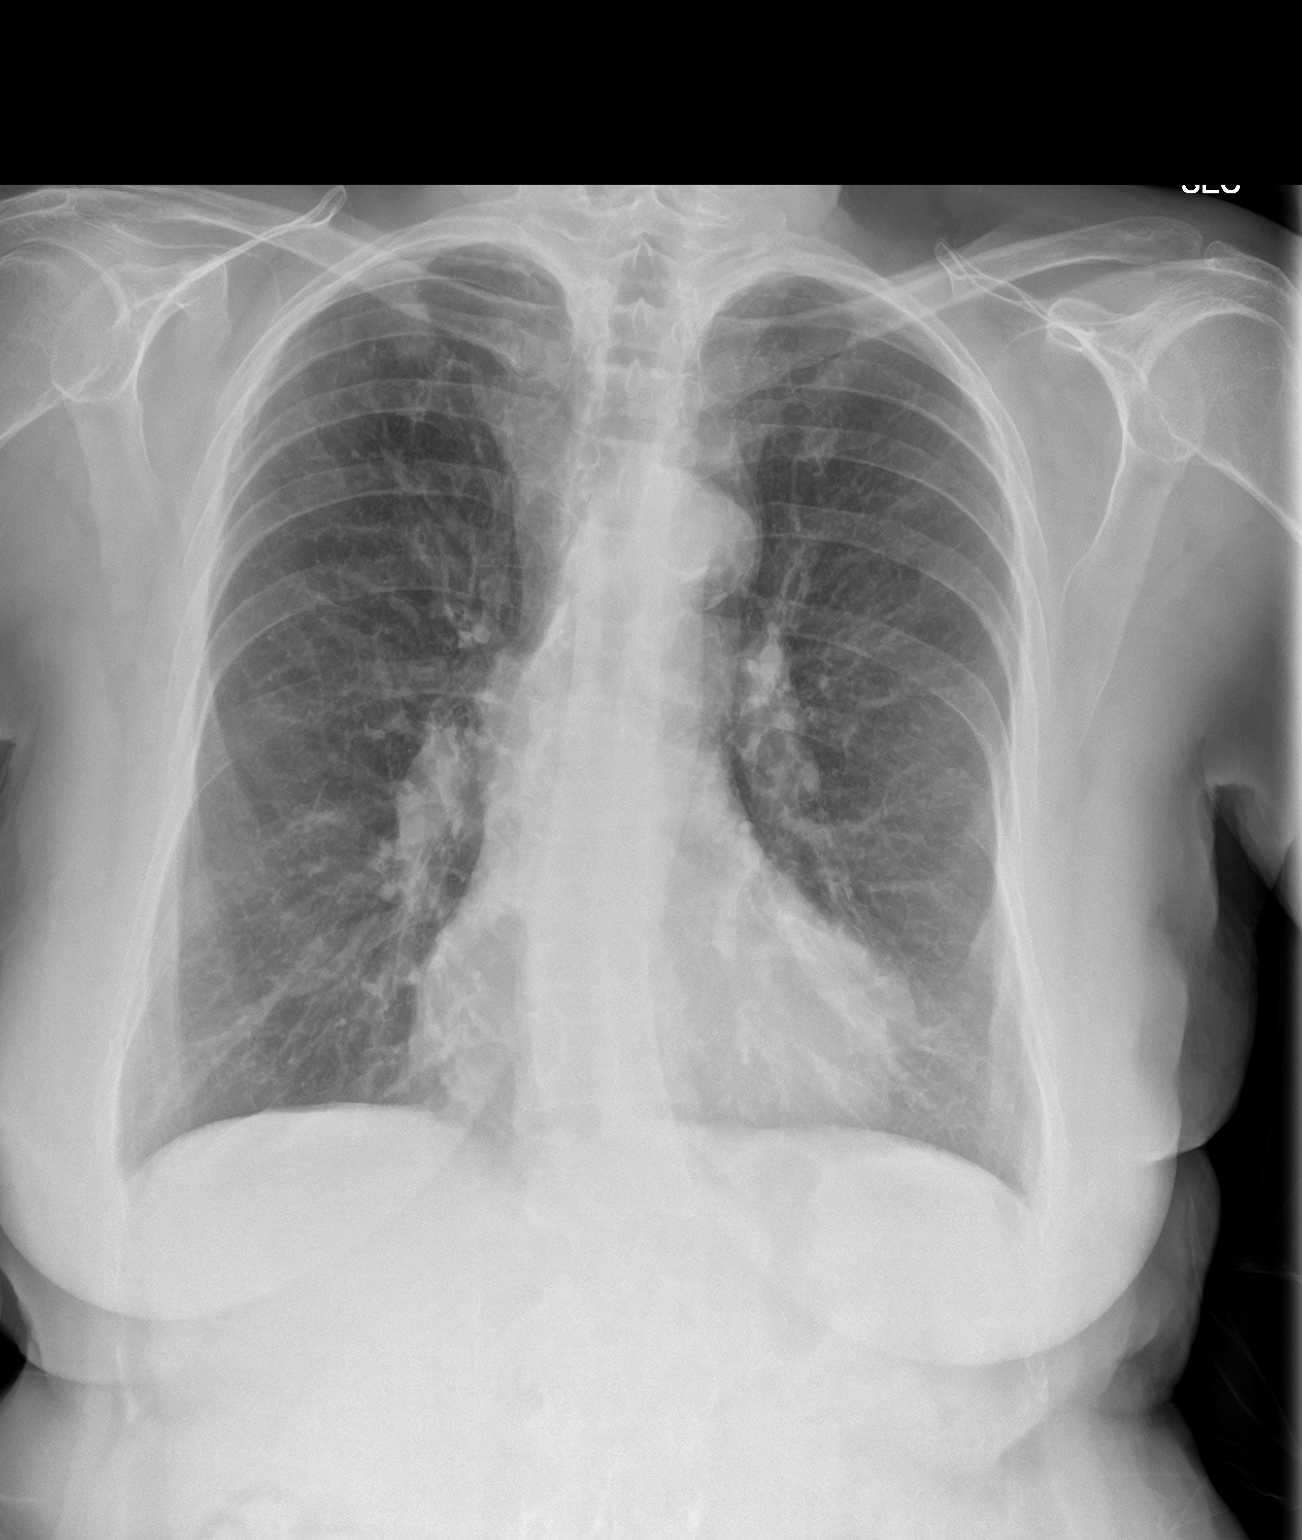
[im 2/2]
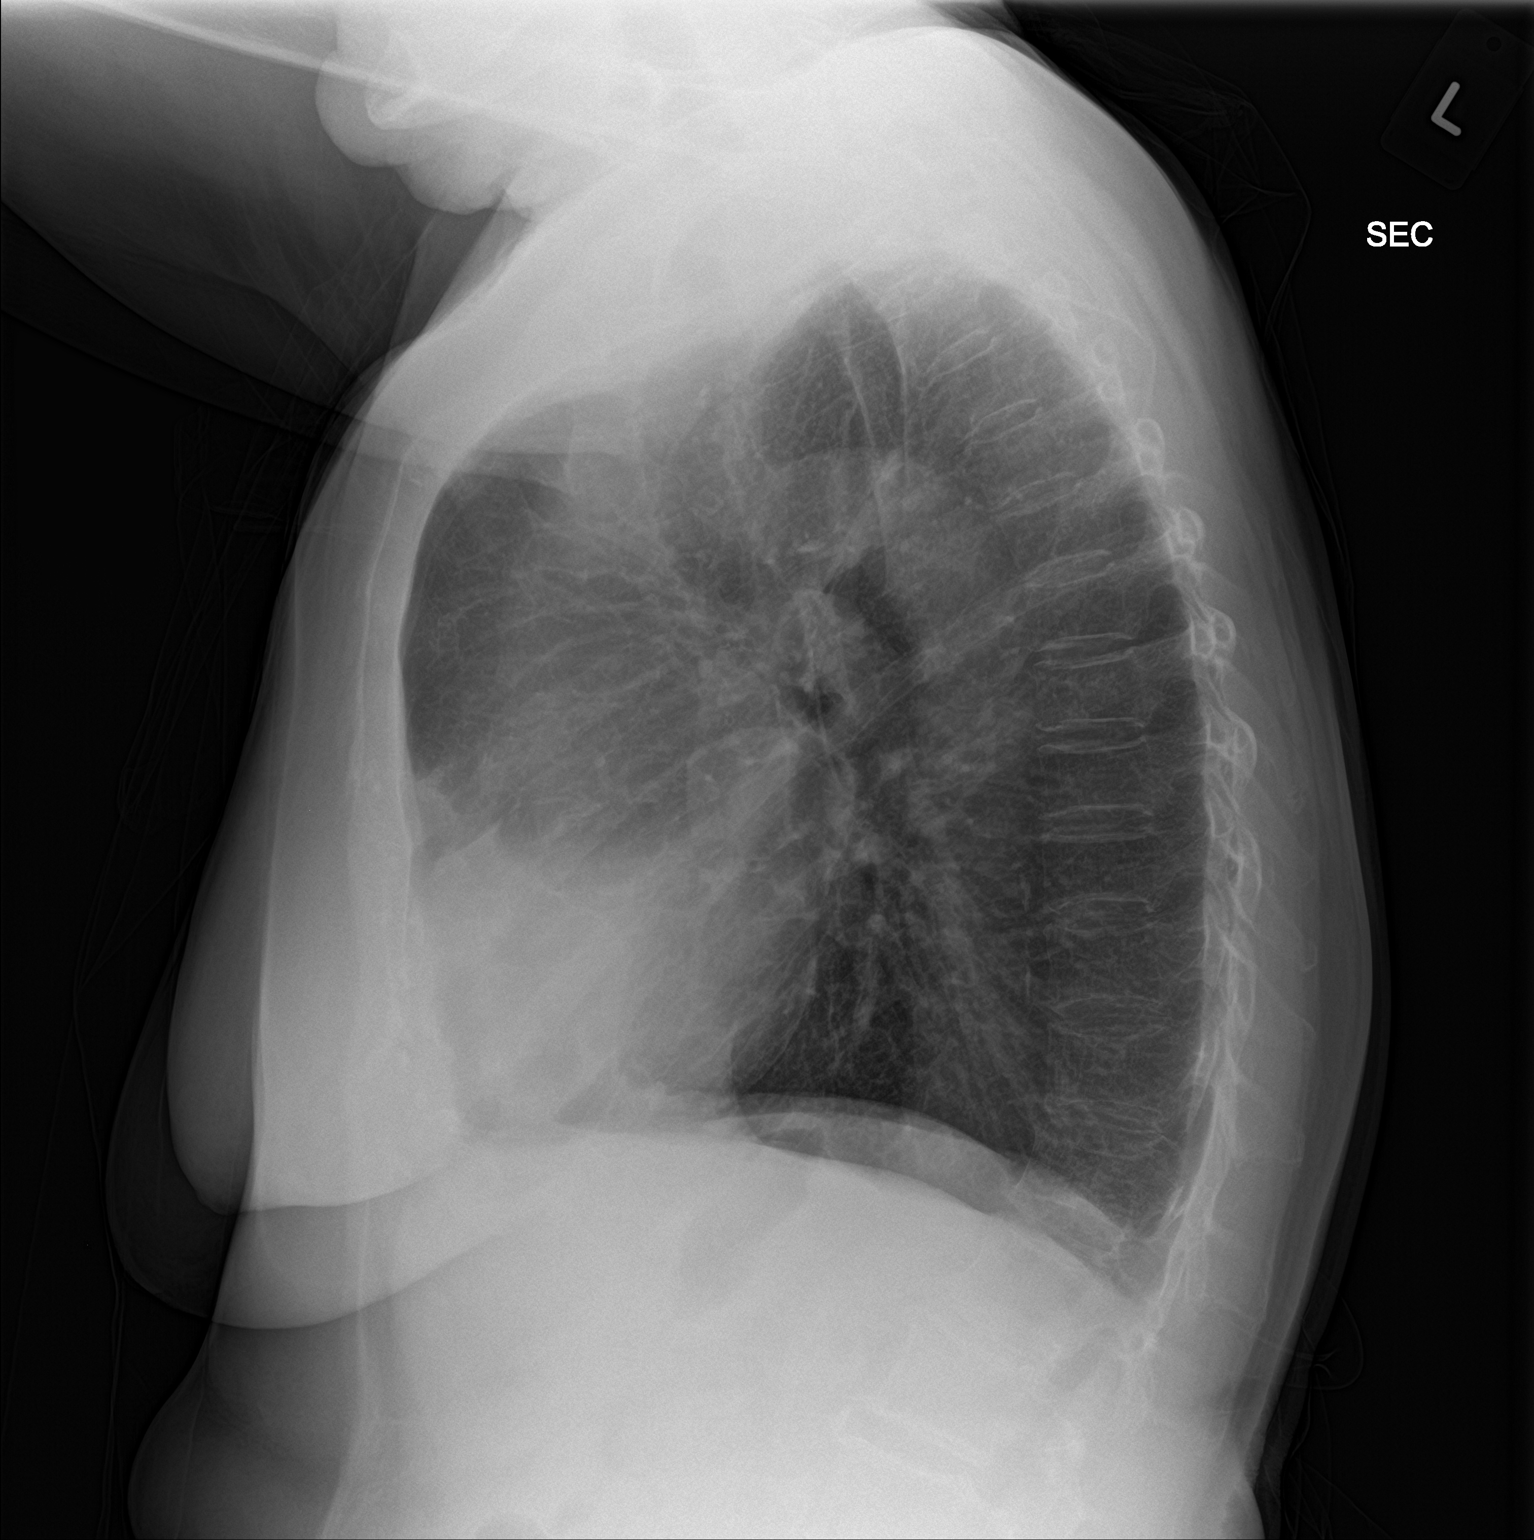

[2 of 2 positions shown; findings below may reference images not displayed]

FINDINGS: Lung volumes are normal. No consolidative airspace disease. No
pleural effusions. No pneumothorax. No pulmonary nodule or mass
noted. Pulmonary vasculature and the cardiomediastinal silhouette
are within normal limits. Atherosclerosis in the thoracic aorta.
IMPRESSION: 1.  No radiographic evidence of acute cardiopulmonary disease.
2. Aortic atherosclerosis.
Aortic Atherosclerosis ([id number]-B09.9).

## 2019-06-16 ENCOUNTER — Ambulatory Visit: Payer: Medicare Other | Admitting: Internal Medicine

## 2019-07-12 ENCOUNTER — Ambulatory Visit (INDEPENDENT_AMBULATORY_CARE_PROVIDER_SITE_OTHER): Payer: Medicare Other | Admitting: *Deleted

## 2019-07-12 DIAGNOSIS — I495 Sick sinus syndrome: Secondary | ICD-10-CM | POA: Diagnosis not present

## 2019-07-12 DIAGNOSIS — I480 Paroxysmal atrial fibrillation: Secondary | ICD-10-CM

## 2019-07-12 LAB — CUP PACEART REMOTE DEVICE CHECK
Battery Remaining Longevity: 129 mo
Battery Voltage: 3.03 V
Brady Statistic AP VP Percent: 0.19 %
Brady Statistic AP VS Percent: 73.86 %
Brady Statistic AS VP Percent: 0.02 %
Brady Statistic AS VS Percent: 25.94 %
Brady Statistic RA Percent Paced: 78.84 %
Brady Statistic RV Percent Paced: 0.23 %
Date Time Interrogation Session: 20200915043029
Implantable Lead Implant Date: 20180808
Implantable Lead Implant Date: 20180808
Implantable Lead Location: 753859
Implantable Lead Location: 753860
Implantable Lead Model: 3830
Implantable Lead Model: 5076
Implantable Pulse Generator Implant Date: 20180808
Lead Channel Impedance Value: 285 Ohm
Lead Channel Impedance Value: 361 Ohm
Lead Channel Impedance Value: 418 Ohm
Lead Channel Impedance Value: 551 Ohm
Lead Channel Pacing Threshold Amplitude: 0.875 V
Lead Channel Pacing Threshold Amplitude: 1 V
Lead Channel Pacing Threshold Pulse Width: 0.4 ms
Lead Channel Pacing Threshold Pulse Width: 0.4 ms
Lead Channel Sensing Intrinsic Amplitude: 2 mV
Lead Channel Sensing Intrinsic Amplitude: 2 mV
Lead Channel Sensing Intrinsic Amplitude: 8.25 mV
Lead Channel Sensing Intrinsic Amplitude: 8.25 mV
Lead Channel Setting Pacing Amplitude: 2 V
Lead Channel Setting Pacing Amplitude: 2.5 V
Lead Channel Setting Pacing Pulse Width: 0.4 ms
Lead Channel Setting Sensing Sensitivity: 0.9 mV

## 2019-07-19 NOTE — Progress Notes (Signed)
Remote pacemaker transmission.   

## 2019-07-26 ENCOUNTER — Encounter: Payer: Self-pay | Admitting: Internal Medicine

## 2019-07-26 ENCOUNTER — Ambulatory Visit (INDEPENDENT_AMBULATORY_CARE_PROVIDER_SITE_OTHER): Payer: Medicare Other | Admitting: Internal Medicine

## 2019-07-26 DIAGNOSIS — R110 Nausea: Secondary | ICD-10-CM

## 2019-07-26 DIAGNOSIS — F39 Unspecified mood [affective] disorder: Secondary | ICD-10-CM | POA: Diagnosis not present

## 2019-07-26 NOTE — Assessment & Plan Note (Signed)
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

## 2019-07-26 NOTE — Progress Notes (Signed)
Subjective:    Patient ID: Heidi Carroll, female    DOB: [birth year], 78 y.o.   MRN: [id number]  HPI Virtual visit due to diarrhea Identification done Reviewed billing and she gave consent She is at home and I am in my office  Not sleeping well---may sleep for 2-3 hours, then up and can't get back to sleep Stopped the mirtazapine about a week ago--no apparent change Melatonin no help If goes to sleep later--like midnight--may sleep longer Doesn't feel her anxiety is acting up  Also with nausea for a couple of weeks Now unable to eat for 2 days---hungry but can't eat No fever No change in breathing BP seems to be okay  No vomiting Several loose or watery stools daily for past 3 days No blood No abdominal pain Able to drink fluids Tried ensure--but "too sweet" Can tolerate salty--like a peanut butter nabs  Neighbor got dramamine --didn't try it yet  Current Outpatient Medications on File Prior to Visit  Medication Sig Dispense Refill  . acetaminophen (TYLENOL) 500 MG tablet Take 500 mg by mouth daily as needed for moderate pain or headache.    . albuterol (VENTOLIN HFA) 108 (90 Base) MCG/ACT inhaler Inhale 2 puffs into the lungs every 6 (six) hours as needed for wheezing or shortness of breath. 18 g 1  . ALPRAZolam (XANAX) 0.25 MG tablet 1/2 to 1 by mouth twice a day as needed 60 tablet 0  . aspirin EC 81 MG tablet Take 81 mg by mouth every evening.     . Calcium Carbonate-Vitamin D (CALCIUM 600+D PO) Take 1 tablet by mouth every evening.     . diltiazem (CARDIZEM CD) 120 MG 24 hr capsule Take 1 capsule (120 mg total) by mouth daily. 90 capsule 3  . flecainide (TAMBOCOR) 50 MG tablet Take 1 tablet (50 mg total) by mouth 2 (two) times daily. 180 tablet 2  . gabapentin (NEURONTIN) 600 MG tablet TAKE 1 TABLET BY MOUTH AT BEDTIME 90 tablet 2  . hydrALAZINE (APRESOLINE) 25 MG tablet Take one tablet (25 mg) by mouth every 6 hours as needed for a blood pressure >170 on the top  number 90 tablet 0  . losartan (COZAAR) 50 MG tablet Take 1 tablet by mouth once daily 90 tablet 3   No current facility-administered medications on file prior to visit.     Allergies  Allergen Reactions  . Cymbalta [Duloxetine Hcl] Nausea Only  . Wellbutrin [Bupropion] Other (See Comments)    Elevated blood pressure, anxiety  . Zoloft [Sertraline Hcl] Nausea Only    Severe weight loss   . Nicotine Palpitations    Nicotine Patches   . Penicillins Rash    Has patient had a PCN reaction causing immediate rash, facial/tongue/throat swelling, SOB or lightheadedness with hypotension yes Has patient had a PCN reaction causing severe rash involving mucus membranes or skin necrosis: no Has patient had a PCN reaction that required hospitalization no Has patient had a PCN reaction occurring within the last 10 years: no If all of the above answers are "NO", then may proceed with Cephalosporin use.    Past Medical History:  Diagnosis Date  . A-fib ([place])   . Depression   . Hypertension   . Neuropathy    idiopathic  . Presence of permanent cardiac pacemaker     Past Surgical History:  Procedure Laterality Date  . A-FLUTTER ABLATION N/A 02/04/2017   Procedure: A-Flutter Ablation;  Surgeon: Deboraha Sprang, MD;  Location:  [facility];  Service: Cardiovascular;  Laterality: N/A;  . BUNIONECTOMY Bilateral 1995   bilateral   . CATARACT EXTRACTION W/ INTRAOCULAR LENS  IMPLANT, BILATERAL Bilateral   . INSERT / REPLACE / REMOVE PACEMAKER  06/03/2017  . PACEMAKER IMPLANT N/A 06/03/2017   Procedure: Pacemaker Implant;  Surgeon: Duke Salvia, MD;  Location: [facility];  Service: Cardiovascular;  Laterality: N/A;  . THROAT SURGERY  1980s   "nodules in my throat"  . VAGINAL DELIVERY  1963   X 1  . VAGINAL HYSTERECTOMY  1974    Family History  Problem Relation Age of Onset  . Cancer Father        colon  . Heart attack Brother   . Diabetes Maternal Grandfather     Social  History   Socioeconomic History  . Marital status: Widowed    Spouse name: Not on file  . Number of children: 1  . Years of education: Not on file  . Highest education level: Not on file  Occupational History  . Occupation: Retired, [employer] Counselling psychologist)  Social Needs  . Financial resource strain: Not on file  . Food insecurity    Worry: Not on file    Inability: Not on file  . Transportation needs    Medical: Not on file    Non-medical: Not on file  Tobacco Use  . Smoking status: Current Some Day Smoker    Packs/day: 1.00    Years: 60.00    Pack years: 60.00    Types: Cigarettes  . Smokeless tobacco: Never Used  Substance and Sexual Activity  . Alcohol use: No  . Drug use: No  . Sexual activity: Never  Lifestyle  . Physical activity    Days per week: Not on file    Minutes per session: Not on file  . Stress: Not on file  Relationships  . Social Musician on phone: Not on file    Gets together: Not on file    Attends religious service: Not on file    Active member of club or organization: Not on file    Attends meetings of clubs or organizations: Not on file    Relationship status: Not on file  . Intimate partner violence    Fear of current or ex partner: Not on file    Emotionally abused: Not on file    Physically abused: Not on file    Forced sexual activity: Not on file  Other Topics Concern  . Not on file  Social History Narrative   Husband died September 09, 2023      Has living will   Has health care POA--- Chaya Jan, friend   Requests DNR--- form done 12/22/14   No tube feedings if cognitively unaware   Review of Systems Only to grocery store --wears mask No weight loss No other visits--no recent antibiotics    Objective:   Physical Exam  Constitutional: She appears well-developed. No distress.  Respiratory: Effort normal. No respiratory distress.  GI:  No tenderness when she palpates  Psychiatric:  No overt depression but voices  frustration--no purpose again           Assessment & Plan:

## 2019-07-26 NOTE — Assessment & Plan Note (Signed)
No pain Some loose stools in the past few days No fever Unclear if this could be a bug--or something metabolic Will try ondansetron Needs visit (here or urgent care) if doesn't resolve Okay to eat nabs for now--or whatever she tolerates

## 2019-07-29 ENCOUNTER — Telehealth: Payer: Self-pay | Admitting: Internal Medicine

## 2019-07-29 NOTE — Telephone Encounter (Signed)
Patient stated that she was suppose to have a nausea medication sent to her pharmacy on Tuesday when she had her visit.  She contacted the pharmacy and they have not received any prescriptions from [place]   [place]

## 2019-08-01 MED ORDER — ONDANSETRON HCL 4 MG PO TABS: 4.0000 mg | ORAL_TABLET | Freq: Three times a day (TID) | ORAL | 0 refills | Status: DC | PRN

## 2019-08-01 NOTE — Telephone Encounter (Signed)
Spoke to pt. She said her nausea a little better but not completely gone. Diarrhea has cleared. Thought she was supposed to be getting something for nausea, but I do not see anything was sent.

## 2019-08-01 NOTE — Telephone Encounter (Signed)
Please let her know I am sorry--must have forgotten to send it I did send ondansetron for prn use now

## 2019-08-01 NOTE — Telephone Encounter (Signed)
Spoke to pt

## 2019-09-08 ENCOUNTER — Encounter: Payer: Self-pay | Admitting: Internal Medicine

## 2019-09-08 ENCOUNTER — Ambulatory Visit (INDEPENDENT_AMBULATORY_CARE_PROVIDER_SITE_OTHER): Payer: Medicare Other | Admitting: Internal Medicine

## 2019-09-08 VITALS — BP 138/78 | HR 60 | Temp 98.2°F | Ht 62.5 in | Wt 141.0 lb

## 2019-09-08 DIAGNOSIS — Z0000 Encounter for general adult medical examination without abnormal findings: Secondary | ICD-10-CM | POA: Diagnosis not present

## 2019-09-08 DIAGNOSIS — Z7189 Other specified counseling: Secondary | ICD-10-CM | POA: Diagnosis not present

## 2019-09-08 DIAGNOSIS — I480 Paroxysmal atrial fibrillation: Secondary | ICD-10-CM

## 2019-09-08 DIAGNOSIS — I10 Essential (primary) hypertension: Secondary | ICD-10-CM | POA: Diagnosis not present

## 2019-09-08 DIAGNOSIS — J449 Chronic obstructive pulmonary disease, unspecified: Secondary | ICD-10-CM | POA: Diagnosis not present

## 2019-09-08 DIAGNOSIS — I700 Atherosclerosis of aorta: Secondary | ICD-10-CM | POA: Diagnosis not present

## 2019-09-08 DIAGNOSIS — F39 Unspecified mood [affective] disorder: Secondary | ICD-10-CM | POA: Diagnosis not present

## 2019-09-08 MED ORDER — GABAPENTIN 300 MG PO CAPS: 900.0000 mg | ORAL_CAPSULE | Freq: Every day | ORAL | 11 refills | Status: DC

## 2019-09-08 MED ORDER — ATORVASTATIN CALCIUM 10 MG PO TABS: 10.0000 mg | ORAL_TABLET | Freq: Every day | ORAL | 3 refills | Status: DC

## 2019-09-08 NOTE — Assessment & Plan Note (Signed)
Mild symptoms Stable on Rx--not ready for more Rx

## 2019-09-08 NOTE — Assessment & Plan Note (Signed)
Chronic dysthymia and anxiety Was better when she was able to volunteer--hopefully that will happen again

## 2019-09-08 NOTE — Assessment & Plan Note (Signed)
On imaging Is on ASA Will start statin

## 2019-09-08 NOTE — Patient Instructions (Signed)
Please increase the gabapentin to 900mg  at bedtime (I will send a new prescription but you can take a 600 and 300 till they run out). If hat helps more with out sedation, you can even try 1200mg  at bedtime.

## 2019-09-08 NOTE — Progress Notes (Signed)
Subjective:    Patient ID: Heidi Carroll, female    DOB: [birth year], 78 y.o.   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---leaves her with no way to stay busy and useful Anxiety is still an issue Sleep is variable---not good very often (but she feels good when that happens) Will not go to sleep till midnight--then can sleep till 4-5 If she goes to sleep at 10--will awaken after only 2 hours Going through her stuff to donate what she doesn't need  No chest pain Did have palpitations once--with ER visit No dizziness or syncope No edema Noted aortic atherosclerosis on imaging--has never had cholesterol checked  Breathing is about the same Some bad days---will barely be able to anything without dyspnea No regular cough Does wheeze upon lying down some nights  No joint or back pain lately  Current Outpatient Medications on File Prior to Visit  Medication Sig Dispense Refill  . acetaminophen (TYLENOL) 500 MG tablet Take 500 mg by mouth daily as needed for moderate pain or headache.    . albuterol (VENTOLIN HFA) 108 (90 Base) MCG/ACT inhaler Inhale 2 puffs into the lungs every 6 (six) hours as needed for wheezing or shortness of breath. 18 g 1  . ALPRAZolam (XANAX) 0.25 MG tablet 1/2 to 1 by mouth twice a day as needed 60 tablet 0  . aspirin EC 81 MG tablet Take 81 mg by mouth every evening.     . Calcium Carbonate-Vitamin D (CALCIUM 600+D PO) Take 1 tablet by mouth every evening.     . diltiazem (CARDIZEM CD) 120 MG 24 hr capsule Take 1 capsule (120 mg total) by  mouth daily. 90 capsule 3  . flecainide (TAMBOCOR) 50 MG tablet Take 1 tablet (50 mg total) by mouth 2 (two) times daily. 180 tablet 2  . gabapentin (NEURONTIN) 600 MG tablet TAKE 1 TABLET BY MOUTH AT BEDTIME 90 tablet 2  . hydrALAZINE (APRESOLINE) 25 MG tablet Take one tablet (25 mg) by mouth every 6 hours as needed for a blood pressure >170 on the top number 90 tablet 0  . losartan (COZAAR) 50 MG tablet Take 1 tablet by mouth once daily 90 tablet 3  . ondansetron (ZOFRAN) 4 MG tablet Take 1 tablet (4 mg total) by mouth every 8 (eight) hours as needed for nausea or vomiting. 20 tablet 0   No current facility-administered medications on file prior to visit.     Allergies  Allergen Reactions  . Cymbalta [Duloxetine Hcl] Nausea Only  . Wellbutrin [Bupropion] Other (See Comments)    Elevated blood pressure, anxiety  . Zoloft [Sertraline Hcl] Nausea Only    Severe weight loss   . Nicotine Palpitations    Nicotine Patches   . Penicillins Rash    Has patient had a PCN reaction causing immediate rash, facial/tongue/throat swelling, SOB or lightheadedness with hypotension yes Has patient had a PCN reaction causing severe rash involving mucus membranes or skin necrosis: no Has patient had a PCN reaction that required hospitalization no Has patient had a PCN reaction occurring within the last 10 years: no If all of the above  answers are "NO", then may proceed with Cephalosporin use.    Past Medical History:  Diagnosis Date  . A-fib ([facility])   . Depression   . Hypertension   . Neuropathy    idiopathic  . Presence of permanent cardiac pacemaker     Past Surgical History:  Procedure Laterality Date  . A-FLUTTER ABLATION N/A 02/04/2017   Procedure: A-Flutter Ablation;  Surgeon: Duke Salvia, MD;  [facility];  Service: Cardiovascular;  Laterality: N/A;  . BUNIONECTOMY Bilateral 1995   bilateral   . CATARACT EXTRACTION W/ INTRAOCULAR LENS  IMPLANT, BILATERAL Bilateral   .  INSERT / REPLACE / REMOVE PACEMAKER  06/03/2017  . PACEMAKER IMPLANT N/A 06/03/2017   Procedure: Pacemaker Implant;  Surgeon: Duke Salvia, MD;  Location: [facility];  Service: Cardiovascular;  Laterality: N/A;  . THROAT SURGERY  1980s   "nodules in my throat"  . VAGINAL DELIVERY  1963   X 1  . VAGINAL HYSTERECTOMY  1974    Family History  Problem Relation Age of Onset  . Cancer Father        colon  . Heart attack Brother   . Diabetes Maternal Grandfather     Social History   Socioeconomic History  . Marital status: Widowed    Spouse name: Not on file  . Number of children: 1  . Years of education: Not on file  . Highest education level: Not on file  Occupational History  . Occupation: Retired, [employer] Counselling psychologist)  Social Needs  . Financial resource strain: Not on file  . Food insecurity    Worry: Not on file    Inability: Not on file  . Transportation needs    Medical: Not on file    Non-medical: Not on file  Tobacco Use  . Smoking status: Current Every Day Smoker    Packs/day: 1.00    Years: 60.00    Pack years: 60.00    Types: Cigarettes  . Smokeless tobacco: Never Used  Substance and Sexual Activity  . Alcohol use: No  . Drug use: No  . Sexual activity: Never  Lifestyle  . Physical activity    Days per week: Not on file    Minutes per session: Not on file  . Stress: Not on file  Relationships  . Social Musician on phone: Not on file    Gets together: Not on file    Attends religious service: Not on file    Active member of club or organization: Not on file    Attends meetings of clubs or organizations: Not on file    Relationship status: Not on file  . Intimate partner violence    Fear of current or ex partner: Not on file    Emotionally abused: Not on file    Physically abused: Not on file    Forced sexual activity: Not on file  Other Topics Concern  . Not on file  Social History Narrative   Husband died 09-07-2023      Has  living will   Has health care POA--- Chaya Jan, friend   Requests DNR--- form done 12/22/14   No tube feedings if cognitively unaware   Review of Systems Appetite is fair Weight is stable Wears seat belt Full dentures--but they don't fit well ( and gets gagged easily) No rash or suspicious skin lesions No heartburn or dysphagia Bowels are fine--no blood     Objective:  Physical Exam  Constitutional: She is oriented to person, place, and time. She appears well-developed. No distress.  HENT:  Mouth/Throat: Oropharynx is clear and moist. No oropharyngeal exudate.  Neck: No thyromegaly present.  Cardiovascular: Normal rate, regular rhythm, normal heart sounds and intact distal pulses. Exam reveals no gallop.  No murmur heard. Respiratory: Effort normal. No respiratory distress. She has no wheezes. She has no rales.  Mildly decreased breath sounds but clear  GI: Soft. There is no abdominal tenderness.  Musculoskeletal:        General: No tenderness or edema.  Lymphadenopathy:    She has no cervical adenopathy.  Neurological: She is alert and oriented to person, place, and time.  President--- "Dwaine Deter, [place]" [phone] [id number] Recall 3/3  Skin: No rash noted. No erythema.  Psychiatric:  Mild depression but normal appearance, speech and interaction           Assessment & Plan:

## 2019-09-08 NOTE — Progress Notes (Signed)
Hearing Screening   Method: Audiometry   125Hz  250Hz  500Hz  1000Hz  2000Hz  3000Hz  4000Hz  6000Hz  8000Hz   Right ear:   20 20 20  20     Left ear:   20 20 20  20       Visual Acuity Screening   Right eye Left eye Both eyes  Without correction:     With correction: 20/25 20/25 20/20

## 2019-09-08 NOTE — Assessment & Plan Note (Signed)
Has DNR 

## 2019-09-08 NOTE — Assessment & Plan Note (Signed)
BP Readings from Last 3 Encounters:  09/08/19 138/78  07/26/19 (!) 149/71  04/26/19 (!) 161/86   Good control

## 2019-09-08 NOTE — Assessment & Plan Note (Signed)
I have personally reviewed the Medicare Annual Wellness questionnaire and have noted 1. The patient's medical and social history 2. Their use of alcohol, tobacco or illicit drugs 3. Their current medications and supplements 4. The patient's functional ability including ADL's, fall risks, home safety risks and hearing or visual             impairment. 5. Diet and physical activities 6. Evidence for depression or mood disorders  The patients weight, height, BMI and visual acuity have been recorded in the chart I have made referrals, counseling and provided education to the patient based review of the above and I have provided the pt with a written personalized care plan for preventive services.  I have provided you with a copy of your personalized plan for preventive services. Please take the time to review along with your updated medication list.  Will update flu vaccine today No cancer screening by her choice (and over 75) Consider shingrix--but cost is an issue Just can't stop smoking Discussed exercise

## 2019-09-08 NOTE — Assessment & Plan Note (Signed)
No clear recurrence Keeps up with Dr Caryl Comes

## 2019-09-09 LAB — COMPREHENSIVE METABOLIC PANEL
ALT: 7 U/L (ref 0–35)
AST: 12 U/L (ref 0–37)
Albumin: 4.2 g/dL (ref 3.5–5.2)
Alkaline Phosphatase: 89 U/L (ref 39–117)
BUN: 14 mg/dL (ref 6–23)
CO2: 26 mEq/L (ref 19–32)
Calcium: 9.7 mg/dL (ref 8.4–10.5)
Chloride: 105 mEq/L (ref 96–112)
Creatinine, Ser: 0.77 mg/dL (ref 0.40–1.20)
GFR: 72.63 mL/min (ref >60.00)
Glucose, Bld: 89 mg/dL (ref 70–99)
Potassium: 4.8 mEq/L (ref 3.5–5.1)
Sodium: 142 mEq/L (ref 135–145)
Total Bilirubin: 0.4 mg/dL (ref 0.2–1.2)
Total Protein: 6.9 g/dL (ref 6.0–8.3)

## 2019-09-09 LAB — LIPID PANEL
Cholesterol: 162 mg/dL (ref 0–200)
HDL: 51.8 mg/dL (ref >39.00)
LDL Cholesterol: 89 mg/dL (ref 0–99)
NonHDL: 110.48
Total CHOL/HDL Ratio: 3
Triglycerides: 109 mg/dL (ref 0.0–149.0)
VLDL: 21.8 mg/dL (ref 0.0–40.0)

## 2019-09-09 LAB — CBC
HCT: 51.1 % — ABNORMAL HIGH (ref 36.0–46.0)
Hemoglobin: 17.3 g/dL — ABNORMAL HIGH (ref 12.0–15.0)
MCHC: 33.8 g/dL (ref 30.0–36.0)
MCV: 98.5 fl (ref 78.0–100.0)
Platelets: 197 10*3/uL (ref 150.0–400.0)
RBC: 5.19 Mil/uL — ABNORMAL HIGH (ref 3.87–5.11)
RDW: 14 % (ref 11.5–15.5)
WBC: 9.6 10*3/uL (ref 4.0–10.5)

## 2019-09-09 LAB — T4, FREE: Free T4: 1.02 ng/dL (ref 0.60–1.60)

## 2019-09-10 ENCOUNTER — Other Ambulatory Visit: Payer: Self-pay | Admitting: Internal Medicine

## 2019-09-19 ENCOUNTER — Other Ambulatory Visit: Payer: Self-pay

## 2019-10-11 ENCOUNTER — Ambulatory Visit (INDEPENDENT_AMBULATORY_CARE_PROVIDER_SITE_OTHER): Payer: Medicare Other | Admitting: *Deleted

## 2019-10-11 DIAGNOSIS — I495 Sick sinus syndrome: Secondary | ICD-10-CM

## 2019-10-11 LAB — CUP PACEART REMOTE DEVICE CHECK
Battery Remaining Longevity: 125 mo
Battery Voltage: 3.02 V
Brady Statistic AP VP Percent: 0.11 %
Brady Statistic AP VS Percent: 77.81 %
Brady Statistic AS VP Percent: 0.02 %
Brady Statistic AS VS Percent: 22.07 %
Brady Statistic RA Percent Paced: 85.1 %
Brady Statistic RV Percent Paced: 0.12 %
Date Time Interrogation Session: 20201214233143
Implantable Lead Implant Date: 20180808
Implantable Lead Implant Date: 20180808
Implantable Lead Location: 753859
Implantable Lead Location: 753860
Implantable Lead Model: 3830
Implantable Lead Model: 5076
Implantable Pulse Generator Implant Date: 20180808
Lead Channel Impedance Value: 285 Ohm
Lead Channel Impedance Value: 342 Ohm
Lead Channel Impedance Value: 418 Ohm
Lead Channel Impedance Value: 551 Ohm
Lead Channel Pacing Threshold Amplitude: 0.875 V
Lead Channel Pacing Threshold Amplitude: 1.125 V
Lead Channel Pacing Threshold Pulse Width: 0.4 ms
Lead Channel Pacing Threshold Pulse Width: 0.4 ms
Lead Channel Sensing Intrinsic Amplitude: 2.625 mV
Lead Channel Sensing Intrinsic Amplitude: 2.625 mV
Lead Channel Sensing Intrinsic Amplitude: 8.5 mV
Lead Channel Sensing Intrinsic Amplitude: 8.5 mV
Lead Channel Setting Pacing Amplitude: 2 V
Lead Channel Setting Pacing Amplitude: 2.5 V
Lead Channel Setting Pacing Pulse Width: 0.4 ms
Lead Channel Setting Sensing Sensitivity: 0.9 mV

## 2019-10-28 ENCOUNTER — Other Ambulatory Visit: Payer: Self-pay | Admitting: Internal Medicine

## 2019-11-02 ENCOUNTER — Other Ambulatory Visit: Payer: Self-pay | Admitting: Internal Medicine

## 2019-11-02 MED ORDER — FLECAINIDE ACETATE 50 MG PO TABS: 50.0000 mg | ORAL_TABLET | Freq: Two times a day (BID) | ORAL | 0 refills | Status: DC

## 2019-11-02 NOTE — Telephone Encounter (Signed)
Requested Prescriptions   Signed Prescriptions Disp Refills   flecainide (TAMBOCOR) 50 MG tablet 180 tablet 0    Sig: Take 1 tablet (50 mg total) by mouth 2 (two) times daily.    Authorizing Provider: Duke Salvia    Ordering User: Thayer Headings, Marky Buresh L

## 2019-11-02 NOTE — Telephone Encounter (Signed)
*  STAT* If patient is at the pharmacy, call can be transferred to refill team.   1. Which medications need to be refilled? (please list name of each medication and dose if known) Flecainide 50 mg po BID  2. Which pharmacy/location (including street and city if local pharmacy) is medication to be sent to? [facility] [address]   3. Do they need a 30 day or 90 day supply? 90  Patient scheduled for February and will run out before.

## 2019-11-12 NOTE — Progress Notes (Signed)
PPM remote 

## 2019-11-16 ENCOUNTER — Other Ambulatory Visit: Payer: Self-pay | Admitting: Internal Medicine

## 2019-11-20 ENCOUNTER — Other Ambulatory Visit: Payer: Self-pay | Admitting: Internal Medicine

## 2019-12-20 ENCOUNTER — Encounter: Payer: Medicare Other | Admitting: Internal Medicine

## 2019-12-25 ENCOUNTER — Ambulatory Visit: Payer: Medicare Other | Attending: Internal Medicine

## 2019-12-25 DIAGNOSIS — Z23 Encounter for immunization: Secondary | ICD-10-CM | POA: Insufficient documentation

## 2019-12-25 NOTE — Progress Notes (Signed)
   Covid-19 Vaccination Clinic  Name:  Heidi Carroll    MRN: [id number] DOB: [birth year]  12/25/2019  [name] was observed post Covid-19 immunization for 15 minutes without incidence. She was provided with Vaccine Information Sheet and instruction to access the V-Safe system.   [name] was instructed to call 911 with any severe reactions post vaccine: Marland Kitchen Difficulty breathing  . Swelling of your face and throat  . A fast heartbeat  . A bad rash all over your body  . Dizziness and weakness    Immunizations Administered    Name Date Dose VIS Date Route   Pfizer COVID-19 Vaccine 12/25/2019  2:06 PM 0.3 mL 10/07/2019 Intramuscular   Manufacturer: [employer], [employer]   Lot: [id number]   NDC: [address]-6

## 2020-01-10 ENCOUNTER — Ambulatory Visit (INDEPENDENT_AMBULATORY_CARE_PROVIDER_SITE_OTHER): Payer: Medicare Other | Admitting: *Deleted

## 2020-01-10 DIAGNOSIS — I495 Sick sinus syndrome: Secondary | ICD-10-CM

## 2020-01-10 LAB — CUP PACEART REMOTE DEVICE CHECK
Battery Remaining Longevity: 122 mo
Battery Voltage: 3.02 V
Brady Statistic AP VP Percent: 0.1 %
Brady Statistic AP VS Percent: 79.78 %
Brady Statistic AS VP Percent: 0.01 %
Brady Statistic AS VS Percent: 20.11 %
Brady Statistic RA Percent Paced: 87.7 %
Brady Statistic RV Percent Paced: 0.11 %
Date Time Interrogation Session: 20210316003252
Implantable Lead Implant Date: 20180808
Implantable Lead Implant Date: 20180808
Implantable Lead Location: 753859
Implantable Lead Location: 753860
Implantable Lead Model: 3830
Implantable Lead Model: 5076
Implantable Pulse Generator Implant Date: 20180808
Lead Channel Impedance Value: 285 Ohm
Lead Channel Impedance Value: 342 Ohm
Lead Channel Impedance Value: 418 Ohm
Lead Channel Impedance Value: 532 Ohm
Lead Channel Pacing Threshold Amplitude: 0.75 V
Lead Channel Pacing Threshold Amplitude: 1 V
Lead Channel Pacing Threshold Pulse Width: 0.4 ms
Lead Channel Pacing Threshold Pulse Width: 0.4 ms
Lead Channel Sensing Intrinsic Amplitude: 2 mV
Lead Channel Sensing Intrinsic Amplitude: 2 mV
Lead Channel Sensing Intrinsic Amplitude: 8.375 mV
Lead Channel Sensing Intrinsic Amplitude: 8.375 mV
Lead Channel Setting Pacing Amplitude: 2 V
Lead Channel Setting Pacing Amplitude: 2.5 V
Lead Channel Setting Pacing Pulse Width: 0.4 ms
Lead Channel Setting Sensing Sensitivity: 0.9 mV

## 2020-01-10 NOTE — Progress Notes (Signed)
PPM Remote  

## 2020-01-18 ENCOUNTER — Ambulatory Visit: Payer: Medicare Other | Attending: Internal Medicine

## 2020-01-18 DIAGNOSIS — Z23 Encounter for immunization: Secondary | ICD-10-CM

## 2020-01-18 NOTE — Progress Notes (Signed)
   Covid-19 Vaccination Clinic  Name:  Heidi Carroll    MRN: [id number] DOB: 1941-09-08  01/18/2020  [name] was observed post Covid-19 immunization for 15 minutes without incident. She was provided with Vaccine Information Sheet and instruction to access the V-Safe system.   [name] was instructed to call 911 with any severe reactions post vaccine: Marland Kitchen Difficulty breathing  . Swelling of face and throat  . A fast heartbeat  . A bad rash all over body  . Dizziness and weakness   Immunizations Administered    Name Date Dose VIS Date Route   Pfizer COVID-19 Vaccine 01/18/2020  1:30 PM 0.3 mL 10/07/2019 Intramuscular   Manufacturer: [employer], [employer]   Lot: [id number]   NDC: [address]-3

## 2020-01-19 ENCOUNTER — Telehealth: Payer: Self-pay

## 2020-01-19 NOTE — Telephone Encounter (Signed)
Form done No charge 

## 2020-01-19 NOTE — Telephone Encounter (Signed)
Received a form in my box for Handicap Placard. Fillied out and sent to Dr Alphonsus Sias to sign.

## 2020-01-19 NOTE — Telephone Encounter (Signed)
Patient notified form completed.  Form mailed to patient.

## 2020-01-31 ENCOUNTER — Encounter: Payer: Self-pay | Admitting: Internal Medicine

## 2020-01-31 ENCOUNTER — Other Ambulatory Visit: Payer: Self-pay

## 2020-01-31 ENCOUNTER — Ambulatory Visit (INDEPENDENT_AMBULATORY_CARE_PROVIDER_SITE_OTHER): Payer: Medicare Other | Admitting: Internal Medicine

## 2020-01-31 VITALS — BP 146/74 | HR 82 | Ht 62.5 in | Wt 140.0 lb

## 2020-01-31 DIAGNOSIS — I10 Essential (primary) hypertension: Secondary | ICD-10-CM

## 2020-01-31 DIAGNOSIS — I495 Sick sinus syndrome: Secondary | ICD-10-CM | POA: Diagnosis not present

## 2020-01-31 DIAGNOSIS — Z950 Presence of cardiac pacemaker: Secondary | ICD-10-CM

## 2020-01-31 DIAGNOSIS — I480 Paroxysmal atrial fibrillation: Secondary | ICD-10-CM

## 2020-01-31 DIAGNOSIS — I700 Atherosclerosis of aorta: Secondary | ICD-10-CM

## 2020-01-31 DIAGNOSIS — I483 Typical atrial flutter: Secondary | ICD-10-CM

## 2020-01-31 NOTE — Patient Instructions (Signed)
Medication Instructions:  - Your physician has recommended you make the following change in your medication:   1) STOP lipitor (atorvastatin)  *If you need a refill on your cardiac medications before your next appointment, please call your pharmacy*   Lab Work: - Your physician recommends that you return for FASTING lab work in: 3 months (around early July)- Lipid panel  -Come to the [employer], 1st desk on the right to check in (past the screening table) - Lab hours: Monday- Friday (7:30 am- 5:30 pm) - Do not eat/ drink anything for 8 hours prior, except for water or black coffee  If you have labs (blood work) drawn today and your tests are completely normal, you will receive your results only by: Marland Kitchen MyChart Message (if you have MyChart) OR . A paper copy in the mail If you have any lab test that is abnormal or we need to change your treatment, we will call you to review the results.   Testing/Procedures: - none ordered   Follow-Up: At [facility], you and your health needs are our priority.  As part of our continuing mission to provide you with exceptional heart care, we have created designated Provider Care Teams.  These Care Teams include your primary Cardiologist (physician) and Advanced Practice Providers (APPs -  Physician Assistants and Nurse Practitioners) who all work together to provide you with the care you need, when you need it.  We recommend signing up for the patient portal called "MyChart".  Sign up information is provided on this After Visit Summary.  MyChart is used to connect with patients for Virtual Visits (Telemedicine).  Patients are able to view lab/test results, encounter notes, upcoming appointments, etc.  Non-urgent messages can be sent to your provider as well.   To learn more about what you can do with MyChart, go to [email or web address].    Your next appointment:   1 year(s)  The format for your next appointment:   In  Person  Provider:   Sherryl Manges, MD   Other Instructions n/a

## 2020-01-31 NOTE — Progress Notes (Signed)
Patient Care Team: Venia Carbon, MD as PCP - General (Internal Medicine)   HPI  Heidi Carroll is a 79 y.o. female Seen in follow-up for atrial flutter for which she underwent catheter ablation 4/18. She also has sinus node dysfunction and underwent pacing 8/18 Medtronic   Echocardiogram 1/18 EF normal 55-65%. Palpitations>> underwent Holter monitoring>>> PVC                       Non sustained atrial tachycardia  180 bpm but only 4 beats                       There is some suggestion of atrial tach, not fast about 110    On flecainide for her palpitations.  Much improved  Her biggest complaint is that she struggles with sleeping.  If she does not sleep she has issues of chest pain shortness of breath; but following a good sleep she has no complaints  DATE PR interval QRSduration Dose-Flecainide  5/18  132 90 0  8/18 NA 88 50  11/18 Ap 238 98 50  12/19 Ap 27 88 50          Past Medical History:  Diagnosis Date  . A-fib ([place])   . Depression   . Hypertension   . Neuropathy    idiopathic  . Presence of permanent cardiac pacemaker     Past Surgical History:  Procedure Laterality Date  . A-FLUTTER ABLATION N/A 02/04/2017   Procedure: A-Flutter Ablation;  Surgeon: Deboraha Sprang, MD;  Location: [facility];  Service: Cardiovascular;  Laterality: N/A;  . BUNIONECTOMY Bilateral 1995   bilateral   . CATARACT EXTRACTION W/ INTRAOCULAR LENS  IMPLANT, BILATERAL Bilateral   . INSERT / REPLACE / REMOVE PACEMAKER  06/03/2017  . PACEMAKER IMPLANT N/A 06/03/2017   Procedure: Pacemaker Implant;  Surgeon: Deboraha Sprang, MD;  Location: [facility];  Service: Cardiovascular;  Laterality: N/A;  . THROAT SURGERY  1980s   "nodules in my throat"  . VAGINAL DELIVERY  1963   X 1  . VAGINAL HYSTERECTOMY  1974    Current Outpatient Medications  Medication Sig Dispense Refill  . acetaminophen (TYLENOL) 500 MG tablet Take 500 mg by mouth daily as needed for  moderate pain or headache.    . albuterol (VENTOLIN HFA) 108 (90 Base) MCG/ACT inhaler INHALE 2 PUFFS BY MOUTH EVERY 6 HOURS AS NEEDED FOR WHEEZING FOR SHORTNESS OF BREATH 18 g 3  . ALPRAZolam (XANAX) 0.25 MG tablet 1/2 to 1 by mouth twice a day as needed 60 tablet 0  . aspirin EC 81 MG tablet Take 81 mg by mouth every evening.     Marland Kitchen atorvastatin (LIPITOR) 10 MG tablet Take 1 tablet (10 mg total) by mouth daily. 90 tablet 3  . Calcium Carbonate-Vitamin D (CALCIUM 600+D PO) Take 1 tablet by mouth every evening.     . diltiazem (CARDIZEM CD) 120 MG 24 hr capsule Take 1 capsule by mouth once daily 90 capsule 1  . flecainide (TAMBOCOR) 50 MG tablet Take 1 tablet (50 mg total) by mouth 2 (two) times daily. 180 tablet 0  . gabapentin (NEURONTIN) 300 MG capsule Take 3-4 capsules (900-1,200 mg total) by mouth at bedtime. 120 capsule 11  . hydrALAZINE (APRESOLINE) 25 MG tablet Take one tablet (25 mg) by mouth every 6 hours as needed for a blood pressure >170 on the  top number 90 tablet 0  . losartan (COZAAR) 50 MG tablet Take 1 tablet by mouth once daily 90 tablet 3  . ondansetron (ZOFRAN) 4 MG tablet Take 1 tablet (4 mg total) by mouth every 8 (eight) hours as needed for nausea or vomiting. 20 tablet 0   No current facility-administered medications for this visit.    Allergies  Allergen Reactions  . Cymbalta [Duloxetine Hcl] Nausea Only  . Wellbutrin [Bupropion] Other (See Comments)    Elevated blood pressure, anxiety  . Zoloft [Sertraline Hcl] Nausea Only    Severe weight loss   . Nicotine Palpitations    Nicotine Patches   . Penicillins Rash    Has patient had a PCN reaction causing immediate rash, facial/tongue/throat swelling, SOB or lightheadedness with hypotension yes Has patient had a PCN reaction causing severe rash involving mucus membranes or skin necrosis: no Has patient had a PCN reaction that required hospitalization no Has patient had a PCN reaction occurring within the last 10  years: no If all of the above answers are "NO", then may proceed with Cephalosporin use.      Review of Systems negative except from HPI and PMH  Physical Exam BP (!) 146/74 (BP Location: Left Arm, Patient Position: Sitting, Cuff Size: Normal)   Pulse 82   Ht 5' 2.5" (1.588 m)   Wt 140 lb (63.5 kg)   SpO2 93%   BMI 25.20 kg/m  Well developed and well nourished in no acute distress HENT normal Neck supple with JVP-flat Clear Device pocket well healed; without hematoma or erythema.  There is no tethering  Irregular rate and rhythm, no  murmur Abd-soft with active BS No Clubbing cyanosis   edema Skin-warm and dry A & Oriented  Grossly normal sensory and motor function  ECG sinus @ 82 24/09/39 PVC frequent inferior axis   Assessment and  Plan  Atrial flutter status post ablation  AFib paroxysmal/SCAF   Palpitations with PVCs and PACs  Hypertension  Anxiety  /depression  Fatigue  Pacemaker Medtronic The patient's device was interrogated.  The information was reviewed. No changes were made in the programming.   No intercurrent atrial fibrillation or flutter  PVC burden increased>> 2.5 / min  Will follow  BP well controlled  No anticoagulation given brevity of episodes

## 2020-02-24 ENCOUNTER — Other Ambulatory Visit: Payer: Self-pay | Admitting: Internal Medicine

## 2020-02-24 NOTE — Telephone Encounter (Signed)
This is a [facility] pt 

## 2020-03-28 ENCOUNTER — Other Ambulatory Visit: Payer: Self-pay | Admitting: Internal Medicine

## 2020-03-30 ENCOUNTER — Ambulatory Visit (INDEPENDENT_AMBULATORY_CARE_PROVIDER_SITE_OTHER): Payer: Medicare Other | Admitting: Internal Medicine

## 2020-03-30 ENCOUNTER — Encounter: Payer: Self-pay | Admitting: Internal Medicine

## 2020-03-30 ENCOUNTER — Other Ambulatory Visit: Payer: Self-pay

## 2020-03-30 VITALS — BP 130/80 | HR 62 | Temp 97.4°F | Ht 63.0 in | Wt 137.0 lb

## 2020-03-30 DIAGNOSIS — F329 Major depressive disorder, single episode, unspecified: Secondary | ICD-10-CM | POA: Diagnosis not present

## 2020-03-30 DIAGNOSIS — F330 Major depressive disorder, recurrent, mild: Secondary | ICD-10-CM

## 2020-03-30 DIAGNOSIS — R5383 Other fatigue: Secondary | ICD-10-CM | POA: Insufficient documentation

## 2020-03-30 DIAGNOSIS — I480 Paroxysmal atrial fibrillation: Secondary | ICD-10-CM | POA: Diagnosis not present

## 2020-03-30 DIAGNOSIS — F32A Depression, unspecified: Secondary | ICD-10-CM

## 2020-03-30 DIAGNOSIS — F39 Unspecified mood [affective] disorder: Secondary | ICD-10-CM | POA: Diagnosis not present

## 2020-03-30 DIAGNOSIS — J449 Chronic obstructive pulmonary disease, unspecified: Secondary | ICD-10-CM

## 2020-03-30 DIAGNOSIS — F339 Major depressive disorder, recurrent, unspecified: Secondary | ICD-10-CM | POA: Insufficient documentation

## 2020-03-30 LAB — RENAL FUNCTION PANEL
Albumin: 4.3 g/dL (ref 3.5–5.2)
BUN: 16 mg/dL (ref 6–23)
CO2: 32 mEq/L (ref 19–32)
Calcium: 9.6 mg/dL (ref 8.4–10.5)
Chloride: 102 mEq/L (ref 96–112)
Creatinine, Ser: 0.74 mg/dL (ref 0.40–1.20)
GFR: 75.93 mL/min (ref >60.00)
Glucose, Bld: 96 mg/dL (ref 70–99)
Phosphorus: 3.6 mg/dL (ref 2.3–4.6)
Potassium: 4.4 mEq/L (ref 3.5–5.1)
Sodium: 140 mEq/L (ref 135–145)

## 2020-03-30 LAB — HEPATIC FUNCTION PANEL
ALT: 9 U/L (ref 0–35)
AST: 14 U/L (ref 0–37)
Albumin: 4.3 g/dL (ref 3.5–5.2)
Alkaline Phosphatase: 97 U/L (ref 39–117)
Bilirubin, Direct: 0.1 mg/dL (ref 0.0–0.3)
Total Bilirubin: 0.4 mg/dL (ref 0.2–1.2)
Total Protein: 6.6 g/dL (ref 6.0–8.3)

## 2020-03-30 LAB — SEDIMENTATION RATE: Sed Rate: 6 mm/hr (ref 0–30)

## 2020-03-30 LAB — T4, FREE: Free T4: 0.94 ng/dL (ref 0.60–1.60)

## 2020-03-30 MED ORDER — TRAZODONE HCL 50 MG PO TABS: 50.0000 mg | ORAL_TABLET | Freq: Every day | ORAL | 11 refills | Status: DC

## 2020-03-30 NOTE — Assessment & Plan Note (Signed)
Especially tough with the humidity Still smoking unfortunately ---uses this to help her cope

## 2020-03-30 NOTE — Progress Notes (Signed)
Subjective:    Patient ID: Heidi Carroll, female    DOB: 1941/02/10, 79 y.o.   MRN: [id number]  HPI Here due to fatigue and anxiety This visit occurred during the SARS-CoV-2 public health emergency.  Safety protocols were in place, including screening questions prior to the visit, additional usage of staff PPE, and extensive cleaning of exam room while observing appropriate contact time as indicated for disinfecting solutions.   "I think it's my fault" Does not have the incentive to get up and do things "If I do, I do--if I don't, I don't" Gets out a little--but still limited Doesn't walk dog but takes it out for a ride  Nursing facility still not opening for volunteers yet But "I don't care"  Having panic attacks again--doesn't know any trigger Has had 3-4 in past 2 weeks Will use the xanax prn and it still helps (1/2 tab does it)  Going downhill since son died---2005 Had husband till he passed 2014 (and did everything for him)  Trouble sleeping Still gets up often at night---same schedule as when husband was ill and needed care Wine didn't help Melatonin helps her initiate--but not maintain (10mg ) Does feel better if she can get 4-5 hours of sleep Often gets out of bed by 4AM if can't get back to sleep Mirtazapine didn't help  Nothing is enjoyable No suicidal ideation  Fatigue is related "to the mental"  No palpitations No chest pain No SOB No dizziness or syncope  Breathing is pretty good except for the humidity Then SOB  Current Outpatient Medications on File Prior to Visit  Medication Sig Dispense Refill  . acetaminophen (TYLENOL) 500 MG tablet Take 500 mg by mouth daily as needed for moderate pain or headache.    . albuterol (VENTOLIN HFA) 108 (90 Base) MCG/ACT inhaler INHALE 2 PUFFS BY MOUTH EVERY 6 HOURS AS NEEDED FOR WHEEZING FOR SHORTNESS OF BREATH 18 g 3  . ALPRAZolam (XANAX) 0.25 MG tablet 1/2 to 1 by mouth twice a day as needed 60 tablet 0  . aspirin  EC 81 MG tablet Take 81 mg by mouth every evening.     . Calcium Carbonate-Vitamin D (CALCIUM 600+D PO) Take 1 tablet by mouth every evening.     . diltiazem (CARDIZEM CD) 120 MG 24 hr capsule Take 1 capsule by mouth once daily 90 capsule 1  . flecainide (TAMBOCOR) 50 MG tablet Take 1 tablet (50 mg total) by mouth 2 (two) times daily. 180 tablet 2  . gabapentin (NEURONTIN) 300 MG capsule Take 3-4 capsules (900-1,200 mg total) by mouth at bedtime. 120 capsule 11  . hydrALAZINE (APRESOLINE) 25 MG tablet Take one tablet (25 mg) by mouth every 6 hours as needed for a blood pressure >170 on the top number 90 tablet 0  . losartan (COZAAR) 50 MG tablet Take 1 tablet by mouth once daily 90 tablet 1  . ondansetron (ZOFRAN) 4 MG tablet Take 1 tablet (4 mg total) by mouth every 8 (eight) hours as needed for nausea or vomiting. 20 tablet 0   No current facility-administered medications on file prior to visit.    Allergies  Allergen Reactions  . Cymbalta [Duloxetine Hcl] Nausea Only  . Wellbutrin [Bupropion] Other (See Comments)    Elevated blood pressure, anxiety  . Zoloft [Sertraline Hcl] Nausea Only    Severe weight loss   . Nicotine Palpitations    Nicotine Patches   . Penicillins Rash    Has patient had a PCN  reaction causing immediate rash, facial/tongue/throat swelling, SOB or lightheadedness with hypotension yes Has patient had a PCN reaction causing severe rash involving mucus membranes or skin necrosis: no Has patient had a PCN reaction that required hospitalization no Has patient had a PCN reaction occurring within the last 10 years: no If all of the above answers are "NO", then may proceed with Cephalosporin use.    Past Medical History:  Diagnosis Date  . A-fib ([facility])   . Depression   . Hypertension   . Neuropathy    idiopathic  . Presence of permanent cardiac pacemaker     Past Surgical History:  Procedure Laterality Date  . A-FLUTTER ABLATION N/A 02/04/2017   Procedure:  A-Flutter Ablation;  Surgeon: Duke Salvia, MD;  Location: [facility];  Service: Cardiovascular;  Laterality: N/A;  . BUNIONECTOMY Bilateral 1995   bilateral   . CATARACT EXTRACTION W/ INTRAOCULAR LENS  IMPLANT, BILATERAL Bilateral   . INSERT / REPLACE / REMOVE PACEMAKER  06/03/2017  . PACEMAKER IMPLANT N/A 06/03/2017   Procedure: Pacemaker Implant;  Surgeon: Duke Salvia, MD;  Location: [facility] INVASIVE [facility];  Service: Cardiovascular;  Laterality: N/A;  . THROAT SURGERY  1980s   "nodules in my throat"  . VAGINAL DELIVERY  1963   X 1  . VAGINAL HYSTERECTOMY  1974    Family History  Problem Relation Age of Onset  . Cancer Father        colon  . Heart attack Brother   . Diabetes Maternal Grandfather     Social History   Socioeconomic History  . Marital status: Widowed    Spouse name: Not on file  . Number of children: 1  . Years of education: Not on file  . Highest education level: Not on file  Occupational History  . Occupation: Retired, [employer] Counselling psychologist)  Tobacco Use  . Smoking status: Current Every Day Smoker    Packs/day: 1.00    Years: 60.00    Pack years: 60.00    Types: Cigarettes  . Smokeless tobacco: Never Used  Substance and Sexual Activity  . Alcohol use: No  . Drug use: No  . Sexual activity: Never  Other Topics Concern  . Not on file  Social History Narrative   Husband died 2023-09-01      Has living will   Has health care POA--- Chaya Jan, friend   Requests DNR--- form done 12/22/14   No tube feedings if cognitively unaware   Social Determinants of Health   Financial Resource Strain:   . Difficulty of Paying Living Expenses:   Food Insecurity:   . Worried About Programme researcher, broadcasting/film/video in the Last Year:   . Barista in the Last Year:   Transportation Needs:   . Freight forwarder (Medical):   Marland Kitchen Lack of Transportation (Non-Medical):   Physical Activity:   . Days of Exercise per Week:   . Minutes of Exercise per Session:     Stress:   . Feeling of Stress :   Social Connections:   . Frequency of Communication with Friends and Family:   . Frequency of Social Gatherings with Friends and Family:   . Attends Religious Services:   . Active Member of Clubs or Organizations:   . Attends Banker Meetings:   Marland Kitchen Marital Status:   Intimate Partner Violence:   . Fear of Current or Ex-Partner:   . Emotionally Abused:   Marland Kitchen Physically Abused:   .  Sexually Abused:    Review of Systems Eating okay--usually just 2 meals a day Weight down just slightly No family ----hopes to see neighbors more as things open up    Objective:   Physical Exam  Constitutional: No distress.  HENT:  No oral lesions  Cardiovascular: Normal rate and normal heart sounds. Exam reveals no gallop.  No murmur heard. Bigeminy and trigeminy--but is regular  Respiratory: Effort normal and breath sounds normal. No respiratory distress. She has no wheezes. She has no rales.  GI: Soft. There is no abdominal tenderness.  Musculoskeletal:        General: No edema.  Lymphadenopathy:    She has no cervical adenopathy.    She has no axillary adenopathy.       Right: No inguinal adenopathy present.       Left: No inguinal adenopathy present.  Psychiatric:  Clearly depressed Appropriate affect No thought process issues           Assessment & Plan:

## 2020-03-30 NOTE — Assessment & Plan Note (Signed)
Still with anxiety and panic at times Xanax for prn

## 2020-03-30 NOTE — Assessment & Plan Note (Signed)
Has fatigue but likely from the depression  Will check labs anyway

## 2020-03-30 NOTE — Assessment & Plan Note (Signed)
Regular but frequent ectopy

## 2020-03-30 NOTE — Assessment & Plan Note (Signed)
Has had recurrent depressive spells since Mar 04, 2004 when son died Worse again after husband died and off and on since Has failed multiple med trials in all classes Will try trazodone for sleep Referral for counseling

## 2020-03-30 NOTE — Patient Instructions (Signed)
Please try the trazodone at bedtime. Start with 1 pill nightly and increase to 2 needed (after 1 week or so)

## 2020-04-10 ENCOUNTER — Ambulatory Visit (INDEPENDENT_AMBULATORY_CARE_PROVIDER_SITE_OTHER): Payer: Medicare Other | Admitting: *Deleted

## 2020-04-10 DIAGNOSIS — I495 Sick sinus syndrome: Secondary | ICD-10-CM

## 2020-04-10 LAB — CUP PACEART REMOTE DEVICE CHECK
Battery Remaining Longevity: 118 mo
Battery Voltage: 3.02 V
Brady Statistic AP VP Percent: 0.06 %
Brady Statistic AP VS Percent: 81.29 %
Brady Statistic AS VP Percent: 0.02 %
Brady Statistic AS VS Percent: 18.63 %
Brady Statistic RA Percent Paced: 91.21 %
Brady Statistic RV Percent Paced: 0.08 %
Date Time Interrogation Session: 20210615003402
Implantable Lead Implant Date: 20180808
Implantable Lead Implant Date: 20180808
Implantable Lead Location: 753859
Implantable Lead Location: 753860
Implantable Lead Model: 3830
Implantable Lead Model: 5076
Implantable Pulse Generator Implant Date: 20180808
Lead Channel Impedance Value: 266 Ohm
Lead Channel Impedance Value: 323 Ohm
Lead Channel Impedance Value: 399 Ohm
Lead Channel Impedance Value: 532 Ohm
Lead Channel Pacing Threshold Amplitude: 0.875 V
Lead Channel Pacing Threshold Amplitude: 0.875 V
Lead Channel Pacing Threshold Pulse Width: 0.4 ms
Lead Channel Pacing Threshold Pulse Width: 0.4 ms
Lead Channel Sensing Intrinsic Amplitude: 2.875 mV
Lead Channel Sensing Intrinsic Amplitude: 2.875 mV
Lead Channel Sensing Intrinsic Amplitude: 6.75 mV
Lead Channel Sensing Intrinsic Amplitude: 6.75 mV
Lead Channel Setting Pacing Amplitude: 2 V
Lead Channel Setting Pacing Amplitude: 2.5 V
Lead Channel Setting Pacing Pulse Width: 0.4 ms
Lead Channel Setting Sensing Sensitivity: 0.9 mV

## 2020-04-11 NOTE — Progress Notes (Signed)
Remote pacemaker transmission.   

## 2020-04-17 ENCOUNTER — Encounter: Payer: Self-pay | Admitting: Internal Medicine

## 2020-04-17 ENCOUNTER — Ambulatory Visit (INDEPENDENT_AMBULATORY_CARE_PROVIDER_SITE_OTHER): Payer: Medicare Other | Admitting: Internal Medicine

## 2020-04-17 ENCOUNTER — Other Ambulatory Visit: Payer: Self-pay

## 2020-04-17 VITALS — BP 144/82 | HR 73 | Ht 62.5 in | Wt 138.4 lb

## 2020-04-17 DIAGNOSIS — I495 Sick sinus syndrome: Secondary | ICD-10-CM | POA: Diagnosis not present

## 2020-04-17 DIAGNOSIS — Z950 Presence of cardiac pacemaker: Secondary | ICD-10-CM

## 2020-04-17 DIAGNOSIS — I483 Typical atrial flutter: Secondary | ICD-10-CM

## 2020-04-17 DIAGNOSIS — I10 Essential (primary) hypertension: Secondary | ICD-10-CM

## 2020-04-17 DIAGNOSIS — I480 Paroxysmal atrial fibrillation: Secondary | ICD-10-CM | POA: Diagnosis not present

## 2020-04-17 DIAGNOSIS — I493 Ventricular premature depolarization: Secondary | ICD-10-CM

## 2020-04-17 NOTE — Progress Notes (Signed)
Patient Care Team: Venia Carbon, MD as PCP - General (Internal Medicine)   HPI  Heidi Carroll is a 79 y.o. female Seen in follow-up for atrial flutter for which she underwent catheter ablation 4/18. She also has sinus node dysfunction and underwent pacing 8/18 Medtronic   Echocardiogram 1/18 EF normal 55-65%. Palpitations>> underwent Holter monitoring>>> PVC                       Non sustained atrial tachycardia  180 bpm but only 4 beats                       There is some suggestion of atrial tach, not fast about 110    On flecainide for her palpitations.. more recently has had some fluttering.   Minimal chest pain shortness of breath or edema.  Days after she does not sleep well and she has more problems   Recently started on trazodone for sleep.  Dr. Annamarie Major noted on examination more fluttering.  DATE PR interval QRSduration Dose-Flecainide  5/18  132 90 0  8/18 NA 88 50  11/18 Ap 238 98 50  12/19 Ap 220 88 50  6/21 Ap 63 86 50     Past Medical History:  Diagnosis Date   A-fib ([place])    Depression    Hypertension    Neuropathy    idiopathic   Presence of permanent cardiac pacemaker     Past Surgical History:  Procedure Laterality Date   A-FLUTTER ABLATION N/A 02/04/2017   Procedure: A-Flutter Ablation;  Surgeon: Deboraha Sprang, MD;  Location: [facility];  Service: Cardiovascular;  Laterality: N/A;   BUNIONECTOMY Bilateral 1995   bilateral    CATARACT EXTRACTION W/ INTRAOCULAR LENS  IMPLANT, BILATERAL Bilateral    INSERT / REPLACE / REMOVE PACEMAKER  06/03/2017   PACEMAKER IMPLANT N/A 06/03/2017   Procedure: Pacemaker Implant;  Surgeon: Deboraha Sprang, MD;  Location: [facility];  Service: Cardiovascular;  Laterality: N/A;   THROAT SURGERY  1980s   "nodules in my throat"   VAGINAL DELIVERY  1963   X 1   VAGINAL HYSTERECTOMY  1974    Current Outpatient Medications  Medication Sig Dispense Refill   acetaminophen  (TYLENOL) 500 MG tablet Take 500 mg by mouth daily as needed for moderate pain or headache.     albuterol (VENTOLIN HFA) 108 (90 Base) MCG/ACT inhaler INHALE 2 PUFFS BY MOUTH EVERY 6 HOURS AS NEEDED FOR WHEEZING FOR SHORTNESS OF BREATH 18 g 3   ALPRAZolam (XANAX) 0.25 MG tablet 1/2 to 1 by mouth twice a day as needed 60 tablet 0   aspirin EC 81 MG tablet Take 81 mg by mouth every evening.      Calcium Carbonate-Vitamin D (CALCIUM 600+D PO) Take 1 tablet by mouth every evening.      diltiazem (CARDIZEM CD) 120 MG 24 hr capsule Take 1 capsule by mouth once daily 90 capsule 1   flecainide (TAMBOCOR) 50 MG tablet Take 1 tablet (50 mg total) by mouth 2 (two) times daily. 180 tablet 2   gabapentin (NEURONTIN) 300 MG capsule Take 3-4 capsules (900-1,200 mg total) by mouth at bedtime. 120 capsule 11   hydrALAZINE (APRESOLINE) 25 MG tablet Take one tablet (25 mg) by mouth every 6 hours as needed for a blood pressure >170 on the top number 90 tablet 0   losartan (COZAAR) 50  MG tablet Take 1 tablet by mouth once daily 90 tablet 1   ondansetron (ZOFRAN) 4 MG tablet Take 1 tablet (4 mg total) by mouth every 8 (eight) hours as needed for nausea or vomiting. 20 tablet 0   traZODone (DESYREL) 50 MG tablet Take 1-2 tablets (50-100 mg total) by mouth at bedtime. 60 tablet 11   No current facility-administered medications for this visit.    Allergies  Allergen Reactions   Cymbalta [Duloxetine Hcl] Nausea Only   Wellbutrin [Bupropion] Other (See Comments)    Elevated blood pressure, anxiety   Zoloft [Sertraline Hcl] Nausea Only    Severe weight loss    Nicotine Palpitations    Nicotine Patches    Penicillins Rash    Has patient had a PCN reaction causing immediate rash, facial/tongue/throat swelling, SOB or lightheadedness with hypotension yes Has patient had a PCN reaction causing severe rash involving mucus membranes or skin necrosis: no Has patient had a PCN reaction that required  hospitalization no Has patient had a PCN reaction occurring within the last 10 years: no If all of the above answers are "NO", then may proceed with Cephalosporin use.      Review of Systems negative except from HPI and PMH  Physical Exam BP (!) 144/82 (BP Location: Left Arm, Patient Position: Sitting, Cuff Size: Normal)    Pulse 73    Ht 5' 2.5" (1.588 m)    Wt 138 lb 6 oz (62.8 kg)    SpO2 92%    BMI 24.91 kg/m  Well developed and well nourished in no acute distress HENT normal Neck supple with JVP-flat Clear Device pocket well healed; without hematoma or erythema.  There is no tethering  Regular rate and rhythm, no  gallop No murmur Abd-soft with active BS No Clubbing cyanosis  edema Skin-warm and dry A & Oriented  Grossly normal sensory and motor function  ECG atrial pacing with frequent PVCs-RVOT  Assessment and  Plan  Atrial flutter status post ablation  AFib paroxysmal/SCAF   Palpitations with PVCs and PACs  Hypertension  Anxiety  /depression  Pacemaker Medtronic The patient's device was interrogated.  The information was reviewed. No changes were made in the programming.   No intercurrent atrial fibrillation-sustained  Increasing PVCs.  Noted on histograms; unfortunately not quantitated.  Will follow.  Continue flecainide at current dosing.  A little bit reluctant to increase it given the prolongation noted on her PR interval  Blood pressure is reasonably controlled she reminds me of her 79 year old Heidi Carroll  No anticoagulation given the episodes duration of less than 24 hours

## 2020-04-17 NOTE — Patient Instructions (Signed)

## 2020-04-24 ENCOUNTER — Ambulatory Visit (INDEPENDENT_AMBULATORY_CARE_PROVIDER_SITE_OTHER): Payer: Medicare Other | Admitting: Psychology

## 2020-04-24 DIAGNOSIS — F3289 Other specified depressive episodes: Secondary | ICD-10-CM

## 2020-04-30 ENCOUNTER — Ambulatory Visit (INDEPENDENT_AMBULATORY_CARE_PROVIDER_SITE_OTHER): Payer: Medicare Other | Admitting: Psychology

## 2020-04-30 DIAGNOSIS — F3289 Other specified depressive episodes: Secondary | ICD-10-CM

## 2020-05-02 MED ORDER — SERTRALINE HCL 50 MG PO TABS
50.0000 mg | ORAL_TABLET | Freq: Every day | ORAL | 3 refills | Status: DC
Start: 2020-05-02 — End: 2020-08-22

## 2020-05-02 NOTE — Telephone Encounter (Signed)
Please set her up for appt in 3-4 weeks to see how she is doing on the new medication

## 2020-05-08 ENCOUNTER — Other Ambulatory Visit: Payer: Self-pay | Admitting: Internal Medicine

## 2020-05-10 ENCOUNTER — Other Ambulatory Visit: Payer: Self-pay

## 2020-05-10 ENCOUNTER — Other Ambulatory Visit
Admission: RE | Admit: 2020-05-10 | Discharge: 2020-05-10 | Disposition: A | Discharge status: Home / Self Care | Payer: Medicare Other | Attending: Internal Medicine | Admitting: Internal Medicine

## 2020-05-10 DIAGNOSIS — I700 Atherosclerosis of aorta: Secondary | ICD-10-CM | POA: Insufficient documentation

## 2020-05-10 LAB — LIPID PANEL
Cholesterol: 191 mg/dL (ref 0–200)
HDL: 66 mg/dL (ref >40)
LDL Cholesterol: 110 mg/dL — ABNORMAL HIGH (ref 0–99)
Total CHOL/HDL Ratio: 2.9 RATIO
Triglycerides: 73 mg/dL (ref <150)
VLDL: 15 mg/dL (ref 0–40)

## 2020-05-14 ENCOUNTER — Ambulatory Visit (INDEPENDENT_AMBULATORY_CARE_PROVIDER_SITE_OTHER): Payer: Medicare Other | Admitting: Psychology

## 2020-05-14 DIAGNOSIS — F329 Major depressive disorder, single episode, unspecified: Secondary | ICD-10-CM

## 2020-05-28 ENCOUNTER — Ambulatory Visit (INDEPENDENT_AMBULATORY_CARE_PROVIDER_SITE_OTHER): Payer: Medicare Other | Admitting: Psychology

## 2020-05-28 DIAGNOSIS — F3289 Other specified depressive episodes: Secondary | ICD-10-CM

## 2020-06-13 ENCOUNTER — Ambulatory Visit (INDEPENDENT_AMBULATORY_CARE_PROVIDER_SITE_OTHER): Payer: Medicare Other | Admitting: Psychology

## 2020-06-13 DIAGNOSIS — F3289 Other specified depressive episodes: Secondary | ICD-10-CM

## 2020-06-22 ENCOUNTER — Other Ambulatory Visit: Payer: Self-pay | Admitting: Internal Medicine

## 2020-06-27 ENCOUNTER — Ambulatory Visit (INDEPENDENT_AMBULATORY_CARE_PROVIDER_SITE_OTHER): Payer: Medicare Other | Admitting: Psychology

## 2020-06-27 DIAGNOSIS — F3289 Other specified depressive episodes: Secondary | ICD-10-CM | POA: Diagnosis not present

## 2020-07-10 ENCOUNTER — Ambulatory Visit (INDEPENDENT_AMBULATORY_CARE_PROVIDER_SITE_OTHER): Payer: Medicare Other | Admitting: *Deleted

## 2020-07-10 DIAGNOSIS — I495 Sick sinus syndrome: Secondary | ICD-10-CM

## 2020-07-10 LAB — CUP PACEART REMOTE DEVICE CHECK
Battery Remaining Longevity: 116 mo
Battery Voltage: 3.02 V
Brady Statistic AP VP Percent: 0.06 %
Brady Statistic AP VS Percent: 83.39 %
Brady Statistic AS VP Percent: 0.02 %
Brady Statistic AS VS Percent: 16.53 %
Brady Statistic RA Percent Paced: 90.86 %
Brady Statistic RV Percent Paced: 0.08 %
Date Time Interrogation Session: 20210914003555
Implantable Lead Implant Date: 20180808
Implantable Lead Implant Date: 20180808
Implantable Lead Location: 753859
Implantable Lead Location: 753860
Implantable Lead Model: 3830
Implantable Lead Model: 5076
Implantable Pulse Generator Implant Date: 20180808
Lead Channel Impedance Value: 285 Ohm
Lead Channel Impedance Value: 342 Ohm
Lead Channel Impedance Value: 418 Ohm
Lead Channel Impedance Value: 551 Ohm
Lead Channel Pacing Threshold Amplitude: 0.875 V
Lead Channel Pacing Threshold Amplitude: 0.875 V
Lead Channel Pacing Threshold Pulse Width: 0.4 ms
Lead Channel Pacing Threshold Pulse Width: 0.4 ms
Lead Channel Sensing Intrinsic Amplitude: 3.125 mV
Lead Channel Sensing Intrinsic Amplitude: 3.125 mV
Lead Channel Sensing Intrinsic Amplitude: 6.75 mV
Lead Channel Sensing Intrinsic Amplitude: 6.75 mV
Lead Channel Setting Pacing Amplitude: 2 V
Lead Channel Setting Pacing Amplitude: 2.5 V
Lead Channel Setting Pacing Pulse Width: 0.4 ms
Lead Channel Setting Sensing Sensitivity: 0.9 mV

## 2020-07-11 ENCOUNTER — Ambulatory Visit (INDEPENDENT_AMBULATORY_CARE_PROVIDER_SITE_OTHER): Payer: Medicare Other | Admitting: Psychology

## 2020-07-11 DIAGNOSIS — F3289 Other specified depressive episodes: Secondary | ICD-10-CM

## 2020-07-12 NOTE — Progress Notes (Signed)
Remote pacemaker transmission.   

## 2020-07-25 ENCOUNTER — Ambulatory Visit (INDEPENDENT_AMBULATORY_CARE_PROVIDER_SITE_OTHER): Payer: Medicare Other | Admitting: Psychology

## 2020-07-25 DIAGNOSIS — F3289 Other specified depressive episodes: Secondary | ICD-10-CM

## 2020-08-08 ENCOUNTER — Ambulatory Visit (INDEPENDENT_AMBULATORY_CARE_PROVIDER_SITE_OTHER): Payer: Medicare Other | Admitting: Psychology

## 2020-08-08 DIAGNOSIS — F3289 Other specified depressive episodes: Secondary | ICD-10-CM

## 2020-08-22 ENCOUNTER — Ambulatory Visit (INDEPENDENT_AMBULATORY_CARE_PROVIDER_SITE_OTHER): Payer: Medicare Other | Admitting: Psychology

## 2020-08-22 ENCOUNTER — Other Ambulatory Visit: Payer: Self-pay | Admitting: Internal Medicine

## 2020-08-22 DIAGNOSIS — F3289 Other specified depressive episodes: Secondary | ICD-10-CM

## 2020-09-05 ENCOUNTER — Ambulatory Visit (INDEPENDENT_AMBULATORY_CARE_PROVIDER_SITE_OTHER): Payer: Medicare Other | Admitting: Psychology

## 2020-09-05 DIAGNOSIS — F3289 Other specified depressive episodes: Secondary | ICD-10-CM | POA: Diagnosis not present

## 2020-09-10 ENCOUNTER — Ambulatory Visit (INDEPENDENT_AMBULATORY_CARE_PROVIDER_SITE_OTHER): Payer: Medicare Other | Admitting: Internal Medicine

## 2020-09-10 ENCOUNTER — Other Ambulatory Visit: Payer: Self-pay

## 2020-09-10 ENCOUNTER — Encounter: Payer: Self-pay | Admitting: Internal Medicine

## 2020-09-10 VITALS — BP 126/80 | HR 76 | Temp 97.4°F | Ht 62.5 in | Wt 126.0 lb

## 2020-09-10 DIAGNOSIS — F330 Major depressive disorder, recurrent, mild: Secondary | ICD-10-CM | POA: Diagnosis not present

## 2020-09-10 DIAGNOSIS — Z23 Encounter for immunization: Secondary | ICD-10-CM

## 2020-09-10 DIAGNOSIS — I480 Paroxysmal atrial fibrillation: Secondary | ICD-10-CM | POA: Diagnosis not present

## 2020-09-10 DIAGNOSIS — G609 Hereditary and idiopathic neuropathy, unspecified: Secondary | ICD-10-CM

## 2020-09-10 DIAGNOSIS — F17200 Nicotine dependence, unspecified, uncomplicated: Secondary | ICD-10-CM

## 2020-09-10 DIAGNOSIS — E441 Mild protein-calorie malnutrition: Secondary | ICD-10-CM | POA: Insufficient documentation

## 2020-09-10 DIAGNOSIS — Z0000 Encounter for general adult medical examination without abnormal findings: Secondary | ICD-10-CM | POA: Diagnosis not present

## 2020-09-10 DIAGNOSIS — J449 Chronic obstructive pulmonary disease, unspecified: Secondary | ICD-10-CM

## 2020-09-10 DIAGNOSIS — Z7189 Other specified counseling: Secondary | ICD-10-CM

## 2020-09-10 MED ORDER — ALBUTEROL SULFATE HFA 108 (90 BASE) MCG/ACT IN AERS
INHALATION_SPRAY | RESPIRATORY_TRACT | 2 refills | Status: DC
Start: 2020-09-10 — End: 2021-03-22

## 2020-09-10 NOTE — Assessment & Plan Note (Signed)
Doing okay with bedtime gabapentin

## 2020-09-10 NOTE — Assessment & Plan Note (Signed)
Partial remission On the sertraline --will switch to AM Trazodone for sleep Rare xanax

## 2020-09-10 NOTE — Addendum Note (Signed)
Addended by: Eual Fines on: 09/10/2020 03:24 PM   Modules accepted: Orders

## 2020-09-10 NOTE — Progress Notes (Signed)
Subjective:    Patient ID: Heidi Carroll, female    DOB: [birth year], 79 y.o.   MRN: [id number]  HPI Here for Medicare wellness visit and follow up of chronic health conditions This visit occurred during the SARS-CoV-2 public health emergency.  Safety protocols were in place, including screening questions prior to the visit, additional usage of staff PPE, and extensive cleaning of exam room while observing appropriate contact time as indicated for disinfecting solutions.   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 This is okay Still on trazodone Hasn't needed the xanax in quite some time  Had upper plate shaved down--now able to wear without gagging Full plates Appetite still not very good Has lost 15# in the past year  No apparent heart trouble Rare palpitations No chest pain   Gets tightness in body---before moving her bowels Better after she goes  Breathing has been okay No limitations now No regular cough  Still on gabapentin for for neuropathy Bedtime gabapentin still helpful  Current Outpatient Medications on File Prior to Visit  Medication Sig Dispense Refill  . acetaminophen (TYLENOL) 500 MG tablet Take 500 mg by mouth daily as needed for moderate pain or headache.    . albuterol (VENTOLIN HFA) 108 (90 Base) MCG/ACT inhaler INHALE 2 PUFFS BY MOUTH EVERY 6 HOURS AS NEEDED FOR  WHEEZING OR SHORTNESS OF BREATH 18 g 0  . ALPRAZolam (XANAX) 0.25 MG tablet 1/2 to 1 by mouth twice a day as needed 60 tablet 0  . aspirin EC 81 MG tablet Take 81 mg by mouth every evening.     . Calcium Carbonate-Vitamin D (CALCIUM 600+D PO) Take 1 tablet by mouth every evening.     . diltiazem (CARDIZEM CD) 120 MG 24 hr capsule Take 1 capsule by mouth once daily 90 capsule 1  . flecainide (TAMBOCOR) 50 MG tablet Take 1 tablet (50 mg total) by mouth 2 (two) times daily. 180 tablet 2  . gabapentin (NEURONTIN) 300 MG capsule Take 3-4 capsules (900-1,200 mg total) by mouth at bedtime. 120 capsule 11  . hydrALAZINE (APRESOLINE) 25 MG tablet Take one tablet (25 mg) by mouth every 6 hours as needed for a blood pressure >170 on the top number 90 tablet 0  . losartan (COZAAR) 50 MG tablet Take 1 tablet by mouth once daily 90 tablet 1  . ondansetron (ZOFRAN) 4 MG tablet Take 1 tablet (4 mg total) by mouth every 8 (eight) hours as needed for nausea or vomiting. 20 tablet 0  . sertraline (ZOLOFT) 50 MG tablet Take 1 tablet by mouth once daily 30 tablet 11  . traZODone (DESYREL) 50 MG tablet Take 1-2 tablets (50-100 mg total) by mouth at bedtime. 60 tablet 11   No current facility-administered medications on file prior to visit.    Allergies  Allergen Reactions  . Cymbalta [Duloxetine Hcl] Nausea Only  . Wellbutrin [Bupropion] Other (See Comments)    Elevated blood pressure,  anxiety  . Zoloft [Sertraline Hcl] Nausea Only    Severe weight loss   . Nicotine Palpitations    Nicotine Patches   . Penicillins Rash    Has patient had a PCN reaction causing immediate rash, facial/tongue/throat swelling, SOB or lightheadedness with hypotension yes Has patient had a PCN reaction causing severe rash involving mucus membranes or skin necrosis: no Has patient had a PCN reaction that required hospitalization no Has patient had a PCN reaction occurring within the last 10 years: no If all of the above answers  are "NO", then may proceed with Cephalosporin use.    Past Medical History:  Diagnosis Date  . A-fib ([facility])   . Depression   . Hypertension   . Neuropathy    idiopathic  . Presence of permanent cardiac pacemaker     Past Surgical History:  Procedure Laterality Date  . A-FLUTTER ABLATION N/A 02/04/2017   Procedure: A-Flutter Ablation;  Surgeon: Duke Salvia, MD;  Location: [facility];  Service: Cardiovascular;  Laterality: N/A;  . BUNIONECTOMY Bilateral 1995   bilateral   . CATARACT EXTRACTION W/ INTRAOCULAR LENS  IMPLANT, BILATERAL Bilateral   . INSERT / REPLACE / REMOVE PACEMAKER  06/03/2017  . PACEMAKER IMPLANT N/A 06/03/2017   Procedure: Pacemaker Implant;  Surgeon: Duke Salvia, MD;  Location: [facility];  Service: Cardiovascular;  Laterality: N/A;  . THROAT SURGERY  1980s   "nodules in my throat"  . VAGINAL DELIVERY  1963   X 1  . VAGINAL HYSTERECTOMY  1974    Family History  Problem Relation Age of Onset  . Cancer Father        colon  . Heart attack Brother   . Diabetes Maternal Grandfather     Social History   Socioeconomic History  . Marital status: Widowed    Spouse name: Not on file  . Number of children: 1  . Years of education: Not on file  . Highest education level: Not on file  Occupational History  . Occupation: Retired, [employer] Counselling psychologist)  Tobacco Use  . Smoking status: Current Every Day Smoker    Packs/day: 1.00    Years: 60.00    Pack years: 60.00    Types: Cigarettes  . Smokeless tobacco: Never Used  Vaping Use  . Vaping Use: Never used  Substance and Sexual Activity  . Alcohol use: No  . Drug use: No  . Sexual activity: Never  Other Topics Concern  . Not on file  Social History Narrative   Husband died 08-27-2023      Has living will   Has health care POA--- Chaya Jan, friend   Requests DNR--- form done 12/22/14   No tube feedings if cognitively unaware   Social Determinants of Health   Financial Resource  Strain:   . Difficulty of Paying Living Expenses: Not on file  Food Insecurity:   . Worried About Programme researcher, broadcasting/film/video in the Last Year: Not on file  . Ran Out of Food in the Last Year: Not on file  Transportation Needs:   . Lack of Transportation (Medical): Not on file  . Lack of Transportation (Non-Medical): Not on file  Physical Activity:   . Days of Exercise per Week: Not on file  . Minutes of Exercise per Session: Not on file  Stress:   . Feeling of Stress : Not on file  Social Connections:   . Frequency of Communication with Friends and Family: Not  on file  . Frequency of Social Gatherings with Friends and Family: Not on file  . Attends Religious Services: Not on file  . Active Member of Clubs or Organizations: Not on file  . Attends Banker Meetings: Not on file  . Marital Status: Not on file  Intimate Partner Violence:   . Fear of Current or Ex-Partner: Not on file  . Emotionally Abused: Not on file  . Physically Abused: Not on file  . Sexually Abused: Not on file   Review of Systems No skin lesions--doesn't see derm No heartburn or dysphagia Bowels okay with miralax every other day---no blood No sig back or joint pains Wears seat belt     Objective:   Physical Exam Constitutional:      Appearance: Normal appearance.  HENT:     Mouth/Throat:     Comments: No lesions Eyes:     Conjunctiva/sclera: Conjunctivae normal.     Pupils: Pupils are equal, round, and reactive to light.  Cardiovascular:     Rate and Rhythm: Normal rate and regular rhythm.     Heart sounds: No murmur heard.  No gallop.      Comments: Feel cool Pulses not palpable Pulmonary:     Effort: Pulmonary effort is normal.     Breath sounds: No wheezing or rales.     Comments: Decreased breath sounds but clear Abdominal:     Palpations: Abdomen is soft.     Tenderness: There is no abdominal tenderness.  Musculoskeletal:     Cervical back: Neck supple.     Right lower leg: No  edema.     Left lower leg: No edema.  Lymphadenopathy:     Cervical: No cervical adenopathy.  Skin:    General: Skin is warm.     Findings: No rash.  Neurological:     Mental Status: She is alert and oriented to person, place, and time.     Comments: President--- "Liliane Channel, Obama" [id number] Recall 3/3  Psychiatric:        Mood and Affect: Mood normal.        Behavior: Behavior normal.            Assessment & Plan:

## 2020-09-10 NOTE — Assessment & Plan Note (Signed)
Still smoking Very little air movement but not tight Uses the albuterol inhaler mostly in hot weather

## 2020-09-10 NOTE — Assessment & Plan Note (Signed)
I have personally reviewed the Medicare Annual Wellness questionnaire and have noted 1. The patient's medical and social history 2. Their use of alcohol, tobacco or illicit drugs 3. Their current medications and supplements 4. The patient's functional ability including ADL's, fall risks, home safety risks and hearing or visual             impairment. 5. Diet and physical activities 6. Evidence for depression or mood disorders  The patients weight, height, BMI and visual acuity have been recorded in the chart I have made referrals, counseling and provided education to the patient based review of the above and I have provided the pt with a written personalized care plan for preventive services.  I have provided you with a copy of your personalized plan for preventive services. Please take the time to review along with your updated medication list.  Had COVID booster Flu vaccine today Discussed exercise Done with cancer screening--we discussed  She will get the shingrix at the pharmacy

## 2020-09-10 NOTE — Assessment & Plan Note (Signed)
Regular now on flecainide and diltiazem ASA only

## 2020-09-10 NOTE — Assessment & Plan Note (Signed)
Just not ready to stop Discussed decreasing if possible

## 2020-09-10 NOTE — Progress Notes (Signed)
Hearing Screening   Method: Audiometry   125Hz  250Hz  500Hz  1000Hz  2000Hz  3000Hz  4000Hz  6000Hz  8000Hz   Right ear:   20 20 20  20     Left ear:   20 20 20  20       Visual Acuity Screening   Right eye Left eye Both eyes  Without correction:     With correction: 20/25 20/30 20/20

## 2020-09-10 NOTE — Assessment & Plan Note (Signed)
Has lost 15#---but still feels she is over her usual weight (from decades ago though). Discussed adding boost/ensure for skipped meals and monitoring

## 2020-09-10 NOTE — Assessment & Plan Note (Signed)
See social history °Has DNR °

## 2020-09-18 ENCOUNTER — Ambulatory Visit (INDEPENDENT_AMBULATORY_CARE_PROVIDER_SITE_OTHER): Payer: Medicare Other | Admitting: Psychology

## 2020-09-18 DIAGNOSIS — F3289 Other specified depressive episodes: Secondary | ICD-10-CM | POA: Diagnosis not present

## 2020-09-24 ENCOUNTER — Other Ambulatory Visit: Payer: Self-pay | Admitting: Internal Medicine

## 2020-10-03 ENCOUNTER — Ambulatory Visit (INDEPENDENT_AMBULATORY_CARE_PROVIDER_SITE_OTHER): Payer: Medicare Other | Admitting: Psychology

## 2020-10-03 DIAGNOSIS — F3289 Other specified depressive episodes: Secondary | ICD-10-CM

## 2020-10-10 LAB — CUP PACEART REMOTE DEVICE CHECK
Battery Remaining Longevity: 114 mo
Battery Voltage: 3.01 V
Brady Statistic AP VP Percent: 0.07 %
Brady Statistic AP VS Percent: 85.13 %
Brady Statistic AS VP Percent: 0.02 %
Brady Statistic AS VS Percent: 14.78 %
Brady Statistic RA Percent Paced: 91.63 %
Brady Statistic RV Percent Paced: 0.1 %
Date Time Interrogation Session: 20211213233612
Implantable Lead Implant Date: 20180808
Implantable Lead Implant Date: 20180808
Implantable Lead Location: 753859
Implantable Lead Location: 753860
Implantable Lead Model: 3830
Implantable Lead Model: 5076
Implantable Pulse Generator Implant Date: 20180808
Lead Channel Impedance Value: 304 Ohm
Lead Channel Impedance Value: 361 Ohm
Lead Channel Impedance Value: 399 Ohm
Lead Channel Impedance Value: 532 Ohm
Lead Channel Pacing Threshold Amplitude: 0.875 V
Lead Channel Pacing Threshold Amplitude: 0.875 V
Lead Channel Pacing Threshold Pulse Width: 0.4 ms
Lead Channel Pacing Threshold Pulse Width: 0.4 ms
Lead Channel Sensing Intrinsic Amplitude: 2.875 mV
Lead Channel Sensing Intrinsic Amplitude: 2.875 mV
Lead Channel Sensing Intrinsic Amplitude: 8.5 mV
Lead Channel Sensing Intrinsic Amplitude: 8.5 mV
Lead Channel Setting Pacing Amplitude: 2 V
Lead Channel Setting Pacing Amplitude: 2.5 V
Lead Channel Setting Pacing Pulse Width: 0.4 ms
Lead Channel Setting Sensing Sensitivity: 0.9 mV

## 2020-10-11 ENCOUNTER — Ambulatory Visit (INDEPENDENT_AMBULATORY_CARE_PROVIDER_SITE_OTHER): Payer: Medicare Other

## 2020-10-11 DIAGNOSIS — I495 Sick sinus syndrome: Secondary | ICD-10-CM

## 2020-10-16 ENCOUNTER — Ambulatory Visit (INDEPENDENT_AMBULATORY_CARE_PROVIDER_SITE_OTHER): Payer: Medicare Other | Admitting: Internal Medicine

## 2020-10-16 ENCOUNTER — Other Ambulatory Visit: Payer: Self-pay

## 2020-10-16 ENCOUNTER — Ambulatory Visit: Payer: Medicare Other | Admitting: Psychology

## 2020-10-16 ENCOUNTER — Encounter: Payer: Self-pay | Admitting: Internal Medicine

## 2020-10-16 VITALS — BP 144/72 | HR 74 | Ht 62.5 in | Wt 123.2 lb

## 2020-10-16 DIAGNOSIS — I495 Sick sinus syndrome: Secondary | ICD-10-CM

## 2020-10-16 DIAGNOSIS — Z950 Presence of cardiac pacemaker: Secondary | ICD-10-CM

## 2020-10-16 DIAGNOSIS — I10 Essential (primary) hypertension: Secondary | ICD-10-CM

## 2020-10-16 DIAGNOSIS — I480 Paroxysmal atrial fibrillation: Secondary | ICD-10-CM

## 2020-10-16 LAB — PACEMAKER DEVICE OBSERVATION

## 2020-10-16 NOTE — Patient Instructions (Signed)

## 2020-10-16 NOTE — Progress Notes (Signed)
Patient Care Team: Karie Schwalbe, MD as PCP - General (Internal Medicine)   HPI  Heidi Carroll is a 79 y.o. female Seen in follow-up for atrial flutter for which she underwent catheter ablation 4/18. She also has sinus node dysfunction and underwent pacing 8/18 [employer]tr[employer]   Echocardiogram 1/18 EF normal 55-65%. Palpitations>> underwent Holter monitoring>>> PVC                       Non sustained atrial tachycardia  180 bpm but only 4 beats                       There is some suggestion of atrial tach, not fast about 110   Flecainide for atrial tach and PVCs No palpitations.  No chest pain or shortness of breath.  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  She reminds me that she hates the cold.    DATE PR interval QRSduration Dose-Flecainide  5/18  132 90 0  8/18 NA 88 50  11/18 Ap 238 98 50  12/19 Ap 220 88 50  6/21 Ap 232 86 50  12/21        Past Medical History:  Diagnosis Date  . A-fib ([facility])   . Depression   . Hypertension   . Neuropathy    idiopathic  . Presence of permanent cardiac pacemaker     Past Surgical History:  Procedure Laterality Date  . A-FLUTTER ABLATION N/A 02/04/2017   Procedure: A-Flutter Ablation;  Surgeon: Duke Salvia, MD;  Location: [facility];  Service: Cardiovascular;  Laterality: N/A;  . BUNIONECTOMY Bilateral 1995   bilateral   . CATARACT EXTRACTION W/ INTRAOCULAR LENS  IMPLANT, BILATERAL Bilateral   . INSERT / REPLACE / REMOVE PACEMAKER  06/03/2017  . PACEMAKER IMPLANT N/A 06/03/2017   Procedure: Pacemaker Implant;  Surgeon: Duke Salvia, MD;  Location: [facility];  Service: Cardiovascular;  Laterality: N/A;  . THROAT SURGERY  1980s   "nodules in my throat"  . VAGINAL DELIVERY  1963   X 1  . VAGINAL HYSTERECTOMY  1974    Current Outpatient Medications  Medication Sig Dispense Refill  . acetaminophen (TYLENOL) 500 MG tablet Take 500 mg by mouth daily as needed for  moderate pain or headache.    . albuterol (VENTOLIN HFA) 108 (90 Base) MCG/ACT inhaler INHALE 2 PUFFS BY MOUTH EVERY 6 HOURS AS NEEDED FOR WHEEZING OR SHORTNESS OF BREATH 18 g 2  . ALPRAZolam (XANAX) 0.25 MG tablet 1/2 to 1 by mouth twice a day as needed 60 tablet 0  . aspirin EC 81 MG tablet Take 81 mg by mouth every evening.     . Calcium Carbonate-Vitamin D (CALCIUM 600+D PO) Take 1 tablet by mouth every evening.     . diltiazem (CARDIZEM CD) 120 MG 24 hr capsule Take 1 capsule by mouth once daily 90 capsule 1  . flecainide (TAMBOCOR) 50 MG tablet Take 1 tablet (50 mg total) by mouth 2 (two) times daily. 180 tablet 2  . gabapentin (NEURONTIN) 300 MG capsule Take 3-4 capsules (900-1,200 mg total) by mouth at bedtime. 120 capsule 11  . hydrALAZINE (APRESOLINE) 25 MG tablet Take one tablet (25 mg) by mouth every 6 hours as needed for a blood pressure >170 on the top number 90 tablet 0  . losartan (COZAAR) 50 MG tablet  Take 1 tablet by mouth once daily 90 tablet 3  . ondansetron (ZOFRAN) 4 MG tablet Take 1 tablet (4 mg total) by mouth every 8 (eight) hours as needed for nausea or vomiting. 20 tablet 0  . sertraline (ZOLOFT) 50 MG tablet Take 1 tablet by mouth once daily 30 tablet 11  . traZODone (DESYREL) 50 MG tablet Take 1-2 tablets (50-100 mg total) by mouth at bedtime. 60 tablet 11   No current facility-administered medications for this visit.    Allergies  Allergen Reactions  . Cymbalta [Duloxetine Hcl] Nausea Only  . Wellbutrin [Bupropion] Other (See Comments)    Elevated blood pressure, anxiety  . Nicotine Palpitations    Nicotine Patches   . Penicillins Rash    Has patient had a PCN reaction causing immediate rash, facial/tongue/throat swelling, SOB or lightheadedness with hypotension yes Has patient had a PCN reaction causing severe rash involving mucus membranes or skin necrosis: no Has patient had a PCN reaction that required hospitalization no Has patient had a PCN reaction  occurring within the last 10 years: no If all of the above answers are "NO", then may proceed with Cephalosporin use.      Review of Systems negative except from HPI and PMH  Physical Exam BP (!) 144/72 (BP Location: Left Arm, Patient Position: Sitting, Cuff Size: Normal)   Pulse 74   Ht 5' 2.5" (1.588 m)   Wt 123 lb 4 oz (55.9 kg)   SpO2 96%   BMI 22.18 kg/m  Well developed and well nourished in no acute distress HENT normal Neck supple with JVP-flat Clear Device pocket well healed; without hematoma or erythema.  There is no tethering  Regular rate and rhythm, no murmur Abd-soft with active BS No Clubbing cyanosis   edema Skin-warm and dry A & Oriented  Grossly normal sensory and motor function  ECG atrial paced at 74 Interval 22/09/42 PVCs left bundle inferior axis the pattern of trigeminy-RVOT  Assessment and  Plan  Atrial flutter status post ablation  AFib paroxysmal/SCAF   Palpitations with PVCs and PACs  Hypertension  Anxiety  /depression  Pacemaker Medtronic The patient's device was interrogated.  The information was reviewed. No changes were made in the programming.      No intercurrent atrial fibrillation.  No bleeding.  PVC count based on histograms appears to be about 5 to 10%; continue flecainide.  Blood pressure is better.  Anxiety and depression seems less debilitating.  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

## 2020-10-17 LAB — CUP PACEART INCLINIC DEVICE CHECK
Battery Remaining Longevity: 115 mo
Battery Voltage: 3.01 V
Brady Statistic AP VP Percent: 0.07 %
Brady Statistic AP VS Percent: 84.38 %
Brady Statistic AS VP Percent: 0.02 %
Brady Statistic AS VS Percent: 15.54 %
Brady Statistic RA Percent Paced: 91.3 %
Brady Statistic RV Percent Paced: 0.09 %
Date Time Interrogation Session: 20211221114500
Implantable Lead Implant Date: 20180808
Implantable Lead Implant Date: 20180808
Implantable Lead Location: 753859
Implantable Lead Location: 753860
Implantable Lead Model: 3830
Implantable Lead Model: 5076
Implantable Pulse Generator Implant Date: 20180808
Lead Channel Impedance Value: 304 Ohm
Lead Channel Impedance Value: 380 Ohm
Lead Channel Impedance Value: 418 Ohm
Lead Channel Impedance Value: 570 Ohm
Lead Channel Pacing Threshold Amplitude: 0.75 V
Lead Channel Pacing Threshold Amplitude: 0.75 V
Lead Channel Pacing Threshold Pulse Width: 0.4 ms
Lead Channel Pacing Threshold Pulse Width: 0.4 ms
Lead Channel Sensing Intrinsic Amplitude: 10.375 mV
Lead Channel Setting Pacing Amplitude: 2 V
Lead Channel Setting Pacing Amplitude: 2.5 V
Lead Channel Setting Pacing Pulse Width: 0.4 ms
Lead Channel Setting Sensing Sensitivity: 0.9 mV

## 2020-10-25 NOTE — Progress Notes (Signed)
Remote pacemaker transmission.   

## 2020-10-30 ENCOUNTER — Ambulatory Visit (INDEPENDENT_AMBULATORY_CARE_PROVIDER_SITE_OTHER): Payer: Medicare Other | Admitting: Psychology

## 2020-10-30 DIAGNOSIS — F3289 Other specified depressive episodes: Secondary | ICD-10-CM | POA: Diagnosis not present

## 2020-11-03 ENCOUNTER — Other Ambulatory Visit: Payer: Self-pay | Admitting: Internal Medicine

## 2020-11-13 ENCOUNTER — Ambulatory Visit (INDEPENDENT_AMBULATORY_CARE_PROVIDER_SITE_OTHER): Payer: Medicare Other | Admitting: Psychology

## 2020-11-13 DIAGNOSIS — F3289 Other specified depressive episodes: Secondary | ICD-10-CM | POA: Diagnosis not present

## 2020-11-21 ENCOUNTER — Other Ambulatory Visit: Payer: Self-pay | Admitting: Internal Medicine

## 2020-11-21 NOTE — Telephone Encounter (Signed)
Rx request sent to pharmacy.  

## 2020-11-30 ENCOUNTER — Ambulatory Visit (INDEPENDENT_AMBULATORY_CARE_PROVIDER_SITE_OTHER): Payer: Medicare Other | Admitting: Psychology

## 2020-11-30 DIAGNOSIS — F3289 Other specified depressive episodes: Secondary | ICD-10-CM

## 2020-12-17 ENCOUNTER — Ambulatory Visit (INDEPENDENT_AMBULATORY_CARE_PROVIDER_SITE_OTHER): Payer: Medicare Other | Admitting: Psychology

## 2020-12-17 DIAGNOSIS — F3289 Other specified depressive episodes: Secondary | ICD-10-CM

## 2020-12-31 ENCOUNTER — Ambulatory Visit (INDEPENDENT_AMBULATORY_CARE_PROVIDER_SITE_OTHER): Payer: Medicare Other | Admitting: Psychology

## 2020-12-31 DIAGNOSIS — F3289 Other specified depressive episodes: Secondary | ICD-10-CM

## 2021-01-10 ENCOUNTER — Ambulatory Visit (INDEPENDENT_AMBULATORY_CARE_PROVIDER_SITE_OTHER): Payer: Medicare Other

## 2021-01-10 DIAGNOSIS — I495 Sick sinus syndrome: Secondary | ICD-10-CM

## 2021-01-10 LAB — CUP PACEART REMOTE DEVICE CHECK
Battery Remaining Longevity: 110 mo
Battery Remaining Longevity: 110 mo
Battery Voltage: 3.01 V
Battery Voltage: 3.01 V
Brady Statistic AP VP Percent: 0.08 %
Brady Statistic AP VP Percent: 0.09 %
Brady Statistic AP VS Percent: 83.96 %
Brady Statistic AP VS Percent: 84.66 %
Brady Statistic AS VP Percent: 0.02 %
Brady Statistic AS VP Percent: 0.02 %
Brady Statistic AS VS Percent: 15.25 %
Brady Statistic AS VS Percent: 15.94 %
Brady Statistic RA Percent Paced: 89.92 %
Brady Statistic RA Percent Paced: 91.24 %
Brady Statistic RV Percent Paced: 0.1 %
Brady Statistic RV Percent Paced: 0.11 %
Date Time Interrogation Session: 20220315003652
Date Time Interrogation Session: 20220317003659
Implantable Lead Implant Date: 20180808
Implantable Lead Implant Date: 20180808
Implantable Lead Implant Date: 20180808
Implantable Lead Implant Date: 20180808
Implantable Lead Location: 753859
Implantable Lead Location: 753859
Implantable Lead Location: 753860
Implantable Lead Location: 753860
Implantable Lead Model: 3830
Implantable Lead Model: 3830
Implantable Lead Model: 5076
Implantable Lead Model: 5076
Implantable Pulse Generator Implant Date: 20180808
Implantable Pulse Generator Implant Date: 20180808
Lead Channel Impedance Value: 285 Ohm
Lead Channel Impedance Value: 285 Ohm
Lead Channel Impedance Value: 342 Ohm
Lead Channel Impedance Value: 342 Ohm
Lead Channel Impedance Value: 399 Ohm
Lead Channel Impedance Value: 418 Ohm
Lead Channel Impedance Value: 532 Ohm
Lead Channel Impedance Value: 532 Ohm
Lead Channel Pacing Threshold Amplitude: 0.75 V
Lead Channel Pacing Threshold Amplitude: 0.75 V
Lead Channel Pacing Threshold Amplitude: 0.875 V
Lead Channel Pacing Threshold Amplitude: 0.875 V
Lead Channel Pacing Threshold Pulse Width: 0.4 ms
Lead Channel Pacing Threshold Pulse Width: 0.4 ms
Lead Channel Pacing Threshold Pulse Width: 0.4 ms
Lead Channel Pacing Threshold Pulse Width: 0.4 ms
Lead Channel Sensing Intrinsic Amplitude: 2.75 mV
Lead Channel Sensing Intrinsic Amplitude: 2.75 mV
Lead Channel Sensing Intrinsic Amplitude: 2.875 mV
Lead Channel Sensing Intrinsic Amplitude: 2.875 mV
Lead Channel Sensing Intrinsic Amplitude: 6.875 mV
Lead Channel Sensing Intrinsic Amplitude: 6.875 mV
Lead Channel Sensing Intrinsic Amplitude: 8 mV
Lead Channel Sensing Intrinsic Amplitude: 8 mV
Lead Channel Setting Pacing Amplitude: 2 V
Lead Channel Setting Pacing Amplitude: 2 V
Lead Channel Setting Pacing Amplitude: 2.5 V
Lead Channel Setting Pacing Amplitude: 2.5 V
Lead Channel Setting Pacing Pulse Width: 0.4 ms
Lead Channel Setting Pacing Pulse Width: 0.4 ms
Lead Channel Setting Sensing Sensitivity: 0.9 mV
Lead Channel Setting Sensing Sensitivity: 0.9 mV

## 2021-01-14 ENCOUNTER — Ambulatory Visit (INDEPENDENT_AMBULATORY_CARE_PROVIDER_SITE_OTHER): Payer: Medicare Other | Admitting: Psychology

## 2021-01-14 DIAGNOSIS — F3289 Other specified depressive episodes: Secondary | ICD-10-CM

## 2021-01-18 NOTE — Progress Notes (Signed)
Remote pacemaker transmission.   

## 2021-01-29 ENCOUNTER — Ambulatory Visit (INDEPENDENT_AMBULATORY_CARE_PROVIDER_SITE_OTHER): Payer: Medicare Other | Admitting: Psychology

## 2021-01-29 DIAGNOSIS — F3289 Other specified depressive episodes: Secondary | ICD-10-CM

## 2021-02-06 ENCOUNTER — Other Ambulatory Visit: Payer: Self-pay | Admitting: Internal Medicine

## 2021-02-14 ENCOUNTER — Ambulatory Visit (INDEPENDENT_AMBULATORY_CARE_PROVIDER_SITE_OTHER): Payer: Medicare Other | Admitting: Psychology

## 2021-02-14 DIAGNOSIS — F3289 Other specified depressive episodes: Secondary | ICD-10-CM

## 2021-02-26 ENCOUNTER — Other Ambulatory Visit: Payer: Self-pay

## 2021-02-26 ENCOUNTER — Telehealth (INDEPENDENT_AMBULATORY_CARE_PROVIDER_SITE_OTHER): Payer: Medicare Other | Admitting: Internal Medicine

## 2021-02-26 ENCOUNTER — Encounter: Payer: Self-pay | Admitting: Internal Medicine

## 2021-02-26 DIAGNOSIS — J441 Chronic obstructive pulmonary disease with (acute) exacerbation: Secondary | ICD-10-CM | POA: Diagnosis not present

## 2021-02-26 MED ORDER — PREDNISONE 20 MG PO TABS: 40.0000 mg | ORAL_TABLET | Freq: Every day | ORAL | 0 refills | Status: DC

## 2021-02-26 MED ORDER — AZITHROMYCIN 250 MG PO TABS: ORAL_TABLET | ORAL | 0 refills | Status: DC

## 2021-02-26 NOTE — Progress Notes (Signed)
Subjective:    Patient ID: Heidi Carroll, female    DOB: 07/27/1941, 80 y.o.   MRN: [id number]  HPI Video virtual visit due to respiratory symptoms Identification done Reviewed limitations and billing and she gave consent Participants--patient in her home and I am in my office  Started as a sinus drip-- about a week ago Has to blow out her head for an hour every morning Then progressed to "no energy, no appetite" Has been exposed to resident with positive COVID (back to volunteering at [facility]) Chronic SOB--not clearly worse. Not much wheezing Uses the albuterol --but not persistently  No fever Screened at [facility] ---last there on 4/29 Occasional sputum with cough No headache  Hasn't tested for COVID Has had booster  Current Outpatient Medications on File Prior to Visit  Medication Sig Dispense Refill  . acetaminophen (TYLENOL) 500 MG tablet Take 500 mg by mouth daily as needed for moderate pain or headache.    . albuterol (VENTOLIN HFA) 108 (90 Base) MCG/ACT inhaler INHALE 2 PUFFS BY MOUTH EVERY 6 HOURS AS NEEDED FOR WHEEZING OR SHORTNESS OF BREATH 18 g 2  . ALPRAZolam (XANAX) 0.25 MG tablet 1/2 to 1 by mouth twice a day as needed 60 tablet 0  . aspirin EC 81 MG tablet Take 81 mg by mouth every evening.     . Calcium Carbonate-Vitamin D (CALCIUM 600+D PO) Take 1 tablet by mouth every evening.     Marland Kitchen CARTIA XT 120 MG 24 hr capsule Take 1 capsule by mouth once daily 90 capsule 0  . flecainide (TAMBOCOR) 50 MG tablet Take 1 tablet by mouth twice daily 180 tablet 1  . gabapentin (NEURONTIN) 300 MG capsule TAKE 3 TO 4 CAPSULES BY MOUTH AT BEDTIME 120 capsule 2  . hydrALAZINE (APRESOLINE) 25 MG tablet Take one tablet (25 mg) by mouth every 6 hours as needed for a blood pressure >170 on the top number 90 tablet 0  . losartan (COZAAR) 50 MG tablet Take 1 tablet by mouth once daily 90 tablet 3  . ondansetron (ZOFRAN) 4 MG tablet Take 1 tablet (4 mg total) by mouth every 8 (eight) hours as  needed for nausea or vomiting. 20 tablet 0  . sertraline (ZOLOFT) 50 MG tablet Take 1 tablet by mouth once daily 30 tablet 11  . traZODone (DESYREL) 50 MG tablet Take 1-2 tablets (50-100 mg total) by mouth at bedtime. 60 tablet 11   No current facility-administered medications on file prior to visit.    Allergies  Allergen Reactions  . Cymbalta [Duloxetine Hcl] Nausea Only  . Wellbutrin [Bupropion] Other (See Comments)    Elevated blood pressure, anxiety  . Nicotine Palpitations    Nicotine Patches   . Penicillins Rash    Has patient had a PCN reaction causing immediate rash, facial/tongue/throat swelling, SOB or lightheadedness with hypotension yes Has patient had a PCN reaction causing severe rash involving mucus membranes or skin necrosis: no Has patient had a PCN reaction that required hospitalization no Has patient had a PCN reaction occurring within the last 10 years: no If all of the above answers are "NO", then may proceed with Cephalosporin use.    Past Medical History:  Diagnosis Date  . A-fib (HCC)   . Depression   . Hypertension   . Neuropathy    idiopathic  . Presence of permanent cardiac pacemaker     Past Surgical History:  Procedure Laterality Date  . A-FLUTTER ABLATION N/A 02/04/2017   Procedure:  A-Flutter Ablation;  Surgeon: Duke Salvia, MD;  Location: [facility] INVASIVE CV LAB;  Service: Cardiovascular;  Laterality: N/A;  . BUNIONECTOMY Bilateral 1995   bilateral   . CATARACT EXTRACTION W/ INTRAOCULAR LENS  IMPLANT, BILATERAL Bilateral   . INSERT / REPLACE / REMOVE PACEMAKER  06/03/2017  . PACEMAKER IMPLANT N/A 06/03/2017   Procedure: Pacemaker Implant;  Surgeon: Duke Salvia, MD;  Location: [facility] INVASIVE CV LAB;  Service: Cardiovascular;  Laterality: N/A;  . THROAT SURGERY  1980s   "nodules in my throat"  . VAGINAL DELIVERY  1963   X 1  . VAGINAL HYSTERECTOMY  1974    Family History  Problem Relation Age of Onset  . Cancer Father        colon  .  Heart attack Brother   . Diabetes Maternal Grandfather     Social History   Socioeconomic History  . Marital status: Widowed    Spouse name: Not on file  . Number of children: 1  . Years of education: Not on file  . Highest education level: Not on file  Occupational History  . Occupation: Retired, [employer] Counselling psychologist)  Tobacco Use  . Smoking status: Current Every Day Smoker    Packs/day: 1.00    Years: 60.00    Pack years: 60.00    Types: Cigarettes  . Smokeless tobacco: Never Used  Vaping Use  . Vaping Use: Never used  Substance and Sexual Activity  . Alcohol use: No  . Drug use: No  . Sexual activity: Never  Other Topics Concern  . Not on file  Social History Narrative   Husband died Aug 24, 2023      Has living will   Has health care POA--- Chaya Jan, friend   Requests DNR--- form done 12/22/14   No tube feedings if cognitively unaware   Social Determinants of Health   Financial Resource Strain: Not on file  Food Insecurity: Not on file  Transportation Needs: Not on file  Physical Activity: Not on file  Stress: Not on file  Social Connections: Not on file  Intimate Partner Violence: Not on file   Review of Systems  No N/V Eating some---"I have to make me eat"     Objective:   Physical Exam Constitutional:      General: She is not in acute distress. Pulmonary:     Comments: Very slightly labored breathing Neurological:     Mental Status: She is alert.            Assessment & Plan:

## 2021-02-26 NOTE — Assessment & Plan Note (Signed)
Has mild symptoms  Could be COVID but over a week now Discussed going to urgent care if she worsens Will try prednisone and z-pak

## 2021-02-27 ENCOUNTER — Ambulatory Visit: Payer: Medicare Other | Admitting: Internal Medicine

## 2021-03-01 ENCOUNTER — Ambulatory Visit: Payer: Medicare Other | Admitting: Psychology

## 2021-03-12 ENCOUNTER — Ambulatory Visit (INDEPENDENT_AMBULATORY_CARE_PROVIDER_SITE_OTHER): Payer: Medicare Other | Admitting: Psychology

## 2021-03-12 DIAGNOSIS — F3289 Other specified depressive episodes: Secondary | ICD-10-CM | POA: Diagnosis not present

## 2021-03-22 ENCOUNTER — Other Ambulatory Visit: Payer: Self-pay | Admitting: Internal Medicine

## 2021-04-08 ENCOUNTER — Ambulatory Visit (INDEPENDENT_AMBULATORY_CARE_PROVIDER_SITE_OTHER): Payer: Medicare Other | Admitting: Psychology

## 2021-04-08 DIAGNOSIS — F3289 Other specified depressive episodes: Secondary | ICD-10-CM

## 2021-04-10 ENCOUNTER — Other Ambulatory Visit: Payer: Self-pay | Admitting: Internal Medicine

## 2021-04-11 ENCOUNTER — Ambulatory Visit (INDEPENDENT_AMBULATORY_CARE_PROVIDER_SITE_OTHER): Payer: Medicare Other

## 2021-04-11 DIAGNOSIS — I495 Sick sinus syndrome: Secondary | ICD-10-CM

## 2021-04-11 LAB — CUP PACEART REMOTE DEVICE CHECK
Battery Remaining Longevity: 106 mo
Battery Voltage: 3.01 V
Brady Statistic AP VP Percent: 0.06 %
Brady Statistic AP VS Percent: 82.48 %
Brady Statistic AS VP Percent: 0.02 %
Brady Statistic AS VS Percent: 17.44 %
Brady Statistic RA Percent Paced: 90.56 %
Brady Statistic RV Percent Paced: 0.07 %
Date Time Interrogation Session: 20220616003641
Implantable Lead Implant Date: 20180808
Implantable Lead Implant Date: 20180808
Implantable Lead Location: 753859
Implantable Lead Location: 753860
Implantable Lead Model: 3830
Implantable Lead Model: 5076
Implantable Pulse Generator Implant Date: 20180808
Lead Channel Impedance Value: 266 Ohm
Lead Channel Impedance Value: 323 Ohm
Lead Channel Impedance Value: 380 Ohm
Lead Channel Impedance Value: 513 Ohm
Lead Channel Pacing Threshold Amplitude: 0.75 V
Lead Channel Pacing Threshold Amplitude: 0.875 V
Lead Channel Pacing Threshold Pulse Width: 0.4 ms
Lead Channel Pacing Threshold Pulse Width: 0.4 ms
Lead Channel Sensing Intrinsic Amplitude: 3.25 mV
Lead Channel Sensing Intrinsic Amplitude: 3.25 mV
Lead Channel Sensing Intrinsic Amplitude: 9.375 mV
Lead Channel Sensing Intrinsic Amplitude: 9.375 mV
Lead Channel Setting Pacing Amplitude: 2 V
Lead Channel Setting Pacing Amplitude: 2.5 V
Lead Channel Setting Pacing Pulse Width: 0.4 ms
Lead Channel Setting Sensing Sensitivity: 0.9 mV

## 2021-05-02 NOTE — Progress Notes (Signed)
Remote pacemaker transmission.   

## 2021-05-08 ENCOUNTER — Ambulatory Visit: Payer: Medicare Other | Admitting: Psychology

## 2021-05-25 ENCOUNTER — Other Ambulatory Visit: Payer: Self-pay | Admitting: Internal Medicine

## 2021-05-29 ENCOUNTER — Other Ambulatory Visit: Payer: Self-pay | Admitting: Internal Medicine

## 2021-06-07 ENCOUNTER — Ambulatory Visit (INDEPENDENT_AMBULATORY_CARE_PROVIDER_SITE_OTHER): Payer: Medicare Other | Admitting: Psychology

## 2021-06-07 DIAGNOSIS — F3289 Other specified depressive episodes: Secondary | ICD-10-CM

## 2021-07-09 ENCOUNTER — Ambulatory Visit (INDEPENDENT_AMBULATORY_CARE_PROVIDER_SITE_OTHER): Payer: Medicare Other | Admitting: Psychology

## 2021-07-09 DIAGNOSIS — F3289 Other specified depressive episodes: Secondary | ICD-10-CM

## 2021-07-11 ENCOUNTER — Ambulatory Visit (INDEPENDENT_AMBULATORY_CARE_PROVIDER_SITE_OTHER): Payer: Medicare Other

## 2021-07-11 DIAGNOSIS — I495 Sick sinus syndrome: Secondary | ICD-10-CM | POA: Diagnosis not present

## 2021-07-11 LAB — CUP PACEART REMOTE DEVICE CHECK
Battery Remaining Longevity: 104 mo
Battery Voltage: 3.01 V
Brady Statistic AP VP Percent: 0.1 %
Brady Statistic AP VS Percent: 88.86 %
Brady Statistic AS VP Percent: 0.03 %
Brady Statistic AS VS Percent: 11.02 %
Brady Statistic RA Percent Paced: 93.37 %
Brady Statistic RV Percent Paced: 0.13 %
Date Time Interrogation Session: 20220913003905
Implantable Lead Implant Date: 20180808
Implantable Lead Implant Date: 20180808
Implantable Lead Location: 753859
Implantable Lead Location: 753860
Implantable Lead Model: 3830
Implantable Lead Model: 5076
Implantable Pulse Generator Implant Date: 20180808
Lead Channel Impedance Value: 266 Ohm
Lead Channel Impedance Value: 342 Ohm
Lead Channel Impedance Value: 399 Ohm
Lead Channel Impedance Value: 532 Ohm
Lead Channel Pacing Threshold Amplitude: 0.875 V
Lead Channel Pacing Threshold Amplitude: 0.875 V
Lead Channel Pacing Threshold Pulse Width: 0.4 ms
Lead Channel Pacing Threshold Pulse Width: 0.4 ms
Lead Channel Sensing Intrinsic Amplitude: 3.25 mV
Lead Channel Sensing Intrinsic Amplitude: 3.25 mV
Lead Channel Sensing Intrinsic Amplitude: 7.5 mV
Lead Channel Sensing Intrinsic Amplitude: 7.5 mV
Lead Channel Setting Pacing Amplitude: 2 V
Lead Channel Setting Pacing Amplitude: 2.5 V
Lead Channel Setting Pacing Pulse Width: 0.4 ms
Lead Channel Setting Sensing Sensitivity: 0.9 mV

## 2021-07-18 NOTE — Progress Notes (Signed)
Remote pacemaker transmission.   

## 2021-08-22 ENCOUNTER — Other Ambulatory Visit: Payer: Self-pay | Admitting: Internal Medicine

## 2021-08-22 NOTE — Telephone Encounter (Signed)
Please schedule patient for a follow up appointment with Dr Graciela Husbands. Thanks

## 2021-08-28 ENCOUNTER — Other Ambulatory Visit: Payer: Self-pay

## 2021-08-28 ENCOUNTER — Ambulatory Visit (INDEPENDENT_AMBULATORY_CARE_PROVIDER_SITE_OTHER): Payer: Medicare Other | Admitting: Internal Medicine

## 2021-08-28 VITALS — BP 146/78 | HR 84 | Ht 62.5 in | Wt 119.0 lb

## 2021-08-28 DIAGNOSIS — I10 Essential (primary) hypertension: Secondary | ICD-10-CM

## 2021-08-28 DIAGNOSIS — I495 Sick sinus syndrome: Secondary | ICD-10-CM | POA: Diagnosis not present

## 2021-08-28 DIAGNOSIS — I483 Typical atrial flutter: Secondary | ICD-10-CM

## 2021-08-28 DIAGNOSIS — Z950 Presence of cardiac pacemaker: Secondary | ICD-10-CM | POA: Diagnosis not present

## 2021-08-28 DIAGNOSIS — I480 Paroxysmal atrial fibrillation: Secondary | ICD-10-CM | POA: Diagnosis not present

## 2021-08-28 MED ORDER — FLECAINIDE ACETATE 50 MG PO TABS: 50.0000 mg | ORAL_TABLET | Freq: Two times a day (BID) | ORAL | 3 refills | Status: DC

## 2021-08-28 NOTE — Progress Notes (Signed)
Patient Care Team: Venia Carbon, MD as PCP - General (Internal Medicine)   HPI  Heidi Carroll is a 80 y.o. female Seen in follow-up for atrial flutter for which she underwent catheter ablation 4/18. She also has sinus node dysfunction and underwent pacing 8/18 Medtronic   Echocardiogram 1/18 EF normal 55-65%. Palpitations>> underwent Holter monitoring>>> PVC                       Non sustained atrial tachycardia  180 bpm but only 4 beats                       There is some suggestion of atrial tach, not fast about 110   Flecainide for atrial tach and PVCs   The patient denies chest pain, nocturnal dyspnea, orthopnea or peripheral edema.  There have been no palpitations, lightheadedness or syncope.  Complains of sob but chronic and stable.    DATE PR interval QRSduration Dose-Flecainide  5/18  132 90 0  8/18 NA 88 50  11/18 Ap 238 98 50  12/19 Ap 220 88 50  6/21 Ap 232 86 50  12/21 Ap 240 80 50     Past Medical History:  Diagnosis Date   A-fib ([place])    Depression    Hypertension    Neuropathy    idiopathic   Presence of permanent cardiac pacemaker     Past Surgical History:  Procedure Laterality Date   A-FLUTTER ABLATION N/A 02/04/2017   Procedure: A-Flutter Ablation;  Surgeon: Deboraha Sprang, MD;  Location: [place] [facility];  Service: Cardiovascular;  Laterality: N/A;   BUNIONECTOMY Bilateral 1995   bilateral    CATARACT EXTRACTION W/ INTRAOCULAR LENS  IMPLANT, BILATERAL Bilateral    INSERT / REPLACE / REMOVE PACEMAKER  06/03/2017   PACEMAKER IMPLANT N/A 06/03/2017   Procedure: Pacemaker Implant;  Surgeon: Deboraha Sprang, MD;  Location: [place] [facility];  Service: Cardiovascular;  Laterality: N/A;   THROAT SURGERY  1980s   "nodules in my throat"   VAGINAL DELIVERY  1963   X 1   VAGINAL HYSTERECTOMY  1974    Current Outpatient Medications  Medication Sig Dispense Refill   acetaminophen (TYLENOL) 500 MG tablet Take 500 mg by mouth daily as  needed for moderate pain or headache.     albuterol (VENTOLIN HFA) 108 (90 Base) MCG/ACT inhaler INHALE 2 PUFFS BY MOUTH EVERY 6 HOURS AS NEEDED FOR WHEEZING FOR SHORTNESS OF BREATH 18 g 0   ALPRAZolam (XANAX) 0.25 MG tablet 1/2 to 1 by mouth twice a day as needed 60 tablet 0   aspirin EC 81 MG tablet Take 81 mg by mouth every evening.      Calcium Carbonate-Vitamin D (CALCIUM 600+D PO) Take 1 tablet by mouth every evening.      CARTIA XT 120 MG 24 hr capsule Take 1 capsule by mouth once daily 90 capsule 0   flecainide (TAMBOCOR) 50 MG tablet Take 1 tablet (50 mg total) by mouth 2 (two) times daily. Please schedule appointment for future refills. Thank you 60 tablet 0   gabapentin (NEURONTIN) 300 MG capsule TAKE 3 TO 4 CAPSULES BY MOUTH AT BEDTIME 120 capsule 3   hydrALAZINE (APRESOLINE) 25 MG tablet Take one tablet (25 mg) by mouth every 6 hours as needed for a blood pressure >170 on the top number 90 tablet 0   losartan (COZAAR) 50 MG  tablet Take 1 tablet by mouth once daily 90 tablet 3   ondansetron (ZOFRAN) 4 MG tablet Take 1 tablet (4 mg total) by mouth every 8 (eight) hours as needed for nausea or vomiting. 20 tablet 0   predniSONE (DELTASONE) 20 MG tablet Take 2 tablets (40 mg total) by mouth daily. For 5 days, then 1 tab daily for 5 days 15 tablet 0   sertraline (ZOLOFT) 50 MG tablet Take 1 tablet by mouth once daily 30 tablet 0   traZODone (DESYREL) 50 MG tablet TAKE 1 TO 2 TABLETS BY MOUTH AT BEDTIME 60 tablet 11   No current facility-administered medications for this visit.    Allergies  Allergen Reactions   Cymbalta [Duloxetine Hcl] Nausea Only   Wellbutrin [Bupropion] Other (See Comments)    Elevated blood pressure, anxiety   Nicotine Palpitations    Nicotine Patches    Penicillins Rash    Has patient had a PCN reaction causing immediate rash, facial/tongue/throat swelling, SOB or lightheadedness with hypotension yes Has patient had a PCN reaction causing severe rash  involving mucus membranes or skin necrosis: no Has patient had a PCN reaction that required hospitalization no Has patient had a PCN reaction occurring within the last 10 years: no If all of the above answers are "NO", then may proceed with Cephalosporin use.      Review of Systems negative except from HPI and PMH  Physical Exam BP (!) 146/78   Pulse 84   Ht 5' 2.5" (1.588 m)   Wt 119 lb (54 kg)   SpO2 92%   BMI 21.42 kg/m  Well developed and well nourished in no acute distress HENT normal Neck supple with JVP-flat Clear Device pocket well healed; without hematoma or erythema.  There is no tethering  Regular rate and rhythm, no  gallop No / murmur Abd-soft with active BS No Clubbing cyanosis  edema Skin-warm and dry A & Oriented  Grossly normal sensory and motor function  ECG Apacing @ 84 25/10/40 PVC RVOT    Assessment and  Plan  Atrial flutter status post ablation  AFib paroxysmal/SCAF   Palpitations with PACs and PVCs-RVOT   Hypertension  Anxiety  /depression  Pacemaker Medtronic    No intercurrent atrial fibrillation so not on anticoagulation   PVC burden is not estimable by device as they are long coupled and assoc with appropriately timed Ap events-- by reprogramming 70-75 bpm were able to eliminate about 50-75% of them, continue with sleep mode on to allow "resting"  Continue flecainide 50 bid, conduction parameters stable  BP reasonably controlled -- prob higher here, not checked regularly at dwelling-- continue dilt 120   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

## 2021-08-28 NOTE — Patient Instructions (Signed)

## 2021-09-09 ENCOUNTER — Ambulatory Visit (INDEPENDENT_AMBULATORY_CARE_PROVIDER_SITE_OTHER): Payer: Medicare Other | Admitting: Psychology

## 2021-09-09 DIAGNOSIS — F3289 Other specified depressive episodes: Secondary | ICD-10-CM

## 2021-09-12 ENCOUNTER — Ambulatory Visit (INDEPENDENT_AMBULATORY_CARE_PROVIDER_SITE_OTHER): Payer: Medicare Other | Admitting: Internal Medicine

## 2021-09-12 ENCOUNTER — Encounter: Payer: Self-pay | Admitting: Internal Medicine

## 2021-09-12 ENCOUNTER — Other Ambulatory Visit: Payer: Self-pay

## 2021-09-12 VITALS — BP 134/86 | HR 77 | Temp 97.1°F | Ht 62.0 in | Wt 121.0 lb

## 2021-09-12 DIAGNOSIS — G609 Hereditary and idiopathic neuropathy, unspecified: Secondary | ICD-10-CM | POA: Diagnosis not present

## 2021-09-12 DIAGNOSIS — I700 Atherosclerosis of aorta: Secondary | ICD-10-CM | POA: Diagnosis not present

## 2021-09-12 DIAGNOSIS — J449 Chronic obstructive pulmonary disease, unspecified: Secondary | ICD-10-CM | POA: Diagnosis not present

## 2021-09-12 DIAGNOSIS — E441 Mild protein-calorie malnutrition: Secondary | ICD-10-CM

## 2021-09-12 DIAGNOSIS — I480 Paroxysmal atrial fibrillation: Secondary | ICD-10-CM

## 2021-09-12 DIAGNOSIS — F330 Major depressive disorder, recurrent, mild: Secondary | ICD-10-CM

## 2021-09-12 DIAGNOSIS — Z0000 Encounter for general adult medical examination without abnormal findings: Secondary | ICD-10-CM

## 2021-09-12 MED ORDER — ALBUTEROL SULFATE HFA 108 (90 BASE) MCG/ACT IN AERS: 2.0000 | INHALATION_SPRAY | Freq: Four times a day (QID) | RESPIRATORY_TRACT | 1 refills | Status: DC | PRN

## 2021-09-12 MED ORDER — GABAPENTIN 300 MG PO CAPS: ORAL_CAPSULE | ORAL | 11 refills | Status: DC

## 2021-09-12 NOTE — Assessment & Plan Note (Signed)
Weight has stabilized now--but 15-20# below former baseline

## 2021-09-12 NOTE — Assessment & Plan Note (Signed)
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

## 2021-09-12 NOTE — Assessment & Plan Note (Signed)
On imaging No statin 

## 2021-09-12 NOTE — Patient Instructions (Signed)
Please get your bivalent COVID vaccine.

## 2021-09-12 NOTE — Progress Notes (Signed)
Subjective:    Patient ID: Heidi Carroll, female    DOB: July 25, 1941, 80 y.o.   MRN: [id number]  HPI Here for Medicare wellness visit and follow up of chronic health conditions This visit occurred during the SARS-CoV-2 public health emergency.  Safety protocols were in place, including screening questions prior to the visit, additional usage of staff PPE, and extensive cleaning of exam room while observing appropriate contact time as indicated for disinfecting solutions.   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     albuterol (VENTOLIN HFA) 108 (90 Base) MCG/ACT inhaler INHALE 2 PUFFS BY MOUTH EVERY 6 HOURS AS NEEDED FOR WHEEZING FOR SHORTNESS OF BREATH 18 g 0   ALPRAZolam (XANAX) 0.25 MG tablet 1/2 to 1 by mouth twice a day as needed 60 tablet 0   aspirin EC 81 MG tablet Take 81 mg by mouth every evening.      Calcium Carbonate-Vitamin D (CALCIUM 600+D PO) Take 1 tablet by mouth every evening.      CARTIA XT 120 MG 24 hr  capsule Take 1 capsule by mouth once daily 90 capsule 0   flecainide (TAMBOCOR) 50 MG tablet Take 1 tablet (50 mg total) by mouth 2 (two) times daily. Please schedule appointment for future refills. Thank you 180 tablet 3   gabapentin (NEURONTIN) 300 MG capsule TAKE 3 TO 4 CAPSULES BY MOUTH AT BEDTIME 120 capsule 3   losartan (COZAAR) 50 MG tablet Take 1 tablet by mouth once daily 90 tablet 3   sertraline (ZOLOFT) 50 MG tablet Take 1 tablet by mouth once daily 30 tablet 0   traZODone (DESYREL) 50 MG tablet TAKE 1 TO 2 TABLETS BY MOUTH AT BEDTIME 60 tablet 11   hydrALAZINE (APRESOLINE) 25 MG tablet Take one tablet (25 mg) by mouth every 6 hours as needed for a blood pressure >170 on the top number (Patient not taking: Reported on 09/12/2021) 90 tablet 0   No current facility-administered medications on file prior to visit.    Allergies  Allergen Reactions   Cymbalta [Duloxetine Hcl] Nausea Only   Wellbutrin [Bupropion] Other (See Comments)    Elevated blood pressure, anxiety   Nicotine Palpitations    Nicotine Patches    Penicillins Rash    Has patient had a PCN reaction causing immediate rash, facial/tongue/throat swelling, SOB or lightheadedness with hypotension yes Has patient had a PCN reaction causing severe rash involving mucus membranes or skin necrosis: no Has patient had a PCN reaction that required hospitalization no Has patient had a PCN reaction occurring within the last 10 years: no If all  of the above answers are "NO", then may proceed with Cephalosporin use.    Past Medical History:  Diagnosis Date   A-fib [facility])    Depression    Hypertension    Neuropathy    idiopathic   Presence of permanent cardiac pacemaker     Past Surgical History:  Procedure Laterality Date   A-FLUTTER ABLATION N/A 02/04/2017   Procedure: A-Flutter Ablation;  Surgeon: Deboraha Sprang, MD;  Location: [place] [facility];  Service: Cardiovascular;  Laterality: N/A;   BUNIONECTOMY Bilateral 1995    bilateral    CATARACT EXTRACTION W/ INTRAOCULAR LENS  IMPLANT, BILATERAL Bilateral    INSERT / REPLACE / REMOVE PACEMAKER  06/03/2017   PACEMAKER IMPLANT N/A 06/03/2017   Procedure: Pacemaker Implant;  Surgeon: Deboraha Sprang, MD;  Location: [place] [facility];  Service: Cardiovascular;  Laterality: N/A;   THROAT SURGERY  1980s   "nodules in my throat"   VAGINAL DELIVERY  1963   X 1   VAGINAL HYSTERECTOMY  1974    Family History  Problem Relation Age of Onset   Cancer Father        colon   Heart attack Brother    Diabetes Maternal Grandfather     Social History   Socioeconomic History   Marital status: Widowed    Spouse name: Not on file   Number of children: 1   Years of education: Not on file   Highest education level: Not on file  Occupational History   Occupation: Retired, [employer] Engineer, building services)  Tobacco Use   Smoking status: Every Day    Packs/day: 1.00    Years: 60.00    Pack years: 60.00    Types: Cigarettes   Smokeless tobacco: Never  Vaping Use   Vaping Use: Never used  Substance and Sexual Activity   Alcohol use: No   Drug use: No   Sexual activity: Never  Other Topics Concern   Not on file  Social History Narrative   Husband died 08-13-23      Has living will   Has health care POA--- Essie Hart, friend   Requests DNR--- form done 12/22/14   No tube feedings if cognitively unaware   Social Determinants of Health   Financial Resource Strain: Not on file  Food Insecurity: Not on file  Transportation Needs: Not on file  Physical Activity: Not on file  Stress: Not on file  Social Connections: Not on file  Intimate Partner Violence: Not on file   Review of Systems Eating fair Weight is holding at her lower level Usually has one bad night a week--on the trazodone Wears seat belt Has implants---keeps up with dentist No suspicious skin lesions No heartburn or dysphagia Bowels move fine---no blood No dysuria or hematuria. Some urgency---wears  pads just in case No sig back or joint pains     Objective:   Physical Exam Constitutional:      Appearance: Normal appearance.  HENT:     Mouth/Throat:     Comments: No lesions Eyes:     Conjunctiva/sclera: Conjunctivae normal.     Pupils: Pupils are equal, round, and reactive to light.  Cardiovascular:     Rate and Rhythm: Normal rate and regular rhythm.     Heart sounds: No murmur heard.   No gallop.     Comments: Very faint pedal pulses Pulmonary:     Effort: Pulmonary effort is normal.     Breath sounds: No wheezing or rales.  Comments: Decreased breath sounds but clear Abdominal:     Palpations: Abdomen is soft.     Tenderness: There is no abdominal tenderness.  Musculoskeletal:     Cervical back: Neck supple.  Lymphadenopathy:     Cervical: No cervical adenopathy.  Skin:    Findings: No lesion or rash.  Neurological:     Mental Status: She is alert and oriented to person, place, and time.     Comments: President---"Biden, Trump, Obama" [id number] Recall 3/3  Psychiatric:        Mood and Affect: Mood normal.        Behavior: Behavior normal.           Assessment & Plan:

## 2021-09-12 NOTE — Assessment & Plan Note (Signed)
Paced ASA only for anticoagulation

## 2021-09-12 NOTE — Assessment & Plan Note (Addendum)
Still just can't stop smoking Easy DOE  Uses albuterol prn

## 2021-09-12 NOTE — Assessment & Plan Note (Signed)
I have personally reviewed the Medicare Annual Wellness questionnaire and have noted 1. The patient's medical and social history 2. Their use of alcohol, tobacco or illicit drugs 3. Their current medications and supplements 4. The patient's functional ability including ADL's, fall risks, home safety risks and hearing or visual             impairment. 5. Diet and physical activities 6. Evidence for depression or mood disorders  The patients weight, height, BMI and visual acuity have been recorded in the chart I have made referrals, counseling and provided education to the patient based review of the above and I have provided the pt with a written personalized care plan for preventive services.  I have provided you with a copy of your personalized plan for preventive services. Please take the time to review along with your updated medication list.  Done with cancer screening Needs bivalent COVID vaccine Urged her to at least cut down on cigarettes Should add resistance work to her walking

## 2021-09-12 NOTE — Assessment & Plan Note (Signed)
Does okay with 2 gabapentin at bedtime

## 2021-09-12 NOTE — Progress Notes (Signed)
Vision Screening   Right eye Left eye Both eyes  Without correction     With correction 20/25 20/25 20/20  Hearing Screening - Comments:: Passed whisper test  

## 2021-09-13 LAB — CBC
HCT: 44.7 % (ref 36.0–46.0)
Hemoglobin: 15 g/dL (ref 12.0–15.0)
MCHC: 33.6 g/dL (ref 30.0–36.0)
MCV: 99.5 fl (ref 78.0–100.0)
Platelets: 153 10*3/uL (ref 150.0–400.0)
RBC: 4.49 Mil/uL (ref 3.87–5.11)
RDW: 13.2 % (ref 11.5–15.5)
WBC: 7.7 10*3/uL (ref 4.0–10.5)

## 2021-09-13 LAB — COMPREHENSIVE METABOLIC PANEL
ALT: 9 U/L (ref 0–35)
AST: 16 U/L (ref 0–37)
Albumin: 4.4 g/dL (ref 3.5–5.2)
Alkaline Phosphatase: 81 U/L (ref 39–117)
BUN: 19 mg/dL (ref 6–23)
CO2: 28 mEq/L (ref 19–32)
Calcium: 9.2 mg/dL (ref 8.4–10.5)
Chloride: 101 mEq/L (ref 96–112)
Creatinine, Ser: 0.65 mg/dL (ref 0.40–1.20)
GFR: 83.81 mL/min (ref >60.00)
Glucose, Bld: 83 mg/dL (ref 70–99)
Potassium: 4.2 mEq/L (ref 3.5–5.1)
Sodium: 139 mEq/L (ref 135–145)
Total Bilirubin: 0.5 mg/dL (ref 0.2–1.2)
Total Protein: 6.6 g/dL (ref 6.0–8.3)

## 2021-09-13 LAB — T4, FREE: Free T4: 0.75 ng/dL (ref 0.60–1.60)

## 2021-09-26 ENCOUNTER — Other Ambulatory Visit: Payer: Self-pay | Admitting: Internal Medicine

## 2021-10-04 ENCOUNTER — Telehealth: Payer: Self-pay | Admitting: Internal Medicine

## 2021-10-04 MED ORDER — LOSARTAN POTASSIUM 50 MG PO TABS: 50.0000 mg | ORAL_TABLET | Freq: Every day | ORAL | 2 refills | Status: DC

## 2021-10-04 NOTE — Telephone Encounter (Signed)
*  STAT* If patient is at the pharmacy, call can be transferred to refill team.   1. Which medications need to be refilled? (please list name of each medication and dose if known) losartan 50 mg po q d   2. Which pharmacy/location (including street and city if local pharmacy) is medication to be sent to? [facility] [place]   3. Do they need a 30 day or 90 day supply? 90

## 2021-10-04 NOTE — Telephone Encounter (Signed)
Requested Prescriptions   Signed Prescriptions Disp Refills   losartan (COZAAR) 50 MG tablet 90 tablet 2    Sig: Take 1 tablet (50 mg total) by mouth daily.    Authorizing Provider: Duke Salvia    Ordering User: Thayer Headings, Deardra Hinkley L

## 2021-10-07 ENCOUNTER — Encounter: Payer: Self-pay | Admitting: Internal Medicine

## 2021-10-10 ENCOUNTER — Ambulatory Visit (INDEPENDENT_AMBULATORY_CARE_PROVIDER_SITE_OTHER): Payer: Medicare Other

## 2021-10-10 DIAGNOSIS — I495 Sick sinus syndrome: Secondary | ICD-10-CM | POA: Diagnosis not present

## 2021-10-10 LAB — CUP PACEART REMOTE DEVICE CHECK
Battery Remaining Longevity: 99 mo
Battery Voltage: 3.01 V
Brady Statistic AP VP Percent: 0.04 %
Brady Statistic AP VS Percent: 38.72 %
Brady Statistic AS VP Percent: 0.05 %
Brady Statistic AS VS Percent: 61.19 %
Brady Statistic RA Percent Paced: 39.1 %
Brady Statistic RV Percent Paced: 0.09 %
Date Time Interrogation Session: 20221214233954
Implantable Lead Implant Date: 20180808
Implantable Lead Implant Date: 20180808
Implantable Lead Location: 753859
Implantable Lead Location: 753860
Implantable Lead Model: 3830
Implantable Lead Model: 5076
Implantable Pulse Generator Implant Date: 20180808
Lead Channel Impedance Value: 247 Ohm
Lead Channel Impedance Value: 304 Ohm
Lead Channel Impedance Value: 380 Ohm
Lead Channel Impedance Value: 513 Ohm
Lead Channel Pacing Threshold Amplitude: 0.75 V
Lead Channel Pacing Threshold Amplitude: 1 V
Lead Channel Pacing Threshold Pulse Width: 0.4 ms
Lead Channel Pacing Threshold Pulse Width: 0.4 ms
Lead Channel Sensing Intrinsic Amplitude: 1.625 mV
Lead Channel Sensing Intrinsic Amplitude: 1.625 mV
Lead Channel Sensing Intrinsic Amplitude: 8.125 mV
Lead Channel Sensing Intrinsic Amplitude: 8.125 mV
Lead Channel Setting Pacing Amplitude: 2 V
Lead Channel Setting Pacing Amplitude: 2.5 V
Lead Channel Setting Pacing Pulse Width: 0.4 ms
Lead Channel Setting Sensing Sensitivity: 0.9 mV

## 2021-10-15 NOTE — Telephone Encounter (Signed)
Already seen unable to close encounter on scheduling en

## 2021-10-22 NOTE — Progress Notes (Signed)
Remote pacemaker transmission.   

## 2021-11-20 ENCOUNTER — Encounter: Payer: Self-pay | Admitting: Internal Medicine

## 2021-11-25 ENCOUNTER — Ambulatory Visit: Payer: Medicare Other | Admitting: Psychology

## 2022-01-09 ENCOUNTER — Ambulatory Visit (INDEPENDENT_AMBULATORY_CARE_PROVIDER_SITE_OTHER): Payer: Medicare Other

## 2022-01-09 DIAGNOSIS — I495 Sick sinus syndrome: Secondary | ICD-10-CM | POA: Diagnosis not present

## 2022-01-09 LAB — CUP PACEART REMOTE DEVICE CHECK
Battery Remaining Longevity: 98 mo
Battery Voltage: 3 V
Brady Statistic AP VP Percent: 0.07 %
Brady Statistic AP VS Percent: 99.35 %
Brady Statistic AS VP Percent: 0 %
Brady Statistic AS VS Percent: 0.58 %
Brady Statistic RA Percent Paced: 99.25 %
Brady Statistic RV Percent Paced: 0.16 %
Date Time Interrogation Session: 20230316004118
Implantable Lead Implant Date: 20180808
Implantable Lead Implant Date: 20180808
Implantable Lead Location: 753859
Implantable Lead Location: 753860
Implantable Lead Model: 3830
Implantable Lead Model: 5076
Implantable Pulse Generator Implant Date: 20180808
Lead Channel Impedance Value: 285 Ohm
Lead Channel Impedance Value: 342 Ohm
Lead Channel Impedance Value: 399 Ohm
Lead Channel Impedance Value: 532 Ohm
Lead Channel Pacing Threshold Amplitude: 0.875 V
Lead Channel Pacing Threshold Amplitude: 0.875 V
Lead Channel Pacing Threshold Pulse Width: 0.4 ms
Lead Channel Pacing Threshold Pulse Width: 0.4 ms
Lead Channel Sensing Intrinsic Amplitude: 3 mV
Lead Channel Sensing Intrinsic Amplitude: 3 mV
Lead Channel Sensing Intrinsic Amplitude: 6.5 mV
Lead Channel Sensing Intrinsic Amplitude: 6.5 mV
Lead Channel Setting Pacing Amplitude: 2 V
Lead Channel Setting Pacing Amplitude: 2.5 V
Lead Channel Setting Pacing Pulse Width: 0.4 ms
Lead Channel Setting Sensing Sensitivity: 0.9 mV

## 2022-01-17 NOTE — Progress Notes (Signed)
Remote pacemaker transmission.   

## 2022-02-07 ENCOUNTER — Other Ambulatory Visit: Payer: Self-pay | Admitting: Internal Medicine

## 2022-02-09 ENCOUNTER — Other Ambulatory Visit: Payer: Self-pay | Admitting: Internal Medicine

## 2022-02-11 NOTE — Telephone Encounter (Signed)
Patient returned call to confirm dosage. Patient is still taking 120 mg qd ?

## 2022-02-11 NOTE — Telephone Encounter (Signed)
Lmovm to verify if pt is taking Diltiazem 120 mg capsule qd. ?

## 2022-04-10 ENCOUNTER — Ambulatory Visit (INDEPENDENT_AMBULATORY_CARE_PROVIDER_SITE_OTHER): Payer: Medicare Other

## 2022-04-10 DIAGNOSIS — I495 Sick sinus syndrome: Secondary | ICD-10-CM | POA: Diagnosis not present

## 2022-04-10 LAB — CUP PACEART REMOTE DEVICE CHECK
Battery Remaining Longevity: 97 mo
Battery Voltage: 3 V
Brady Statistic AP VP Percent: 0.08 %
Brady Statistic AP VS Percent: 99.61 %
Brady Statistic AS VP Percent: 0 %
Brady Statistic AS VS Percent: 0.31 %
Brady Statistic RA Percent Paced: 99.74 %
Brady Statistic RV Percent Paced: 0.08 %
Date Time Interrogation Session: 20230615004107
Implantable Lead Implant Date: 20180808
Implantable Lead Implant Date: 20180808
Implantable Lead Location: 753859
Implantable Lead Location: 753860
Implantable Lead Model: 3830
Implantable Lead Model: 5076
Implantable Pulse Generator Implant Date: 20180808
Lead Channel Impedance Value: 285 Ohm
Lead Channel Impedance Value: 361 Ohm
Lead Channel Impedance Value: 399 Ohm
Lead Channel Impedance Value: 532 Ohm
Lead Channel Pacing Threshold Amplitude: 0.875 V
Lead Channel Pacing Threshold Amplitude: 0.875 V
Lead Channel Pacing Threshold Pulse Width: 0.4 ms
Lead Channel Pacing Threshold Pulse Width: 0.4 ms
Lead Channel Sensing Intrinsic Amplitude: 3.25 mV
Lead Channel Sensing Intrinsic Amplitude: 3.25 mV
Lead Channel Sensing Intrinsic Amplitude: 6.625 mV
Lead Channel Sensing Intrinsic Amplitude: 6.625 mV
Lead Channel Setting Pacing Amplitude: 2 V
Lead Channel Setting Pacing Amplitude: 2.5 V
Lead Channel Setting Pacing Pulse Width: 0.4 ms
Lead Channel Setting Sensing Sensitivity: 0.9 mV

## 2022-04-18 ENCOUNTER — Encounter: Payer: Self-pay | Admitting: Internal Medicine

## 2022-04-23 ENCOUNTER — Other Ambulatory Visit: Payer: Self-pay | Admitting: Internal Medicine

## 2022-05-18 ENCOUNTER — Other Ambulatory Visit: Payer: Self-pay | Admitting: Internal Medicine

## 2022-05-19 ENCOUNTER — Other Ambulatory Visit: Payer: Self-pay | Admitting: Internal Medicine

## 2022-06-01 ENCOUNTER — Other Ambulatory Visit: Payer: Self-pay | Admitting: Internal Medicine

## 2022-06-25 ENCOUNTER — Other Ambulatory Visit: Payer: Self-pay | Admitting: Internal Medicine

## 2022-07-04 ENCOUNTER — Other Ambulatory Visit: Payer: Self-pay | Admitting: Internal Medicine

## 2022-07-10 ENCOUNTER — Ambulatory Visit (INDEPENDENT_AMBULATORY_CARE_PROVIDER_SITE_OTHER): Payer: Medicare Other

## 2022-07-10 DIAGNOSIS — I495 Sick sinus syndrome: Secondary | ICD-10-CM

## 2022-07-10 LAB — CUP PACEART REMOTE DEVICE CHECK
Battery Remaining Longevity: 94 mo
Battery Voltage: 3 V
Brady Statistic AP VP Percent: 0.12 %
Brady Statistic AP VS Percent: 98.58 %
Brady Statistic AS VP Percent: 0 %
Brady Statistic AS VS Percent: 1.3 %
Brady Statistic RA Percent Paced: 98.82 %
Brady Statistic RV Percent Paced: 0.12 %
Date Time Interrogation Session: 20230914004107
Implantable Lead Implant Date: 20180808
Implantable Lead Implant Date: 20180808
Implantable Lead Location: 753859
Implantable Lead Location: 753860
Implantable Lead Model: 3830
Implantable Lead Model: 5076
Implantable Pulse Generator Implant Date: 20180808
Lead Channel Impedance Value: 285 Ohm
Lead Channel Impedance Value: 342 Ohm
Lead Channel Impedance Value: 380 Ohm
Lead Channel Impedance Value: 513 Ohm
Lead Channel Pacing Threshold Amplitude: 0.75 V
Lead Channel Pacing Threshold Amplitude: 0.875 V
Lead Channel Pacing Threshold Pulse Width: 0.4 ms
Lead Channel Pacing Threshold Pulse Width: 0.4 ms
Lead Channel Sensing Intrinsic Amplitude: 1.5 mV
Lead Channel Sensing Intrinsic Amplitude: 1.5 mV
Lead Channel Sensing Intrinsic Amplitude: 6.125 mV
Lead Channel Sensing Intrinsic Amplitude: 6.125 mV
Lead Channel Setting Pacing Amplitude: 2 V
Lead Channel Setting Pacing Amplitude: 2.5 V
Lead Channel Setting Pacing Pulse Width: 0.4 ms
Lead Channel Setting Sensing Sensitivity: 0.9 mV

## 2022-07-23 NOTE — Progress Notes (Signed)
Remote pacemaker transmission.   

## 2022-08-02 ENCOUNTER — Other Ambulatory Visit: Payer: Self-pay | Admitting: Internal Medicine

## 2022-08-12 ENCOUNTER — Other Ambulatory Visit: Payer: Self-pay | Admitting: Internal Medicine

## 2022-08-26 ENCOUNTER — Ambulatory Visit: Payer: Medicare Other | Attending: Internal Medicine | Admitting: Internal Medicine

## 2022-08-26 ENCOUNTER — Encounter: Payer: Self-pay | Admitting: Internal Medicine

## 2022-08-26 VITALS — BP 130/86 | HR 82 | Ht 62.5 in | Wt 124.0 lb

## 2022-08-26 DIAGNOSIS — I10 Essential (primary) hypertension: Secondary | ICD-10-CM

## 2022-08-26 DIAGNOSIS — I483 Typical atrial flutter: Secondary | ICD-10-CM

## 2022-08-26 DIAGNOSIS — I480 Paroxysmal atrial fibrillation: Secondary | ICD-10-CM | POA: Diagnosis not present

## 2022-08-26 DIAGNOSIS — I495 Sick sinus syndrome: Secondary | ICD-10-CM | POA: Diagnosis not present

## 2022-08-26 DIAGNOSIS — Z950 Presence of cardiac pacemaker: Secondary | ICD-10-CM

## 2022-08-26 MED ORDER — FLECAINIDE ACETATE 50 MG PO TABS: 50.0000 mg | ORAL_TABLET | Freq: Two times a day (BID) | ORAL | 3 refills | Status: DC

## 2022-08-26 NOTE — Patient Instructions (Signed)
Medication Instructions:  - Your physician recommends that you continue on your current medications as directed. Please refer to the Current Medication list given to you today.  *If you need a refill on your cardiac medications before your next appointment, please call your pharmacy*   Lab Work: - none ordered  If you have labs (blood work) drawn today and your tests are completely normal, you will receive your results only by: MyChart Message (if you have MyChart) OR A paper copy in the mail If you have any lab test that is abnormal or we need to change your treatment, we will call you to review the results.   Testing/Procedures: - none ordered   Follow-Up: At [facility], you and your health needs are our priority.  As part of our continuing mission to provide you with exceptional heart care, we have created designated Provider Care Teams.  These Care Teams include your primary Cardiologist (physician) and Advanced Practice Providers (APPs -  Physician Assistants and Nurse Practitioners) who all work together to provide you with the care you need, when you need it.  We recommend signing up for the patient portal called "MyChart".  Sign up information is provided on this After Visit Summary.  MyChart is used to connect with patients for Virtual Visits (Telemedicine).  Patients are able to view lab/test results, encounter notes, upcoming appointments, etc.  Non-urgent messages can be sent to your provider as well.   To learn more about what you can do with MyChart, go to [email or web address].    Your next appointment:   1 year(s)  The format for your next appointment:   In Person  Provider:   Steven Klein, MD    Other Instructions N/a  Important Information About Sugar       

## 2022-08-26 NOTE — Progress Notes (Signed)
Patient Care Team: Venia Carbon, MD as PCP - General (Internal Medicine)   HPI  Heidi Carroll is a 81 y.o. female Seen in follow-up for atrial flutter for which she underwent catheter ablation 4/18.Sinus node dysfunction >>> pacing 8/18 Medtronic.  No recurrent atrial tachyarrhythmia--on ASA  Echocardiogram 1/18 EF normal 55-65%. Palpitations>> underwent Holter monitoring>>> PVC                       Non sustained atrial tachycardia  180 bpm but only 4 beats                       There is some suggestion of atrial tach, not fast about 110   Flecainide for atrial tach and PVCs   The patient denies chest pain,  nocturnal dyspnea, orthopnea or peripheral edema.  There have been no palpitations, lightheadedness or syncope.  Chronic shortness of breath.  She told JS-RN that her son that she gave up for adoption 68 years ago at the age of 45 reached out to her and contacted her they met over Labor Day and he is bringing his family to see her this weekend.  "Overwhelming "  She reminds me that she has not seen her grandchildren since her son died in 52 (suicide)  ( now 27 and 79) "is what it is"   DATE PR interval QRSduration Dose-Flecainide  5/18  132 90 0  8/18 NA 88 50  11/18 Ap 238 98 50  12/19 Ap 220 88 50  6/21 Ap 232 86 50  12/21 Ap 240 80 50  10/23 Ap 240 76 50     Past Medical History:  Diagnosis Date   A-fib ([place])    Depression    Hypertension    Neuropathy    idiopathic   Presence of permanent cardiac pacemaker     Past Surgical History:  Procedure Laterality Date   A-FLUTTER ABLATION N/A 02/04/2017   Procedure: A-Flutter Ablation;  Surgeon: Deboraha Sprang, MD;  Location: [place] [facility];  Service: Cardiovascular;  Laterality: N/A;   BUNIONECTOMY Bilateral 1995   bilateral    CATARACT EXTRACTION W/ INTRAOCULAR LENS  IMPLANT, BILATERAL Bilateral    INSERT / REPLACE / REMOVE PACEMAKER  06/03/2017   PACEMAKER IMPLANT N/A 06/03/2017   Procedure:  Pacemaker Implant;  Surgeon: Deboraha Sprang, MD;  Location: [place] [facility];  Service: Cardiovascular;  Laterality: N/A;   THROAT SURGERY  1980s   "nodules in my throat"   VAGINAL DELIVERY  1963   X 1   VAGINAL HYSTERECTOMY  1974    Current Outpatient Medications  Medication Sig Dispense Refill   acetaminophen (TYLENOL) 500 MG tablet Take 500 mg by mouth daily as needed for moderate pain or headache.     albuterol (VENTOLIN HFA) 108 (90 Base) MCG/ACT inhaler INHALE 2 PUFFS BY MOUTH EVERY 6 HOURS AS NEEDED FOR WHEEZING OR SHORTNESS OF BREATH 18 g 0   ALPRAZolam (XANAX) 0.25 MG tablet 1/2 to 1 by mouth twice a day as needed 60 tablet 0   aspirin EC 81 MG tablet Take 81 mg by mouth every evening.      Calcium Carbonate-Vitamin D (CALCIUM 600+D PO) Take 1 tablet by mouth every evening.      diltiazem (CARDIZEM CD) 120 MG 24 hr capsule Take 1 capsule by mouth once daily 90 capsule 0   flecainide (TAMBOCOR) 50 MG tablet  Take 1 tablet (50 mg total) by mouth 2 (two) times daily. Please schedule appointment for future refills. Thank you 180 tablet 3   gabapentin (NEURONTIN) 300 MG capsule TAKE 3 TO 4 CAPSULES BY MOUTH AT BEDTIME 120 capsule 11   hydrALAZINE (APRESOLINE) 25 MG tablet Take one tablet (25 mg) by mouth every 6 hours as needed for a blood pressure >170 on the top number 90 tablet 0   losartan (COZAAR) 50 MG tablet Take 1 tablet (50 mg total) by mouth daily. PLEASE SCHEDULE OFFICE VISIT FOR FURTHER REFILLS. THANK YOU! 90 tablet 0   Multiple Vitamins-Minerals (CENTRUM ADULTS PO) Take 1 tablet by mouth daily.     sertraline (ZOLOFT) 50 MG tablet Take 1 tablet by mouth once daily 90 tablet 3   traZODone (DESYREL) 50 MG tablet TAKE 1 TO 2 TABLETS BY MOUTH AT BEDTIME 60 tablet 2   No current facility-administered medications for this visit.    Allergies  Allergen Reactions   Cymbalta [Duloxetine Hcl] Nausea Only   Wellbutrin [Bupropion] Other (See Comments)    Elevated blood pressure,  anxiety   Nicotine Palpitations    Nicotine Patches    Penicillins Rash    Has patient had a PCN reaction causing immediate rash, facial/tongue/throat swelling, SOB or lightheadedness with hypotension yes Has patient had a PCN reaction causing severe rash involving mucus membranes or skin necrosis: no Has patient had a PCN reaction that required hospitalization no Has patient had a PCN reaction occurring within the last 10 years: no If all of the above answers are "NO", then may proceed with Cephalosporin use.      Review of Systems negative except from HPI and PMH  Physical Exam BP 130/86 (BP Location: Left Arm, Patient Position: Sitting, Cuff Size: Normal)   Pulse 82   Ht 5' 2.5" (1.588 m)   Wt 124 lb (56.2 kg)   SpO2 92%   BMI 22.32 kg/m  Well developed and well nourished in no acute distress HENT normal Neck supple with JVP-flat Clear Device pocket well healed; without hematoma or erythema.  There is no tethering  Regular rate and rhythm, no  gallop No murmur Abd-soft with active BS No Clubbing cyanosis  edema Skin-warm and dry A & Oriented  Grossly normal sensory and motor function  ECG atrial pacing at 82 Intervals 22/09/38  Assessment and  Plan  Atrial flutter status post ablation  AFib paroxysmal/SCAF   Palpitations with PACs and PVCs-RVOT   Flecainide therapy   Hypertension  Sinus Node dysfunction   Pacemaker Medtronic    No intercurrent atrial fibrillation or flutter; continue flecainide 50 mg bid; surveillance ECG stable.  SCAF so not on anticoagulation  Blood pressure well controlled we will continue losartan  blood pressure reasonably controlled  continue dilt, losartan prn hydralazine    Device function is normal. Programming changes   See Paceart for details

## 2022-08-27 LAB — CUP PACEART INCLINIC DEVICE CHECK
Battery Remaining Longevity: 95 mo
Battery Voltage: 2.99 V
Brady Statistic AP VP Percent: 0.07 %
Brady Statistic AP VS Percent: 95.04 %
Brady Statistic AS VP Percent: 0.01 %
Brady Statistic AS VS Percent: 4.87 %
Brady Statistic RA Percent Paced: 95.5 %
Brady Statistic RV Percent Paced: 0.09 %
Date Time Interrogation Session: 20231031111200
Implantable Lead Connection Status: 753985
Implantable Lead Connection Status: 753985
Implantable Lead Implant Date: 20180808
Implantable Lead Implant Date: 20180808
Implantable Lead Location: 753859
Implantable Lead Location: 753860
Implantable Lead Model: 3830
Implantable Lead Model: 5076
Implantable Pulse Generator Implant Date: 20180808
Lead Channel Impedance Value: 323 Ohm
Lead Channel Impedance Value: 380 Ohm
Lead Channel Impedance Value: 437 Ohm
Lead Channel Impedance Value: 570 Ohm
Lead Channel Pacing Threshold Amplitude: 0.75 V
Lead Channel Pacing Threshold Amplitude: 0.875 V
Lead Channel Pacing Threshold Pulse Width: 0.4 ms
Lead Channel Pacing Threshold Pulse Width: 0.4 ms
Lead Channel Sensing Intrinsic Amplitude: 2 mV
Lead Channel Sensing Intrinsic Amplitude: 2 mV
Lead Channel Sensing Intrinsic Amplitude: 6.375 mV
Lead Channel Sensing Intrinsic Amplitude: 9.875 mV
Lead Channel Setting Pacing Amplitude: 2 V
Lead Channel Setting Pacing Amplitude: 2.5 V
Lead Channel Setting Pacing Pulse Width: 0.4 ms
Lead Channel Setting Sensing Sensitivity: 0.9 mV
Zone Setting Status: 755011
Zone Setting Status: 755011

## 2022-09-15 ENCOUNTER — Encounter: Payer: Self-pay | Admitting: Internal Medicine

## 2022-09-15 ENCOUNTER — Ambulatory Visit (INDEPENDENT_AMBULATORY_CARE_PROVIDER_SITE_OTHER): Payer: Medicare Other | Admitting: Internal Medicine

## 2022-09-15 VITALS — BP 120/70 | HR 77 | Temp 97.2°F | Ht 61.5 in | Wt 124.0 lb

## 2022-09-15 DIAGNOSIS — Z23 Encounter for immunization: Secondary | ICD-10-CM

## 2022-09-15 DIAGNOSIS — I739 Peripheral vascular disease, unspecified: Secondary | ICD-10-CM | POA: Diagnosis not present

## 2022-09-15 DIAGNOSIS — Z0000 Encounter for general adult medical examination without abnormal findings: Secondary | ICD-10-CM

## 2022-09-15 DIAGNOSIS — I480 Paroxysmal atrial fibrillation: Secondary | ICD-10-CM

## 2022-09-15 DIAGNOSIS — J449 Chronic obstructive pulmonary disease, unspecified: Secondary | ICD-10-CM

## 2022-09-15 DIAGNOSIS — E441 Mild protein-calorie malnutrition: Secondary | ICD-10-CM

## 2022-09-15 DIAGNOSIS — I10 Essential (primary) hypertension: Secondary | ICD-10-CM

## 2022-09-15 DIAGNOSIS — I700 Atherosclerosis of aorta: Secondary | ICD-10-CM

## 2022-09-15 DIAGNOSIS — F39 Unspecified mood [affective] disorder: Secondary | ICD-10-CM

## 2022-09-15 MED ORDER — SERTRALINE HCL 50 MG PO TABS: 50.0000 mg | ORAL_TABLET | Freq: Every day | ORAL | 3 refills | Status: DC

## 2022-09-15 MED ORDER — ALBUTEROL SULFATE HFA 108 (90 BASE) MCG/ACT IN AERS: INHALATION_SPRAY | RESPIRATORY_TRACT | 0 refills | Status: DC

## 2022-09-15 NOTE — Progress Notes (Signed)
Subjective:    Patient ID: Heidi Carroll, female    DOB: 1941-03-02, 81 y.o.   MRN: [id number]  HPI Here for Medicare wellness visit and follow up of chronic health conditions Reviewed advanced directives Reviewed other doctors--Dr [place] [facility] No hospitalizations or surgery this year Still smoking No alcohol Vision is about the same---never got set up with eye doctor Stays active with walking at her volunteer---not doing the stationary bike lately No falls Chronic mood issues Independent with instrumental ADLs No sig memory issues  Got a call in August from a woman--turned out being her sister (hadn't recognized her voice) She had joined [place] and a man called with a 19% genetic match She had given a baby up for adoption 64 years ago and it was him She has now met him, wife and 3 children They have invited her for Thanksgiving in [place] (but she doesn't want to stay the night---pets and other)  Still smoking---son and sister now on her case No coughing Breathing mostly okay Does get AM congestion--mostly nasal  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     albuterol (VENTOLIN HFA) 108 (90 Base) MCG/ACT inhaler INHALE 2 PUFFS BY MOUTH EVERY 6 HOURS AS NEEDED FOR WHEEZING OR SHORTNESS OF BREATH 18 g 0   ALPRAZolam (XANAX) 0.25 MG tablet 1/2 to 1 by mouth twice a day as needed 60 tablet 0   aspirin EC 81 MG tablet Take 81 mg by mouth every evening.      Calcium Carbonate-Vitamin D (CALCIUM 600+D PO) Take 1 tablet by mouth every evening.       diltiazem (CARDIZEM CD) 120 MG 24 hr capsule Take 1 capsule by mouth once daily 90 capsule 0   flecainide (TAMBOCOR) 50 MG tablet Take 1 tablet (50 mg total) by mouth 2 (two) times daily. Please schedule appointment for future refills. Thank you 180 tablet 3   gabapentin (NEURONTIN) 300 MG capsule TAKE 3 TO 4 CAPSULES BY MOUTH AT BEDTIME 120 capsule 11   hydrALAZINE (APRESOLINE) 25 MG tablet Take one tablet (25 mg) by mouth every 6 hours as needed for a blood pressure >170 on the top number 90 tablet 0   losartan (COZAAR) 50 MG tablet Take 1 tablet (50 mg total) by mouth daily. PLEASE SCHEDULE OFFICE VISIT FOR FURTHER REFILLS. THANK YOU! 90 tablet 0   Multiple Vitamins-Minerals (CENTRUM ADULTS PO) Take 1 tablet by mouth daily.     sertraline (ZOLOFT) 50 MG tablet Take 1 tablet by mouth once daily 90 tablet 3   traZODone (DESYREL) 50 MG tablet TAKE 1 TO 2 TABLETS BY MOUTH AT BEDTIME 60 tablet 2   No current facility-administered medications on file prior to visit.    Allergies  Allergen Reactions   Cymbalta [Duloxetine Hcl] Nausea Only   Wellbutrin [Bupropion] Other (See Comments)    Elevated blood pressure, anxiety   Nicotine Palpitations    Nicotine Patches    Penicillins Rash    Has patient had a PCN reaction causing immediate rash, facial/tongue/throat swelling, SOB or lightheadedness with hypotension yes Has  patient had a PCN reaction causing severe rash involving mucus membranes or skin necrosis: no Has patient had a PCN reaction that required hospitalization no Has patient had a PCN reaction occurring within the last 10 years: no If all of the above answers are "NO", then may proceed with Cephalosporin use.    Past Medical History:  Diagnosis Date   A-fib [facility])    Depression    Hypertension    Neuropathy    idiopathic   Presence of permanent cardiac pacemaker     Past Surgical History:  Procedure Laterality Date   A-FLUTTER ABLATION N/A 02/04/2017   Procedure: A-Flutter  Ablation;  Surgeon: Deboraha Sprang, MD;  Location: [place] [facility];  Service: Cardiovascular;  Laterality: N/A;   BUNIONECTOMY Bilateral 1995   bilateral    CATARACT EXTRACTION W/ INTRAOCULAR LENS  IMPLANT, BILATERAL Bilateral    INSERT / REPLACE / REMOVE PACEMAKER  06/03/2017   PACEMAKER IMPLANT N/A 06/03/2017   Procedure: Pacemaker Implant;  Surgeon: Deboraha Sprang, MD;  Location: [place] [facility];  Service: Cardiovascular;  Laterality: N/A;   THROAT SURGERY  1980s   "nodules in my throat"   VAGINAL DELIVERY  1963   X 1   VAGINAL HYSTERECTOMY  1974    Family History  Problem Relation Age of Onset   Cancer Father        colon   Heart attack Brother    Diabetes Maternal Grandfather     Social History   Socioeconomic History   Marital status: Widowed    Spouse name: Not on file   Number of children: 1   Years of education: Not on file   Highest education level: Not on file  Occupational History   Occupation: Retired, [employer] Engineer, building services)  Tobacco Use   Smoking status: Every Day    Packs/day: 1.00    Years: 60.00    Total pack years: 60.00    Types: Cigarettes   Smokeless tobacco: Never  Vaping Use   Vaping Use: Never used  Substance and Sexual Activity   Alcohol use: No   Drug use: No   Sexual activity: Never  Other Topics Concern   Not on file  Social History Narrative   Husband died 08/18/23      Has living will   Has health care POA--- Essie Hart, friend   Plans to change this to her newly found son (that had been adopted)   Requests DNR--- form done 12/22/14   No tube feedings if cognitively unaware   Social Determinants of Health   Financial Resource Strain: Not on file  Food Insecurity: Not on file  Transportation Needs: Not on file  Physical Activity: Not on file  Stress: Not on file  Social Connections: Not on file  Intimate Partner Violence: Not on file   Review of Systems Still has some sleep problems---awakening but trouble getting  back Top implants---trouble with bottom denture. Needs new denture Wears seat belt No suspicious skin lesions No heartburn or dysphagia Bowels move fine---no blood No urinary problems--wears pad just in case No sig back or joint pains    Objective:   Physical Exam Constitutional:      Appearance: Normal appearance.  HENT:     Mouth/Throat:     Comments: No lesions Eyes:     Conjunctiva/sclera: Conjunctivae normal.     Pupils: Pupils are equal, round, and reactive to light.  Cardiovascular:     Rate and Rhythm: Normal rate  and regular rhythm.     Heart sounds: No murmur heard.    No gallop.     Comments: Feet warm--but no pulses Pulmonary:     Effort: Pulmonary effort is normal.     Breath sounds: Normal breath sounds. No wheezing or rales.     Comments: Does breathe with pursed lips Decreased breath sounds Abdominal:     Palpations: Abdomen is soft.     Tenderness: There is no abdominal tenderness.  Musculoskeletal:     Cervical back: Neck supple.     Right lower leg: No edema.     Left lower leg: No edema.  Lymphadenopathy:     Cervical: No cervical adenopathy.  Skin:    Findings: No lesion or rash.  Neurological:     General: No focal deficit present.     Mental Status: She is alert and oriented to person, place, and time.     Comments: Mini-cog normal  Psychiatric:        Mood and Affect: Mood normal.        Behavior: Behavior normal.            Assessment & Plan:

## 2022-09-15 NOTE — Assessment & Plan Note (Signed)
Regular on flecainide 50 bid and ASA 81

## 2022-09-15 NOTE — Assessment & Plan Note (Signed)
Clear symptoms that she minimizes No Rx for now--but again discussed cigarette cessation

## 2022-09-15 NOTE — Assessment & Plan Note (Signed)
Dysthymia and anxiety Good stress with son who she gave up finding her Continues on sertraline 50 daily and trazodone 75 for bedtime

## 2022-09-15 NOTE — Assessment & Plan Note (Signed)
On imaging Will hold off on statin 

## 2022-09-15 NOTE — Assessment & Plan Note (Signed)
BP Readings from Last 3 Encounters:  09/15/22 120/70  08/26/22 130/86  09/12/21 134/86   Good now Diltiazem 120mg  and losartan 50 daily

## 2022-09-15 NOTE — Assessment & Plan Note (Signed)
Still not eating well but at least her weight has stabilized

## 2022-09-15 NOTE — Assessment & Plan Note (Signed)
I have personally reviewed the Medicare Annual Wellness questionnaire and have noted 1. The patient's medical and social history 2. Their use of alcohol, tobacco or illicit drugs 3. Their current medications and supplements 4. The patient's functional ability including ADL's, fall risks, home safety risks and hearing or visual             impairment. 5. Diet and physical activities 6. Evidence for depression or mood disorders  The patients weight, height, BMI and visual acuity have been recorded in the chart I have made referrals, counseling and provided education to the patient based review of the above and I have provided the pt with a written personalized care plan for preventive services.  I have provided you with a copy of your personalized plan for preventive services. Please take the time to review along with your updated medication list.  Done with cancer screening Flu vaccine today Prefers no COVID Will hold off on RSV for now Discussed exercise Needs to stop smoking

## 2022-09-15 NOTE — Progress Notes (Signed)
Vision Screening   Right eye Left eye Both eyes  Without correction     With correction 20/20 20/25 20/25  Hearing Screening - Comments:: Passed whisper test  

## 2022-09-15 NOTE — Assessment & Plan Note (Signed)
Vague foot symptoms---temperature and some pain with walking Discussed stopping the cigarettes

## 2022-09-16 LAB — CBC
HCT: 46.2 % — ABNORMAL HIGH (ref 36.0–46.0)
Hemoglobin: 15.7 g/dL — ABNORMAL HIGH (ref 12.0–15.0)
MCHC: 33.9 g/dL (ref 30.0–36.0)
MCV: 99.8 fl (ref 78.0–100.0)
Platelets: 151 10*3/uL (ref 150.0–400.0)
RBC: 4.63 Mil/uL (ref 3.87–5.11)
RDW: 13.3 % (ref 11.5–15.5)
WBC: 5.6 10*3/uL (ref 4.0–10.5)

## 2022-09-16 LAB — LIPID PANEL
Cholesterol: 171 mg/dL (ref 0–200)
HDL: 67.7 mg/dL (ref >39.00)
LDL Cholesterol: 80 mg/dL (ref 0–99)
NonHDL: 103.25
Total CHOL/HDL Ratio: 3
Triglycerides: 114 mg/dL (ref 0.0–149.0)
VLDL: 22.8 mg/dL (ref 0.0–40.0)

## 2022-09-16 LAB — COMPREHENSIVE METABOLIC PANEL
ALT: 9 U/L (ref 0–35)
AST: 15 U/L (ref 0–37)
Albumin: 4 g/dL (ref 3.5–5.2)
Alkaline Phosphatase: 85 U/L (ref 39–117)
BUN: 16 mg/dL (ref 6–23)
CO2: 33 mEq/L — ABNORMAL HIGH (ref 19–32)
Calcium: 9.1 mg/dL (ref 8.4–10.5)
Chloride: 103 mEq/L (ref 96–112)
Creatinine, Ser: 0.75 mg/dL (ref 0.40–1.20)
GFR: 75.25 mL/min (ref >60.00)
Glucose, Bld: 130 mg/dL — ABNORMAL HIGH (ref 70–99)
Potassium: 3.9 mEq/L (ref 3.5–5.1)
Sodium: 141 mEq/L (ref 135–145)
Total Bilirubin: 0.3 mg/dL (ref 0.2–1.2)
Total Protein: 6.2 g/dL (ref 6.0–8.3)

## 2022-09-16 LAB — TSH: TSH: 1.07 u[IU]/mL (ref 0.35–5.50)

## 2022-09-22 ENCOUNTER — Encounter: Payer: Self-pay | Admitting: Internal Medicine

## 2022-09-23 MED ORDER — HYDROCODONE-ACETAMINOPHEN 5-325 MG PO TABS: 0.5000 | ORAL_TABLET | ORAL | 0 refills | Status: DC | PRN

## 2022-09-26 ENCOUNTER — Other Ambulatory Visit: Payer: Self-pay | Admitting: Internal Medicine

## 2022-10-09 ENCOUNTER — Ambulatory Visit (INDEPENDENT_AMBULATORY_CARE_PROVIDER_SITE_OTHER): Payer: Medicare Other

## 2022-10-09 DIAGNOSIS — I495 Sick sinus syndrome: Secondary | ICD-10-CM | POA: Diagnosis not present

## 2022-10-10 LAB — CUP PACEART REMOTE DEVICE CHECK
Battery Remaining Longevity: 93 mo
Battery Voltage: 3 V
Brady Statistic AP VP Percent: 0.04 %
Brady Statistic AP VS Percent: 86.12 %
Brady Statistic AS VP Percent: 0.03 %
Brady Statistic AS VS Percent: 13.82 %
Brady Statistic RA Percent Paced: 86.14 %
Brady Statistic RV Percent Paced: 0.06 %
Date Time Interrogation Session: 20231215002735
Implantable Lead Connection Status: 753985
Implantable Lead Connection Status: 753985
Implantable Lead Implant Date: 20180808
Implantable Lead Implant Date: 20180808
Implantable Lead Location: 753859
Implantable Lead Location: 753860
Implantable Lead Model: 3830
Implantable Lead Model: 5076
Implantable Pulse Generator Implant Date: 20180808
Lead Channel Impedance Value: 266 Ohm
Lead Channel Impedance Value: 342 Ohm
Lead Channel Impedance Value: 399 Ohm
Lead Channel Impedance Value: 532 Ohm
Lead Channel Pacing Threshold Amplitude: 0.75 V
Lead Channel Pacing Threshold Amplitude: 0.875 V
Lead Channel Pacing Threshold Pulse Width: 0.4 ms
Lead Channel Pacing Threshold Pulse Width: 0.4 ms
Lead Channel Sensing Intrinsic Amplitude: 2.125 mV
Lead Channel Sensing Intrinsic Amplitude: 2.125 mV
Lead Channel Sensing Intrinsic Amplitude: 7 mV
Lead Channel Sensing Intrinsic Amplitude: 7 mV
Lead Channel Setting Pacing Amplitude: 2 V
Lead Channel Setting Pacing Amplitude: 2.5 V
Lead Channel Setting Pacing Pulse Width: 0.4 ms
Lead Channel Setting Sensing Sensitivity: 0.9 mV
Zone Setting Status: 755011
Zone Setting Status: 755011

## 2022-10-24 ENCOUNTER — Other Ambulatory Visit: Payer: Self-pay | Admitting: Internal Medicine

## 2022-10-30 NOTE — Progress Notes (Signed)
Remote pacemaker transmission.   

## 2022-11-08 ENCOUNTER — Other Ambulatory Visit: Payer: Self-pay | Admitting: Internal Medicine

## 2022-11-10 ENCOUNTER — Other Ambulatory Visit: Payer: Self-pay | Admitting: Internal Medicine

## 2022-12-27 ENCOUNTER — Other Ambulatory Visit: Payer: Self-pay | Admitting: Internal Medicine

## 2023-01-06 ENCOUNTER — Other Ambulatory Visit: Payer: Self-pay | Admitting: Internal Medicine

## 2023-01-08 ENCOUNTER — Ambulatory Visit: Payer: Medicare Other

## 2023-01-08 DIAGNOSIS — I495 Sick sinus syndrome: Secondary | ICD-10-CM | POA: Diagnosis not present

## 2023-01-09 LAB — CUP PACEART REMOTE DEVICE CHECK
Battery Remaining Longevity: 91 mo
Battery Voltage: 2.99 V
Brady Statistic AP VP Percent: 0.04 %
Brady Statistic AP VS Percent: 84.55 %
Brady Statistic AS VP Percent: 0.03 %
Brady Statistic AS VS Percent: 15.39 %
Brady Statistic RA Percent Paced: 84.62 %
Brady Statistic RV Percent Paced: 0.07 %
Date Time Interrogation Session: 20240314004421
Implantable Lead Connection Status: 753985
Implantable Lead Connection Status: 753985
Implantable Lead Implant Date: 20180808
Implantable Lead Implant Date: 20180808
Implantable Lead Location: 753859
Implantable Lead Location: 753860
Implantable Lead Model: 3830
Implantable Lead Model: 5076
Implantable Pulse Generator Implant Date: 20180808
Lead Channel Impedance Value: 266 Ohm
Lead Channel Impedance Value: 342 Ohm
Lead Channel Impedance Value: 380 Ohm
Lead Channel Impedance Value: 513 Ohm
Lead Channel Pacing Threshold Amplitude: 0.875 V
Lead Channel Pacing Threshold Amplitude: 1 V
Lead Channel Pacing Threshold Pulse Width: 0.4 ms
Lead Channel Pacing Threshold Pulse Width: 0.4 ms
Lead Channel Sensing Intrinsic Amplitude: 3 mV
Lead Channel Sensing Intrinsic Amplitude: 3 mV
Lead Channel Sensing Intrinsic Amplitude: 5.25 mV
Lead Channel Sensing Intrinsic Amplitude: 5.25 mV
Lead Channel Setting Pacing Amplitude: 2 V
Lead Channel Setting Pacing Amplitude: 2.5 V
Lead Channel Setting Pacing Pulse Width: 0.4 ms
Lead Channel Setting Sensing Sensitivity: 0.9 mV
Zone Setting Status: 755011
Zone Setting Status: 755011

## 2023-01-29 ENCOUNTER — Other Ambulatory Visit: Payer: Self-pay | Admitting: Internal Medicine

## 2023-02-10 NOTE — Progress Notes (Signed)
Remote pacemaker transmission.   

## 2023-02-26 ENCOUNTER — Other Ambulatory Visit: Payer: Self-pay | Admitting: Internal Medicine

## 2023-04-09 ENCOUNTER — Ambulatory Visit (INDEPENDENT_AMBULATORY_CARE_PROVIDER_SITE_OTHER): Payer: Medicare Other

## 2023-04-09 DIAGNOSIS — I495 Sick sinus syndrome: Secondary | ICD-10-CM

## 2023-04-09 LAB — CUP PACEART REMOTE DEVICE CHECK
Battery Remaining Longevity: 88 mo
Battery Voltage: 2.99 V
Brady Statistic AP VP Percent: 0.04 %
Brady Statistic AP VS Percent: 95.96 %
Brady Statistic AS VP Percent: 0.01 %
Brady Statistic AS VS Percent: 3.99 %
Brady Statistic RA Percent Paced: 95.98 %
Brady Statistic RV Percent Paced: 0.05 %
Date Time Interrogation Session: 20240613004611
Implantable Lead Connection Status: 753985
Implantable Lead Connection Status: 753985
Implantable Lead Implant Date: 20180808
Implantable Lead Implant Date: 20180808
Implantable Lead Location: 753859
Implantable Lead Location: 753860
Implantable Lead Model: 3830
Implantable Lead Model: 5076
Implantable Pulse Generator Implant Date: 20180808
Lead Channel Impedance Value: 266 Ohm
Lead Channel Impedance Value: 342 Ohm
Lead Channel Impedance Value: 380 Ohm
Lead Channel Impedance Value: 513 Ohm
Lead Channel Pacing Threshold Amplitude: 0.75 V
Lead Channel Pacing Threshold Amplitude: 1 V
Lead Channel Pacing Threshold Pulse Width: 0.4 ms
Lead Channel Pacing Threshold Pulse Width: 0.4 ms
Lead Channel Sensing Intrinsic Amplitude: 2.125 mV
Lead Channel Sensing Intrinsic Amplitude: 2.125 mV
Lead Channel Sensing Intrinsic Amplitude: 3.875 mV
Lead Channel Sensing Intrinsic Amplitude: 3.875 mV
Lead Channel Setting Pacing Amplitude: 2 V
Lead Channel Setting Pacing Amplitude: 2.5 V
Lead Channel Setting Pacing Pulse Width: 0.4 ms
Lead Channel Setting Sensing Sensitivity: 0.9 mV
Zone Setting Status: 755011
Zone Setting Status: 755011

## 2023-04-10 ENCOUNTER — Other Ambulatory Visit: Payer: Self-pay | Admitting: Internal Medicine

## 2023-04-29 NOTE — Progress Notes (Signed)
Remote pacemaker transmission.   

## 2023-05-27 ENCOUNTER — Other Ambulatory Visit: Payer: Self-pay | Admitting: Internal Medicine

## 2023-07-02 ENCOUNTER — Other Ambulatory Visit: Payer: Self-pay | Admitting: Internal Medicine

## 2023-07-09 ENCOUNTER — Ambulatory Visit (INDEPENDENT_AMBULATORY_CARE_PROVIDER_SITE_OTHER): Payer: Medicare Other

## 2023-07-09 DIAGNOSIS — I495 Sick sinus syndrome: Secondary | ICD-10-CM

## 2023-07-09 LAB — CUP PACEART REMOTE DEVICE CHECK
Battery Remaining Longevity: 85 mo
Battery Voltage: 2.98 V
Brady Statistic AP VP Percent: 0.06 %
Brady Statistic AP VS Percent: 97.11 %
Brady Statistic AS VP Percent: 0.01 %
Brady Statistic AS VS Percent: 2.82 %
Brady Statistic RA Percent Paced: 97.17 %
Brady Statistic RV Percent Paced: 0.07 %
Date Time Interrogation Session: 20240912004552
Implantable Lead Connection Status: 753985
Implantable Lead Connection Status: 753985
Implantable Lead Implant Date: 20180808
Implantable Lead Implant Date: 20180808
Implantable Lead Location: 753859
Implantable Lead Location: 753860
Implantable Lead Model: 3830
Implantable Lead Model: 5076
Implantable Pulse Generator Implant Date: 20180808
Lead Channel Impedance Value: 266 Ohm
Lead Channel Impedance Value: 342 Ohm
Lead Channel Impedance Value: 380 Ohm
Lead Channel Impedance Value: 513 Ohm
Lead Channel Pacing Threshold Amplitude: 1 V
Lead Channel Pacing Threshold Amplitude: 1 V
Lead Channel Pacing Threshold Pulse Width: 0.4 ms
Lead Channel Pacing Threshold Pulse Width: 0.4 ms
Lead Channel Sensing Intrinsic Amplitude: 1.75 mV
Lead Channel Sensing Intrinsic Amplitude: 1.75 mV
Lead Channel Sensing Intrinsic Amplitude: 6.25 mV
Lead Channel Sensing Intrinsic Amplitude: 6.25 mV
Lead Channel Setting Pacing Amplitude: 2 V
Lead Channel Setting Pacing Amplitude: 2.5 V
Lead Channel Setting Pacing Pulse Width: 0.4 ms
Lead Channel Setting Sensing Sensitivity: 0.9 mV
Zone Setting Status: 755011
Zone Setting Status: 755011

## 2023-07-17 ENCOUNTER — Other Ambulatory Visit: Payer: Self-pay | Admitting: Internal Medicine

## 2023-07-17 NOTE — Progress Notes (Signed)
Remote pacemaker transmission.

## 2023-09-03 ENCOUNTER — Encounter: Payer: Self-pay | Admitting: Cardiology

## 2023-09-03 ENCOUNTER — Ambulatory Visit: Payer: Medicare Other | Attending: Cardiology | Admitting: Cardiology

## 2023-09-03 VITALS — BP 128/78 | HR 79 | Ht 62.0 in | Wt 113.0 lb

## 2023-09-03 DIAGNOSIS — I480 Paroxysmal atrial fibrillation: Secondary | ICD-10-CM | POA: Diagnosis not present

## 2023-09-03 DIAGNOSIS — I10 Essential (primary) hypertension: Secondary | ICD-10-CM

## 2023-09-03 DIAGNOSIS — I483 Typical atrial flutter: Secondary | ICD-10-CM | POA: Diagnosis not present

## 2023-09-03 DIAGNOSIS — I495 Sick sinus syndrome: Secondary | ICD-10-CM

## 2023-09-03 DIAGNOSIS — Z950 Presence of cardiac pacemaker: Secondary | ICD-10-CM

## 2023-09-03 LAB — CUP PACEART INCLINIC DEVICE CHECK
Date Time Interrogation Session: 20241107143445
Implantable Lead Connection Status: 753985
Implantable Lead Connection Status: 753985
Implantable Lead Implant Date: 20180808
Implantable Lead Implant Date: 20180808
Implantable Lead Location: 753859
Implantable Lead Location: 753860
Implantable Lead Model: 3830
Implantable Lead Model: 5076
Implantable Pulse Generator Implant Date: 20180808

## 2023-09-03 NOTE — Progress Notes (Signed)
Cardiology Office Note Date:  09/03/2023  Patient ID:  Heidi Carroll, Heidi Carroll 05/10/1941, MRN [id number] PCP:  Karie Schwalbe, MD  Cardiologist:  None Electrophysiologist: Sherryl Manges, MD     Chief Complaint: 1 year device follow-up  History of Present Illness: Heidi Carroll is a 82 y.o. female with PMH notable for atrial flutter, atrial tach, SND s/p PPM, PVCs; seen today for Sherryl Manges, MD for routine electrophysiology followup.  She is s/p aflutter ablation 01/2017 Her atrial tach and PVCs have been managed with flecainide.  She last saw Dr. Graciela Husbands 10/223 and was doing well. SCAF by device so not on OAC.  On follow-up today, she is slightly SOB with walking from parking lot to the clinic, but this is stable and not worsening. She continues to smoke cigarettes.  She checks BP daily, most readings 120-140s systolic. She has never taken her PRN hydral because BP has never been 170 systolics.  She denies chest pain, chest pressure. No palpitations. She has difficulty cooking for one at home, so eats a lot of banana & PB sandwiches.     Device Information: MDT dual chamber PPM, imp 05/2017; dx SND  AAD History: Flecainide   Past Medical History:  Diagnosis Date   A-fib ([facility])    Depression    Hypertension    Neuropathy    idiopathic   Presence of permanent cardiac pacemaker     Past Surgical History:  Procedure Laterality Date   A-FLUTTER ABLATION N/A 02/04/2017   Procedure: A-Flutter Ablation;  Surgeon: Duke Salvia, MD;  Location: [facility];  Service: Cardiovascular;  Laterality: N/A;   BUNIONECTOMY Bilateral 1995   bilateral    CATARACT EXTRACTION W/ INTRAOCULAR LENS  IMPLANT, BILATERAL Bilateral    INSERT / REPLACE / REMOVE PACEMAKER  06/03/2017   PACEMAKER IMPLANT N/A 06/03/2017   Procedure: Pacemaker Implant;  Surgeon: Duke Salvia, MD;  [facility]: [facility];  Service: Cardiovascular;  Laterality: N/A;   THROAT SURGERY  1980s   "nodules in  my throat"   VAGINAL DELIVERY  1963   X 1   VAGINAL HYSTERECTOMY  1974    Current Outpatient Medications  Medication Instructions   acetaminophen (TYLENOL) 500 mg, Oral, Daily PRN   albuterol (VENTOLIN HFA) 108 (90 Base) MCG/ACT inhaler INHALE 2 PUFFS BY MOUTH EVERY 6 HOURS AS NEEDED FOR WHEEZING FOR SHORTNESS OF BREATH   ALPRAZolam (XANAX) 0.25 MG tablet 1/2 to 1 by mouth twice a day as needed   aspirin EC 81 mg, Oral, Every evening   Calcium Carbonate-Vitamin D (CALCIUM 600+D PO) 1 tablet, Oral, Every evening   diltiazem (CARDIZEM CD) 120 mg, Oral, Daily   flecainide (TAMBOCOR) 50 mg, Oral, 2 times daily, Please schedule appointment for future refills. Thank you   gabapentin (NEURONTIN) 300 MG capsule TAKE 3 TO 4 CAPSULES BY MOUTH AT BEDTIME   hydrALAZINE (APRESOLINE) 25 MG tablet Take one tablet (25 mg) by mouth every 6 hours as needed for a blood pressure >170 on the top number   HYDROcodone-acetaminophen (NORCO/VICODIN) 5-325 MG tablet 0.5-1 tablets, Oral, Every 4 hours PRN   losartan (COZAAR) 50 mg, Oral, Daily   Multiple Vitamins-Minerals (CENTRUM ADULTS PO) 1 tablet, Oral, Daily   sertraline (ZOLOFT) 50 mg, Oral, Daily   traZODone (DESYREL) 50 MG tablet TAKE 1 TO 2 TABLETS BY MOUTH AT BEDTIME    Social History:  The patient  reports that she has been smoking cigarettes. She has  a 60 pack-year smoking history. She has never used smokeless tobacco. She reports that she does not drink alcohol and does not use drugs.   Family History:  The patient's family history includes Cancer in her father; Diabetes in her maternal grandfather; Heart attack in her brother.  ROS:  Please see the history of present illness. All other systems are reviewed and otherwise negative.   PHYSICAL EXAM:  VS:  BP 128/78 (BP Location: Left Arm, Patient Position: Sitting, Cuff Size: Normal)   Pulse 79   Ht 5\' 2"  (1.575 m)   Wt 113 lb (51.3 kg)   SpO2 93%   BMI 20.67 kg/m  BMI: Body mass index is 20.67  kg/m.  GEN- The patient is frail, thin appearing, alert and oriented x 3 today.   Lungs- Clear to ausculation bilaterally, slight increased work of breathing, barrel-chested with pectus carinatum.  Heart- Regular rate and rhythm, no murmurs, rubs or gallops Extremities- No peripheral edema, warm, dry Skin-  device pocket well-healed, no tethering   Device interrogation done today and reviewed by myself:  Battery 7 years Lead thresholds, impedence, sensing stable  Single, brief AFib episode Brief AT episodes Brief NSVT episode, less than 2 seconds No changes made today  EKG is ordered. Personal review of EKG from today shows:    EKG Interpretation Date/Time:  Thursday September 03 2023 13:56:00 EST Ventricular Rate:  79 PR Interval:  218 QRS Duration:  86 QT Interval:  388 QTC Calculation: 444 R Axis:   -42  Text Interpretation: Atrial-paced rhythm with prolonged AV conduction Left axis deviation Confirmed by Sherie Don [id number]) on 09/03/2023 2:30:42 PM    08/26/2022: AP at 82bpm PR QRS 90  Recent Labs: 09/15/2022: ALT 9; BUN 16; Creatinine, Ser 0.75; Hemoglobin 15.7; Platelets 151.0; Potassium 3.9; Sodium 141; TSH 1.07  09/15/2022: Cholesterol 171; HDL 67.70; LDL Cholesterol 80; Total CHOL/HDL Ratio 3; Triglycerides 114.0; VLDL 22.8   CrCl cannot be calculated (Patient's most recent lab result is older than the maximum 21 days allowed.).   Wt Readings from Last 3 Encounters:  09/03/23 113 lb (51.3 kg)  09/15/22 124 lb (56.2 kg)  08/26/22 124 lb (56.2 kg)     Additional studies reviewed include: Previous EP, cardiology notes.   TTE, 11/12/2016 - Left ventricle: The cavity size was normal. Wall thickness was normal. Systolic function was normal. The estimated ejection fraction was in the range of 55% to 65%.  - Aortic valve: There was mild regurgitation. Valve area (Vmax): 2.09 cm^2.  - Mitral valve: There was mild regurgitation.  - Right atrium: The atrium  was mildly dilated.        ASSESSMENT AND PLAN:  #) aflutter #) afib #) flecainide monitoring S/p aflutter ablation 2018 SCAF by device EKG with stable intervals Continue flecainide 50mg  BID Continue dilt 120mg  daily   #) SND s/p PPM Device functioning well, see paceart for details Low VP   #) HTN Well-controlled on losartan 50mg  daily and dilt Recommend to continued checking BP 2-3 times a week and record measurements Bring BP log to follow-up appts        Current medicines are reviewed at length with the patient today.   The patient does not have concerns regarding her medicines.  The following changes were made today:  none  Labs/ tests ordered today include:  Orders Placed This Encounter  Procedures   EKG 12-Lead     Disposition: Follow up with Dr. Graciela Husbands or EP APP in  12 months   Signed, Sherie Don, NP  09/03/23  2:30 PM  Electrophysiology [facility]

## 2023-09-03 NOTE — Patient Instructions (Signed)
Medication Instructions:  The current medical regimen is effective;  continue present plan and medications.  *If you need a refill on your cardiac medications before your next appointment, please call your pharmacy*   Follow-Up: At [facility], you and your health needs are our priority.  As part of our continuing mission to provide you with exceptional heart care, we have created designated Provider Care Teams.  These Care Teams include your primary Cardiologist (physician) and Advanced Practice Providers (APPs -  Physician Assistants and Nurse Practitioners) who all work together to provide you with the care you need, when you need it.  We recommend signing up for the patient portal called "MyChart".  Sign up information is provided on this After Visit Summary.  MyChart is used to connect with patients for Virtual Visits (Telemedicine).  Patients are able to view lab/test results, encounter notes, upcoming appointments, etc.  Non-urgent messages can be sent to your provider as well.   To learn more about what you can do with MyChart, go to [email or web address].    Your next appointment:   12 month(s)  Provider:   Sherie Don, NP

## 2023-09-21 ENCOUNTER — Encounter: Payer: Medicare Other | Admitting: Internal Medicine

## 2023-10-01 ENCOUNTER — Encounter: Payer: Medicare Other | Admitting: Internal Medicine

## 2023-10-02 ENCOUNTER — Ambulatory Visit: Payer: Medicare Other | Admitting: Internal Medicine

## 2023-10-02 ENCOUNTER — Encounter: Payer: Self-pay | Admitting: Internal Medicine

## 2023-10-02 VITALS — BP 132/80 | HR 79 | Temp 98.1°F | Ht 61.5 in | Wt 114.0 lb

## 2023-10-02 DIAGNOSIS — Z23 Encounter for immunization: Secondary | ICD-10-CM

## 2023-10-02 DIAGNOSIS — F39 Unspecified mood [affective] disorder: Secondary | ICD-10-CM

## 2023-10-02 DIAGNOSIS — I480 Paroxysmal atrial fibrillation: Secondary | ICD-10-CM | POA: Diagnosis not present

## 2023-10-02 DIAGNOSIS — J449 Chronic obstructive pulmonary disease, unspecified: Secondary | ICD-10-CM | POA: Diagnosis not present

## 2023-10-02 DIAGNOSIS — Z0000 Encounter for general adult medical examination without abnormal findings: Secondary | ICD-10-CM | POA: Diagnosis not present

## 2023-10-02 DIAGNOSIS — I495 Sick sinus syndrome: Secondary | ICD-10-CM | POA: Diagnosis not present

## 2023-10-02 LAB — COMPREHENSIVE METABOLIC PANEL
ALT: 11 U/L (ref 0–35)
AST: 17 U/L (ref 0–37)
Albumin: 4.2 g/dL (ref 3.5–5.2)
Alkaline Phosphatase: 95 U/L (ref 39–117)
BUN: 18 mg/dL (ref 6–23)
CO2: 34 meq/L — ABNORMAL HIGH (ref 19–32)
Calcium: 9.6 mg/dL (ref 8.4–10.5)
Chloride: 99 meq/L (ref 96–112)
Creatinine, Ser: 0.63 mg/dL (ref 0.40–1.20)
GFR: 83.23 mL/min (ref >60.00)
Glucose, Bld: 76 mg/dL (ref 70–99)
Potassium: 5.1 meq/L (ref 3.5–5.1)
Sodium: 140 meq/L (ref 135–145)
Total Bilirubin: 0.6 mg/dL (ref 0.2–1.2)
Total Protein: 6.9 g/dL (ref 6.0–8.3)

## 2023-10-02 LAB — LIPID PANEL
Cholesterol: 174 mg/dL (ref 0–200)
HDL: 69.7 mg/dL (ref >39.00)
LDL Cholesterol: 90 mg/dL (ref 0–99)
NonHDL: 104.21
Total CHOL/HDL Ratio: 2
Triglycerides: 73 mg/dL (ref 0.0–149.0)
VLDL: 14.6 mg/dL (ref 0.0–40.0)

## 2023-10-02 LAB — CBC
HCT: 48.8 % — ABNORMAL HIGH (ref 36.0–46.0)
Hemoglobin: 17.2 g/dL — ABNORMAL HIGH (ref 12.0–15.0)
MCHC: 35.3 g/dL (ref 30.0–36.0)
MCV: 101.4 fL — ABNORMAL HIGH (ref 78.0–100.0)
Platelets: 199 10*3/uL (ref 150.0–400.0)
RBC: 4.81 Mil/uL (ref 3.87–5.11)
RDW: 13 % (ref 11.5–15.5)
WBC: 6.6 10*3/uL (ref 4.0–10.5)

## 2023-10-02 LAB — TSH: TSH: 0.67 u[IU]/mL (ref 0.35–5.50)

## 2023-10-02 NOTE — Progress Notes (Signed)
Subjective:    Patient ID: Heidi Carroll, female    DOB: 12-16-1940, 82 y.o.   MRN: [id number]  HPI Here for Medicare wellness visit and follow up of chronic health conditions Reviewed advanced directives Reviewed other doctors----Dr Klein/Ms Riddle--cardiology, Dr Denyse Amass No hospitalizations or surgery in the past year Does walk a lot--especially at her volunteer work Vision is okay Hearing is fine Still smokes No alcohol No falls Chronic mood issues--are better Independent with instrumental ADLs No sig memory issues  Has ongoing back pain Has to do everything at the house--and anything heavy causes her pain (like lifting bag of dog food) Uses tylenol prn---will help  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 Has been down to visit. One of her grandchildren is getting married. (Though gets stressed leaving home)  Heart is okay Rare palpitations--like a flutter. Doesn't race . Pacer set from [age] and this may have helped No chest pain Still gets SOB--stable though No regular cough--still smoking (has cut back) No edema Sleeps flat in bed--no PND  Mood is okay Real positive thing finding her son, etc. Enjoys the volunteering--but it is also aggravating  Current Outpatient Medications on File Prior to Visit  Medication Sig Dispense Refill   acetaminophen (TYLENOL) 500 MG tablet Take 500 mg by mouth daily as needed for moderate pain or headache.     albuterol (VENTOLIN HFA) 108 (90 Base) MCG/ACT inhaler INHALE 2 PUFFS BY MOUTH EVERY 6 HOURS AS NEEDED FOR WHEEZING FOR SHORTNESS OF BREATH 9 g 3   ALPRAZolam (XANAX) 0.25 MG tablet 1/2 to 1 by mouth twice a day as needed 60 tablet 0   aspirin EC 81 MG tablet Take 81 mg by mouth every evening.      Calcium Carbonate-Vitamin D (CALCIUM 600+D PO) Take 1 tablet by mouth every evening.      diltiazem (CARDIZEM CD) 120 MG 24 hr capsule Take 1 capsule  (120 mg total) by mouth daily. 90 capsule 3   flecainide (TAMBOCOR) 50 MG tablet Take 1 tablet (50 mg total) by mouth 2 (two) times daily. Please schedule appointment for future refills. Thank you 180 tablet 3   gabapentin (NEURONTIN) 300 MG capsule TAKE 3 TO 4 CAPSULES BY MOUTH AT BEDTIME 120 capsule 5   hydrALAZINE (APRESOLINE) 25 MG tablet Take one tablet (25 mg) by mouth every 6 hours as needed for a blood pressure >170 on the top number 90 tablet 0   losartan (COZAAR) 50 MG tablet Take 1 tablet by mouth once daily 90 tablet 0   Multiple Vitamins-Minerals (CENTRUM ADULTS PO) Take 1 tablet by mouth daily.     sertraline (ZOLOFT) 50 MG tablet Take 1 tablet (50 mg total) by mouth daily. 90 tablet 3   traZODone (DESYREL) 50 MG tablet TAKE 1 TO 2 TABLETS BY MOUTH AT BEDTIME 180 tablet 3   No current facility-administered medications on file prior to visit.    Allergies  Allergen Reactions   Cymbalta [Duloxetine Hcl] Nausea Only   Wellbutrin [Bupropion] Other (See Comments)    Elevated blood pressure, anxiety   Nicotine Palpitations    Nicotine Patches    Penicillins Rash    Has patient had a PCN reaction causing immediate rash, facial/tongue/throat swelling, SOB or lightheadedness with hypotension yes Has patient had a PCN reaction causing severe rash involving mucus membranes or skin necrosis: no Has patient had a PCN reaction that required hospitalization no  Has patient had a PCN reaction occurring within the last 10 years: no If all of the above answers are "NO", then may proceed with Cephalosporin use.    Past Medical History:  Diagnosis Date   A-fib [facility])    Depression    Hypertension    Neuropathy    idiopathic   Presence of permanent cardiac pacemaker     Past Surgical History:  Procedure Laterality Date   A-FLUTTER ABLATION N/A 02/04/2017   Procedure: A-Flutter Ablation;  Surgeon: Duke Salvia, MD;  Location: [facility] INVASIVE CV LAB;  Service: Cardiovascular;  Laterality:  N/A;   BUNIONECTOMY Bilateral 1995   bilateral    CATARACT EXTRACTION W/ INTRAOCULAR LENS  IMPLANT, BILATERAL Bilateral    INSERT / REPLACE / REMOVE PACEMAKER  06/03/2017   PACEMAKER IMPLANT N/A 06/03/2017   Procedure: Pacemaker Implant;  Surgeon: Duke Salvia, MD;  Location: [facility] INVASIVE CV LAB;  Service: Cardiovascular;  Laterality: N/A;   THROAT SURGERY  1980s   "nodules in my throat"   VAGINAL DELIVERY  1963   X 1   VAGINAL HYSTERECTOMY  1974    Family History  Problem Relation Age of Onset   Cancer Father        colon   Heart attack Brother    Diabetes Maternal Grandfather     Social History   Socioeconomic History   Marital status: Widowed    Spouse name: Not on file   Number of children: 1   Years of education: Not on file   Highest education level: Some college, no degree  Occupational History   Occupation: Retired, [employer]selling psychologist)  Tobacco Use   Smoking status: Every Day    Current packs/day: 1.00    Average packs/day: 1 pack/day for 60.0 years (60.0 ttl pk-yrs)    Types: Cigarettes   Smokeless tobacco: Never  Vaping Use   Vaping status: Never Used  Substance and Sexual Activity   Alcohol use: No   Drug use: No   Sexual activity: Never  Other Topics Concern   Not on file  Social History Narrative   Husband died 08/28/2023      Has living will   Has health care POA--- Chaya Jan, friend   Considering change to her newly found son (that had been adopted)   Requests DNR--- form done 12/22/14   No tube feedings if cognitively unaware   Social Determinants of Health   Financial Resource Strain: Low Risk  (10/01/2023)   Overall Financial Resource Strain (CARDIA)    Difficulty of Paying Living Expenses: Not hard at all  Food Insecurity: No Food Insecurity (10/01/2023)   Hunger Vital Sign    Worried About Running Out of Food in the Last Year: Never true    Ran Out of Food in the Last Year: Never true  Transportation Needs: No Transportation Needs  (10/01/2023)   PRAPARE - Administrator, Civil Service (Medical): No    Lack of Transportation (Non-Medical): No  Physical Activity: Sufficiently Active (10/01/2023)   Exercise Vital Sign    Days of Exercise per Week: 5 days    Minutes of Exercise per Session: 60 min  Stress: No Stress Concern Present (10/01/2023)   [employer] of Occupational Health - Occupational Stress Questionnaire    Feeling of Stress : Not at all  Social Connections: Socially Isolated (10/01/2023)   Social Connection and Isolation Panel [NHANES]    Frequency of Communication with Friends and Family: More than  three times a week    Frequency of Social Gatherings with Friends and Family: Twice a week    Attends Religious Services: Never    Database administrator or Organizations: No    Attends Engineer, structural: Not on file    Marital Status: Widowed  Catering manager Violence: Not on file   Review of Systems Appetite still not good Weight has stabilized--has new Risk manager she really likes Sleeps okay with the trazodone Wears seat belt Teeth okay--full upper and lower No heartburn or dysphagia Bowels move better now. No blood Voids okay--nocturia x 1 usually. Slight leakage--wears pad just in case No other joint pains No suspicious skin lesions    Objective:   Physical Exam Constitutional:      Appearance: Normal appearance.  HENT:     Mouth/Throat:     Pharynx: No oropharyngeal exudate or posterior oropharyngeal erythema.  Eyes:     Conjunctiva/sclera: Conjunctivae normal.     Pupils: Pupils are equal, round, and reactive to light.  Cardiovascular:     Rate and Rhythm: Normal rate and regular rhythm.     Heart sounds: No murmur heard.    No gallop.     Comments: Faint pedal pulses Pulmonary:     Effort: Pulmonary effort is normal.     Breath sounds: No wheezing or rales.     Comments: Decreased breath sounds but clear Abdominal:     Palpations: Abdomen is soft.      Tenderness: There is no abdominal tenderness.  Musculoskeletal:     Cervical back: Neck supple.     Right lower leg: No edema.     Left lower leg: No edema.  Lymphadenopathy:     Cervical: No cervical adenopathy.  Skin:    Findings: No rash.  Neurological:     General: No focal deficit present.     Mental Status: She is alert and oriented to person, place, and time.     Comments: Word naming 14/45 seconds Recall 3/3  Psychiatric:        Mood and Affect: Mood normal.        Behavior: Behavior normal.            Assessment & Plan:

## 2023-10-02 NOTE — Progress Notes (Signed)
Hearing Screening - Comments:: Passed whisper test Vision Screening - Comments:: December 2023. Has appointment next week.

## 2023-10-02 NOTE — Assessment & Plan Note (Signed)
Still smokes Stable DOE Has the albuterol for prn use

## 2023-10-02 NOTE — Assessment & Plan Note (Signed)
And atrial flutter Controlled on flecanide ASA 81 and diltiazem 120 daily

## 2023-10-02 NOTE — Assessment & Plan Note (Signed)
Pacemaker rate adjusted recently

## 2023-10-02 NOTE — Assessment & Plan Note (Signed)
Doing better since found son she had given up for adoption Still on the low dose sertraline 50---and uses trazodone for sleep

## 2023-10-02 NOTE — Assessment & Plan Note (Signed)
I have personally reviewed the Medicare Annual Wellness questionnaire and have noted 1. The patient's medical and social history 2. Their use of alcohol, tobacco or illicit drugs 3. Their current medications and supplements 4. The patient's functional ability including ADL's, fall risks, home safety risks and hearing or visual             impairment. 5. Diet and physical activities 6. Evidence for depression or mood disorders  The patients weight, height, BMI and visual acuity have been recorded in the chart I have made referrals, counseling and provided education to the patient based review of the above and I have provided the pt with a written personalized care plan for preventive services.  I have provided you with a copy of your personalized plan for preventive services. Please take the time to review along with your updated medication list.  Done with cancer screening due to age Flu vaccine today Still prefers no COVID vaccine Recommended one time RSV at pharmacy [facility] a lot with volunteer work

## 2023-10-08 ENCOUNTER — Ambulatory Visit: Payer: Medicare Other

## 2023-10-08 ENCOUNTER — Other Ambulatory Visit: Payer: Self-pay | Admitting: Internal Medicine

## 2023-10-08 DIAGNOSIS — I495 Sick sinus syndrome: Secondary | ICD-10-CM

## 2023-10-08 LAB — CUP PACEART REMOTE DEVICE CHECK
Battery Remaining Longevity: 82 mo
Battery Voltage: 2.98 V
Brady Statistic AP VP Percent: 0.09 %
Brady Statistic AP VS Percent: 94.96 %
Brady Statistic AS VP Percent: 0.01 %
Brady Statistic AS VS Percent: 4.94 %
Brady Statistic RA Percent Paced: 95.05 %
Brady Statistic RV Percent Paced: 0.1 %
Date Time Interrogation Session: 20241211234639
Implantable Lead Connection Status: 753985
Implantable Lead Connection Status: 753985
Implantable Lead Implant Date: 20180808
Implantable Lead Implant Date: 20180808
Implantable Lead Location: 753859
Implantable Lead Location: 753860
Implantable Lead Model: 3830
Implantable Lead Model: 5076
Implantable Pulse Generator Implant Date: 20180808
Lead Channel Impedance Value: 266 Ohm
Lead Channel Impedance Value: 323 Ohm
Lead Channel Impedance Value: 380 Ohm
Lead Channel Impedance Value: 513 Ohm
Lead Channel Pacing Threshold Amplitude: 0.875 V
Lead Channel Pacing Threshold Amplitude: 1 V
Lead Channel Pacing Threshold Pulse Width: 0.4 ms
Lead Channel Pacing Threshold Pulse Width: 0.4 ms
Lead Channel Sensing Intrinsic Amplitude: 4.25 mV
Lead Channel Sensing Intrinsic Amplitude: 4.25 mV
Lead Channel Sensing Intrinsic Amplitude: 6 mV
Lead Channel Sensing Intrinsic Amplitude: 6 mV
Lead Channel Setting Pacing Amplitude: 2 V
Lead Channel Setting Pacing Amplitude: 2.5 V
Lead Channel Setting Pacing Pulse Width: 0.4 ms
Lead Channel Setting Sensing Sensitivity: 0.9 mV
Zone Setting Status: 755011
Zone Setting Status: 755011

## 2023-10-27 ENCOUNTER — Other Ambulatory Visit: Payer: Self-pay | Admitting: Internal Medicine

## 2023-11-11 ENCOUNTER — Other Ambulatory Visit: Payer: Self-pay | Admitting: Internal Medicine

## 2023-11-12 NOTE — Telephone Encounter (Signed)
Rx sent electronically.  

## 2023-11-12 NOTE — Telephone Encounter (Signed)
Refill request for gabapentin (NEURONTIN) 300 MG capsule   LOV - 10/02/23 Next OV - not scheduled Last refill - 02/26/23 #120/5

## 2023-11-12 NOTE — Telephone Encounter (Signed)
This is a [place] pt 

## 2023-11-12 NOTE — Progress Notes (Signed)
Remote pacemaker transmission.   

## 2023-12-07 ENCOUNTER — Other Ambulatory Visit: Payer: Self-pay | Admitting: Internal Medicine

## 2024-01-07 ENCOUNTER — Ambulatory Visit: Payer: Medicare Other

## 2024-01-07 DIAGNOSIS — I495 Sick sinus syndrome: Secondary | ICD-10-CM

## 2024-01-07 LAB — CUP PACEART REMOTE DEVICE CHECK
Battery Remaining Longevity: 77 mo
Battery Voltage: 2.98 V
Brady Statistic AP VP Percent: 0.1 %
Brady Statistic AP VS Percent: 98.42 %
Brady Statistic AS VP Percent: 0.01 %
Brady Statistic AS VS Percent: 1.48 %
Brady Statistic RA Percent Paced: 98.52 %
Brady Statistic RV Percent Paced: 0.1 %
Date Time Interrogation Session: 20250313004906
Implantable Lead Connection Status: 753985
Implantable Lead Connection Status: 753985
Implantable Lead Implant Date: 20180808
Implantable Lead Implant Date: 20180808
Implantable Lead Location: 753859
Implantable Lead Location: 753860
Implantable Lead Model: 3830
Implantable Lead Model: 5076
Implantable Pulse Generator Implant Date: 20180808
Lead Channel Impedance Value: 266 Ohm
Lead Channel Impedance Value: 323 Ohm
Lead Channel Impedance Value: 361 Ohm
Lead Channel Impedance Value: 494 Ohm
Lead Channel Pacing Threshold Amplitude: 0.75 V
Lead Channel Pacing Threshold Amplitude: 0.875 V
Lead Channel Pacing Threshold Pulse Width: 0.4 ms
Lead Channel Pacing Threshold Pulse Width: 0.4 ms
Lead Channel Sensing Intrinsic Amplitude: 2.125 mV
Lead Channel Sensing Intrinsic Amplitude: 2.125 mV
Lead Channel Sensing Intrinsic Amplitude: 5.5 mV
Lead Channel Sensing Intrinsic Amplitude: 5.5 mV
Lead Channel Setting Pacing Amplitude: 2 V
Lead Channel Setting Pacing Amplitude: 2.5 V
Lead Channel Setting Pacing Pulse Width: 0.4 ms
Lead Channel Setting Sensing Sensitivity: 0.9 mV
Zone Setting Status: 755011
Zone Setting Status: 755011

## 2024-01-11 ENCOUNTER — Other Ambulatory Visit: Payer: Self-pay | Admitting: Internal Medicine

## 2024-01-12 ENCOUNTER — Encounter: Payer: Self-pay | Admitting: Internal Medicine

## 2024-01-15 ENCOUNTER — Other Ambulatory Visit: Payer: Self-pay | Admitting: Internal Medicine

## 2024-02-19 NOTE — Progress Notes (Signed)
 Remote pacemaker transmission.

## 2024-02-25 ENCOUNTER — Other Ambulatory Visit: Payer: Self-pay | Admitting: Internal Medicine

## 2024-03-22 ENCOUNTER — Other Ambulatory Visit: Payer: Self-pay | Admitting: Internal Medicine

## 2024-04-07 ENCOUNTER — Ambulatory Visit
Admission: RE | Admit: 2024-04-07 | Discharge: 2024-04-07 | Disposition: A | Discharge status: Home / Self Care | Source: Ambulatory Visit | Attending: Internal Medicine | Admitting: Internal Medicine

## 2024-04-07 ENCOUNTER — Ambulatory Visit: Payer: Self-pay | Admitting: Internal Medicine

## 2024-04-07 ENCOUNTER — Encounter: Payer: Self-pay | Admitting: Internal Medicine

## 2024-04-07 ENCOUNTER — Ambulatory Visit (INDEPENDENT_AMBULATORY_CARE_PROVIDER_SITE_OTHER): Payer: Medicare Other

## 2024-04-07 VITALS — BP 130/78 | HR 78 | Ht 61.5 in | Wt 119.2 lb

## 2024-04-07 DIAGNOSIS — I10 Essential (primary) hypertension: Secondary | ICD-10-CM

## 2024-04-07 DIAGNOSIS — S0093XA Contusion of unspecified part of head, initial encounter: Secondary | ICD-10-CM | POA: Insufficient documentation

## 2024-04-07 DIAGNOSIS — S0990XA Unspecified injury of head, initial encounter: Secondary | ICD-10-CM | POA: Diagnosis present

## 2024-04-07 DIAGNOSIS — F5101 Primary insomnia: Secondary | ICD-10-CM

## 2024-04-07 DIAGNOSIS — R739 Hyperglycemia, unspecified: Secondary | ICD-10-CM | POA: Diagnosis not present

## 2024-04-07 DIAGNOSIS — I739 Peripheral vascular disease, unspecified: Secondary | ICD-10-CM

## 2024-04-07 DIAGNOSIS — I700 Atherosclerosis of aorta: Secondary | ICD-10-CM | POA: Diagnosis not present

## 2024-04-07 DIAGNOSIS — J410 Simple chronic bronchitis: Secondary | ICD-10-CM

## 2024-04-07 DIAGNOSIS — D751 Secondary polycythemia: Secondary | ICD-10-CM

## 2024-04-07 DIAGNOSIS — I495 Sick sinus syndrome: Secondary | ICD-10-CM

## 2024-04-07 DIAGNOSIS — I480 Paroxysmal atrial fibrillation: Secondary | ICD-10-CM

## 2024-04-07 DIAGNOSIS — G609 Hereditary and idiopathic neuropathy, unspecified: Secondary | ICD-10-CM

## 2024-04-07 DIAGNOSIS — G4700 Insomnia, unspecified: Secondary | ICD-10-CM | POA: Insufficient documentation

## 2024-04-07 DIAGNOSIS — F39 Unspecified mood [affective] disorder: Secondary | ICD-10-CM

## 2024-04-07 LAB — CUP PACEART REMOTE DEVICE CHECK
Battery Remaining Longevity: 74 mo
Battery Voltage: 2.97 V
Brady Statistic AP VP Percent: 0.06 %
Brady Statistic AP VS Percent: 98.02 %
Brady Statistic AS VP Percent: 0.01 %
Brady Statistic AS VS Percent: 1.92 %
Brady Statistic RA Percent Paced: 98.03 %
Brady Statistic RV Percent Paced: 0.06 %
Date Time Interrogation Session: 20250612004834
Implantable Lead Connection Status: 753985
Implantable Lead Connection Status: 753985
Implantable Lead Implant Date: 20180808
Implantable Lead Implant Date: 20180808
Implantable Lead Location: 753859
Implantable Lead Location: 753860
Implantable Lead Model: 3830
Implantable Lead Model: 5076
Implantable Pulse Generator Implant Date: 20180808
Lead Channel Impedance Value: 285 Ohm
Lead Channel Impedance Value: 342 Ohm
Lead Channel Impedance Value: 380 Ohm
Lead Channel Impedance Value: 513 Ohm
Lead Channel Pacing Threshold Amplitude: 0.875 V
Lead Channel Pacing Threshold Amplitude: 1 V
Lead Channel Pacing Threshold Pulse Width: 0.4 ms
Lead Channel Pacing Threshold Pulse Width: 0.4 ms
Lead Channel Sensing Intrinsic Amplitude: 2.375 mV
Lead Channel Sensing Intrinsic Amplitude: 2.375 mV
Lead Channel Sensing Intrinsic Amplitude: 5.375 mV
Lead Channel Sensing Intrinsic Amplitude: 5.375 mV
Lead Channel Setting Pacing Amplitude: 2 V
Lead Channel Setting Pacing Amplitude: 2.5 V
Lead Channel Setting Pacing Pulse Width: 0.4 ms
Lead Channel Setting Sensing Sensitivity: 0.9 mV
Zone Setting Status: 755011
Zone Setting Status: 755011

## 2024-04-07 MED ORDER — TRELEGY ELLIPTA 100-62.5-25 MCG/ACT IN AEPB: 1.0000 | INHALATION_SPRAY | Freq: Every day | RESPIRATORY_TRACT | 11 refills | Status: DC

## 2024-04-07 NOTE — Assessment & Plan Note (Signed)
 Encourage smoking cessation-although she reports she has failed every method available We will start her on Trelegy 1 puff daily Continue albuterol  as needed

## 2024-04-07 NOTE — Assessment & Plan Note (Signed)
 Stable on sertraline  50 mg daily Support offered

## 2024-04-07 NOTE — Patient Instructions (Signed)

## 2024-04-07 NOTE — Assessment & Plan Note (Signed)
 Controlled on losartan  50 mg daily, diltiazem  120 mg daily Reinforced DASH diet C-Met today

## 2024-04-07 NOTE — Assessment & Plan Note (Signed)
 C-Met and lipid profile today Encouraged smoking cessation Encouraged her to consume a low-fat diet Not currently on statin therapy Will continue aspirin  81 mg daily

## 2024-04-07 NOTE — Assessment & Plan Note (Signed)
CBC today Encourage smoking cessation

## 2024-04-07 NOTE — Assessment & Plan Note (Signed)
 Stable on her current dose of trazodone  50 mg We will monitor

## 2024-04-07 NOTE — Assessment & Plan Note (Signed)
 Continue gabapentin  900 mg at bedtime

## 2024-04-07 NOTE — Assessment & Plan Note (Signed)
 C-Met and lipid profile today Encouraged her to consume a low-fat diet Not currently on statin therapy Will continue aspirin  81 mg daily

## 2024-04-07 NOTE — Assessment & Plan Note (Signed)
 Managed on diltiazem  120 mg daily, flecainide  50 mg twice daily and aspirin  81 mg daily She will continue to follow with cardiology

## 2024-04-07 NOTE — Progress Notes (Signed)
 Subjective:    Patient ID: Heidi Carroll, female    DOB: [birth year], 83 y.o.   MRN: [id number]  HPI  Patient presents to clinic today to establish care and for management of the conditions listed below.  A-fib/aflutter: s/p failed ablation. She now has a pacemaker. Chronic, managed with diltiazem , flecainide  and aspirin .  ECG from 08/2023 reviewed.  She follows with cardiology.  HLD with PAD, aortic atherosclerosis: Her last LDL was 90, triglycerides 73, 09/2023.  She is not taking any cholesterol-lowering medication at this time but is taking aspirin .  She tries to consume a low-fat diet.  HTN: Her BP today is 130/78.  She is taking losartan , diltiazem  prescribed.  ECG from 08/2023 reviewed.  COPD: She denies chronic cough but does have shortness of breath.  She is using albuterol  only as needed.  There are no PFTs on file.  She does not follow with pulmonology.  Idiopathic peripheral neuropathy: Managed with gabapentin .  She does not follow with neurology.  Anxiety and depression: Chronic, managed on sertraline  but she no longer takes alprazolam .  She is not currently seeing a therapist but has in the past.  She denies SI/HI.  Insomnia: She has difficulty staying asleep. She takes trazodone  as prescribed. There is no sleep study on file.  Polycythemia: Her last H/H was 17.2/48.8, 09/2023.  She does smoke.  She does not follow with hematology.  Of note, she reports she fell out of bed this morning hitting the back of her head on her nightstand.  She is not experiencing any neck pain, vision changes, dizziness or confusion but does have a headache.  She reports significant bruising in the area.  Review of Systems   Past Medical History:  Diagnosis Date   A-fib (HCC)    Depression    Hypertension    Neuropathy    idiopathic   Presence of permanent cardiac pacemaker     Current Outpatient Medications  Medication Sig Dispense Refill   acetaminophen  (TYLENOL ) 500 MG tablet  Take 500 mg by mouth daily as needed for moderate pain or headache.     albuterol  (VENTOLIN  HFA) 108 (90 Base) MCG/ACT inhaler INHALE 2 PUFFS BY MOUTH EVERY 6 HOURS AS NEEDED FOR WHEEZING OR SHORTNESS OF BREATH 18 g 0   ALPRAZolam  (XANAX ) 0.25 MG tablet 1/2 to 1 by mouth twice a day as needed 60 tablet 0   aspirin  EC 81 MG tablet Take 81 mg by mouth every evening.      Calcium  Carbonate-Vitamin D (CALCIUM  600+D PO) Take 1 tablet by mouth every evening.      diltiazem  (CARDIZEM  CD) 120 MG 24 hr capsule Take 1 capsule by mouth once daily 90 capsule 1   flecainide  (TAMBOCOR ) 50 MG tablet Take 1 tablet (50 mg total) by mouth 2 (two) times daily. 180 tablet 3   gabapentin  (NEURONTIN ) 300 MG capsule TAKE 3 TO 4 CAPSULES BY MOUTH AT BEDTIME 120 capsule 11   losartan  (COZAAR ) 50 MG tablet Take 1 tablet (50 mg total) by mouth daily. 90 tablet 3   Multiple Vitamins-Minerals (CENTRUM ADULTS PO) Take 1 tablet by mouth daily.     sertraline  (ZOLOFT ) 50 MG tablet Take 1 tablet by mouth once daily 90 tablet 3   traZODone  (DESYREL ) 50 MG tablet TAKE 1 TO 2 TABLETS BY MOUTH AT BEDTIME 180 tablet 3   No current facility-administered medications for this visit.    Allergies  Allergen Reactions   Cymbalta  [Duloxetine  Hcl] Nausea Only  Wellbutrin  [Bupropion ] Other (See Comments)    Elevated blood pressure, anxiety   Nicotine Palpitations    Nicotine Patches    Penicillins Rash    Has patient had a PCN reaction causing immediate rash, facial/tongue/throat swelling, SOB or lightheadedness with hypotension yes Has patient had a PCN reaction causing severe rash involving mucus membranes or skin necrosis: no Has patient had a PCN reaction that required hospitalization no Has patient had a PCN reaction occurring within the last 10 years: no If all of the above answers are NO, then may proceed with Cephalosporin use.    Family History  Problem Relation Age of Onset   Cancer Father        colon   Heart  attack Brother    Diabetes Maternal Grandfather     Social History   Socioeconomic History   Marital status: Widowed    Spouse name: Not on file   Number of children: 1   Years of education: Not on file   Highest education level: Some college, no degree  Occupational History   Occupation: Retired, [employer]selling psychologist)  Tobacco Use   Smoking status: Every Day    Current packs/day: 1.00    Average packs/day: 1 pack/day for 60.0 years (60.0 ttl pk-yrs)    Types: Cigarettes   Smokeless tobacco: Never  Vaping Use   Vaping status: Never Used  Substance and Sexual Activity   Alcohol use: No   Drug use: No   Sexual activity: Never  Other Topics Concern   Not on file  Social History Narrative   Husband died 09-05-2024      Has living will   Has health care POA--- Dyana Glade, friend   Considering change to her newly found son (that had been adopted)   Requests DNR--- form done 12/22/14   No tube feedings if cognitively unaware   Social Drivers of Health   Financial Resource Strain: Low Risk  (04/03/2024)   Overall Financial Resource Strain (CARDIA)    Difficulty of Paying Living Expenses: Not hard at all  Food Insecurity: No Food Insecurity (04/03/2024)   Hunger Vital Sign    Worried About Running Out of Food in the Last Year: Never true    Ran Out of Food in the Last Year: Never true  Transportation Needs: No Transportation Needs (04/03/2024)   PRAPARE - Administrator, Civil Service (Medical): No    Lack of Transportation (Non-Medical): No  Physical Activity: Sufficiently Active (04/03/2024)   Exercise Vital Sign    Days of Exercise per Week: 5 days    Minutes of Exercise per Session: 60 min  Stress: No Stress Concern Present (04/03/2024)   [employer] of Occupational Health - Occupational Stress Questionnaire    Feeling of Stress : Not at all  Social Connections: Socially Isolated (04/03/2024)   Social Connection and Isolation Panel    Frequency of Communication  with Friends and Family: Three times a week    Frequency of Social Gatherings with Friends and Family: More than three times a week    Attends Religious Services: Never    Database administrator or Organizations: No    Attends Banker Meetings: Not on file    Marital Status: Widowed  Intimate Partner Violence: Not on file     Constitutional: Denies fever, malaise, fatigue, headache or abrupt weight changes.  HEENT: Denies eye pain, eye redness, ear pain, ringing in the ears, wax buildup, runny nose, nasal congestion,  bloody nose, or sore throat. Respiratory: Pt reports intermittent shortness of breath. Denies difficulty breathing, cough or sputum production.   Cardiovascular: Denies chest pain, chest tightness, palpitations or swelling in the hands or feet.  Gastrointestinal: Pt reports poor appetite, intermittent constipation. Denies abdominal pain, bloating, diarrhea or blood in the stool.  GU: Denies urgency, frequency, pain with urination, burning sensation, blood in urine, odor or discharge. Musculoskeletal: Denies decrease in range of motion, difficulty with gait, muscle pain or joint pain and swelling.  Skin: Pt reports bruising to back of head. Denies redness, rashes, lesions or ulcercations.  Neurological: Patient reports neuropathic pain.  Denies dizziness, difficulty with memory, difficulty with speech or problems with balance and coordination.  Psych: Patient has a history of anxiety and depression.  Denies SI/HI.  No other specific complaints in a complete review of systems (except as listed in HPI above).      Objective:   Physical Exam  BP 130/78 (BP Location: Left Arm, Patient Position: Sitting, Cuff Size: Normal)   Pulse 78   Ht 5' 1.5 (1.562 m)   Wt 119 lb 3.2 oz (54.1 kg)   SpO2 95%   BMI 22.16 kg/m   Wt Readings from Last 3 Encounters:  10/02/23 114 lb (51.7 kg)  09/03/23 113 lb (51.3 kg)  09/15/22 124 lb (56.2 kg)    General: Appears her  stated age, chronically ill-appearing, in NAD. Skin: Warm, dry and intact.  9 cm x 4.5 cm hematoma noted at the base of the skull. HEENT: Head: normal shape and size; Eyes: sclera white, no icterus, conjunctiva pink, PERRLA and EOMs intact;  Cardiovascular: Normal rate and rhythm. S1,S2 noted.  No murmur, rubs or gallops noted. No JVD or BLE edema. No carotid bruits noted. Pulmonary/Chest: Increased effort and diminished breath sounds. No respiratory distress. No wheezes, rales or ronchi noted.  Abdomen: Soft and nontender. Normal bowel sounds.  Musculoskeletal: Barrel chested with pectus carinatum. Normal flexion and rotation to the right of the cervical spine.  Decreased extension of the cervical spine and rotation to the left secondary to pain.  No bony tenderness noted over the cervical spine.  Shoulder shrug equal.  Strength 5/5 BUE.  Handgrips equal.  No difficulty with gait.  Neurological: Alert and oriented. Cranial nerves II-XII grossly intact. Coordination normal.  Psychiatric: Mood and affect normal. Behavior is normal. Judgment and thought content normal.    BMET    Component Value Date/Time   NA 140 10/02/2023 1320   NA 143 05/28/2017 1209   K 5.1 10/02/2023 1320   CL 99 10/02/2023 1320   CO2 34 (H) 10/02/2023 1320   GLUCOSE 76 10/02/2023 1320   BUN 18 10/02/2023 1320   BUN 14 05/28/2017 1209   CREATININE 0.63 10/02/2023 1320   CALCIUM  9.6 10/02/2023 1320   GFRNONAA >60 04/22/2019 1923   GFRAA >60 04/22/2019 1923    Lipid Panel     Component Value Date/Time   CHOL 174 10/02/2023 1320   TRIG 73.0 10/02/2023 1320   HDL 69.70 10/02/2023 1320   CHOLHDL 2 10/02/2023 1320   VLDL 14.6 10/02/2023 1320   LDLCALC 90 10/02/2023 1320    CBC    Component Value Date/Time   WBC 6.6 10/02/2023 1320   RBC 4.81 10/02/2023 1320   HGB 17.2 (H) 10/02/2023 1320   HGB 15.8 05/28/2017 1209   HCT 48.8 (H) 10/02/2023 1320   HCT 47.7 (H) 05/28/2017 1209   PLT 199.0 10/02/2023 1320  PLT 200 05/28/2017 1209   MCV 101.4 (H) 10/02/2023 1320   MCV 94 05/28/2017 1209   MCH 32.6 04/22/2019 1923   MCHC 35.3 10/02/2023 1320   RDW 13.0 10/02/2023 1320   RDW 13.3 05/28/2017 1209   LYMPHSABS 2.5 05/28/2017 1209   MONOABS 0.5 12/22/2014 1255   EOSABS 0.1 05/28/2017 1209   BASOSABS 0.0 05/28/2017 1209    Hgb A1C No results found for: HGBA1C          Assessment & Plan:  Minor head injury, hematoma of skull:  Given the size of the hematoma would recommend stat CT of the head and cervical spine We will follow-up with further recommendation and treatment plan once imaging results are reviewed  RTC in 6 months for your annual exam Helayne Lo, NP

## 2024-04-08 LAB — COMPREHENSIVE METABOLIC PANEL WITH GFR
AG Ratio: 1.8 (calc) (ref 1.0–2.5)
ALT: 10 U/L (ref 6–29)
AST: 17 U/L (ref 10–35)
Albumin: 4.4 g/dL (ref 3.6–5.1)
Alkaline phosphatase (APISO): 85 U/L (ref 37–153)
BUN: 18 mg/dL (ref 7–25)
CO2: 30 mmol/L (ref 20–32)
Calcium: 9.5 mg/dL (ref 8.6–10.4)
Chloride: 100 mmol/L (ref 98–110)
Creat: 0.73 mg/dL (ref 0.60–0.95)
Globulin: 2.5 g/dL (ref 1.9–3.7)
Glucose, Bld: 100 mg/dL — ABNORMAL HIGH (ref 65–99)
Potassium: 4.4 mmol/L (ref 3.5–5.3)
Sodium: 138 mmol/L (ref 135–146)
Total Bilirubin: 0.6 mg/dL (ref 0.2–1.2)
Total Protein: 6.9 g/dL (ref 6.1–8.1)
eGFR: 83 mL/min/{1.73_m2} (ref >=60)

## 2024-04-08 LAB — LIPID PANEL
Cholesterol: 184 mg/dL (ref <200)
HDL: 82 mg/dL (ref >=50)
LDL Cholesterol (Calc): 85 mg/dL
Non-HDL Cholesterol (Calc): 102 mg/dL (ref <130)
Total CHOL/HDL Ratio: 2.2 (calc) (ref <5.0)
Triglycerides: 79 mg/dL (ref <150)

## 2024-04-08 LAB — HEMOGLOBIN A1C
Hgb A1c MFr Bld: 5.6 % (ref <5.7)
Mean Plasma Glucose: 114 mg/dL
eAG (mmol/L): 6.3 mmol/L

## 2024-04-08 LAB — CBC
HCT: 48.3 % — ABNORMAL HIGH (ref 35.0–45.0)
Hemoglobin: 16 g/dL — ABNORMAL HIGH (ref 11.7–15.5)
MCH: 33.3 pg — ABNORMAL HIGH (ref 27.0–33.0)
MCHC: 33.1 g/dL (ref 32.0–36.0)
MCV: 100.4 fL — ABNORMAL HIGH (ref 80.0–100.0)
MPV: 10.9 fL (ref 7.5–12.5)
Platelets: 157 10*3/uL (ref 140–400)
RBC: 4.81 10*6/uL (ref 3.80–5.10)
RDW: 11.6 % (ref 11.0–15.0)
WBC: 7.1 10*3/uL (ref 3.8–10.8)

## 2024-04-11 ENCOUNTER — Telehealth: Payer: Self-pay

## 2024-04-11 DIAGNOSIS — J410 Simple chronic bronchitis: Secondary | ICD-10-CM

## 2024-04-11 NOTE — Telephone Encounter (Signed)
 Copied from CRM [id number]. Topic: Clinical - Medication Question >> Apr 11, 2024  1:10 PM Heidi Carroll wrote: Patient wants to speak with pcp about prescription it's $800 she can't afford that and wants to know if there's something cheaper she can take. Fluticasone-Umeclidin-Vilant (TRELEGY ELLIPTA ) 100-62.5-25 MCG/ACT AEPB

## 2024-04-12 ENCOUNTER — Encounter: Payer: Self-pay | Admitting: Internal Medicine

## 2024-04-12 ENCOUNTER — Other Ambulatory Visit (HOSPITAL_COMMUNITY): Payer: Self-pay

## 2024-04-18 ENCOUNTER — Other Ambulatory Visit: Admitting: Pharmacist

## 2024-04-18 ENCOUNTER — Telehealth: Payer: Self-pay

## 2024-04-18 ENCOUNTER — Other Ambulatory Visit (HOSPITAL_COMMUNITY): Payer: Self-pay

## 2024-04-18 DIAGNOSIS — J410 Simple chronic bronchitis: Secondary | ICD-10-CM

## 2024-04-18 NOTE — Telephone Encounter (Signed)
 Medication is covered, however, price is $436.47, due to pt's remaining deductible. Unfortunately, a PA wouldn't affect this.

## 2024-04-18 NOTE — Patient Instructions (Addendum)
 Please pick up and start using your Trelegy inhaler 1 puff once daily as prescribed. Please remember following administration, rinse mouth with water after use (do not swallow).  If you would like to review the how to use video for this inhaler, please copy and paste the web address below into your internet browser:  [email or web address]  Thank you!  Sharyle Sia, PharmD, [facility] Clinical Pharmacist [facility] [phone]

## 2024-04-18 NOTE — Progress Notes (Signed)
 I guess she doesn't need the samples anymore

## 2024-04-18 NOTE — Telephone Encounter (Signed)
 Would she be willing to take a phone call from our clinical pharmacist to see if she qualifies for patient assistance?

## 2024-04-18 NOTE — Addendum Note (Signed)
 Addended by: ANTONETTE ANGELINE ORN on: 04/18/2024 08:30 AM   Modules accepted: Orders

## 2024-04-18 NOTE — Telephone Encounter (Signed)
 Pharmacy Patient Advocate Encounter   Received notification from Pt Calls Messages that prior authorization for Trelegy Ellipta  is required/requested.   Insurance verification completed.   The patient is insured through [employer] .   Per test claim: The current 30 day co-pay is, $436.47.  No PA needed at this time. This test claim was processed through [facility]- copay amounts may vary at other pharmacies due to pharmacy/plan contracts, or as the patient moves through the different stages of their insurance plan.

## 2024-04-18 NOTE — Telephone Encounter (Signed)
 Pharmacy Patient Advocate Encounter  Insurance verification completed.   The patient is insured through [employer] claim for [employer]. Currently a quantity of 10.7 is a 30 day supply and the co-pay is $433.33 .  This test claim was processed through [facility]- copay amounts may vary at other pharmacies due to pharmacy/plan contracts, or as the patient moves through the different stages of their insurance plan.

## 2024-04-18 NOTE — Progress Notes (Signed)
 Yes we have samples available.  I will leave two inhalers at the front desk for her to pick up.

## 2024-04-18 NOTE — Telephone Encounter (Signed)
 Referral to clinical pharmacy for medication assistance placed

## 2024-04-18 NOTE — Progress Notes (Signed)
 04/18/2024 Name: Heidi Carroll MRN: [id number] DOB: 01-07-1941  Chief Complaint  Patient presents with   Medication Assistance    Heidi Carroll is a 83 y.o. year old female who presented for a telephone visit.   They were referred to the pharmacist by their PCP for assistance in managing medication access.    Subjective:  Care Team: Primary Care Provider: Antonette Angeline ORN, NP ; Next Scheduled Visit: 10/06/2024 Cardiology Electrophysiologist: Elspeth Sage, MD   Medication Access/Adherence  Current Pharmacy:  [facility] [address]), [place] - [address] [place]) [place] [address] Phone: [phone] Fax: [phone]   Patient reports affordability concerns with their medications: Yes  Patient reports access/transportation concerns to their pharmacy: No  Patient reports adherence concerns with their medications:  No     COPD:  Current medications: - albuterol  HFA inhaler - 2 puffs every 6 hours as needed for wheezing or shortness of breath - Reports has not yet started Trelegy due to cost   Objective:    Lab Results  Component Value Date   CREATININE 0.73 04/07/2024   BUN 18 04/07/2024   NA 138 04/07/2024   K 4.4 04/07/2024   CL 100 04/07/2024   CO2 30 04/07/2024    Current Outpatient Medications on File Prior to Visit  Medication Sig Dispense Refill   acetaminophen  (TYLENOL ) 500 MG tablet Take 500 mg by mouth daily as needed for moderate pain or headache.     albuterol  (VENTOLIN  HFA) 108 (90 Base) MCG/ACT inhaler INHALE 2 PUFFS BY MOUTH EVERY 6 HOURS AS NEEDED FOR WHEEZING OR SHORTNESS OF BREATH 18 g 0   aspirin  EC 81 MG tablet Take 81 mg by mouth every evening.      Calcium  Carbonate-Vitamin D (CALCIUM  600+D PO) Take 1 tablet by mouth every evening.      diltiazem  (CARDIZEM  CD) 120 MG 24 hr capsule Take 1 capsule by mouth once daily 90 capsule 1   flecainide  (TAMBOCOR ) 50 MG tablet Take 1 tablet  (50 mg total) by mouth 2 (two) times daily. 180 tablet 3   Fluticasone-Umeclidin-Vilant (TRELEGY ELLIPTA ) 100-62.5-25 MCG/ACT AEPB Inhale 1 puff into the lungs daily. 1 each 11   gabapentin  (NEURONTIN ) 300 MG capsule TAKE 3 TO 4 CAPSULES BY MOUTH AT BEDTIME 120 capsule 11   losartan  (COZAAR ) 50 MG tablet Take 1 tablet (50 mg total) by mouth daily. 90 tablet 3   Multiple Vitamins-Minerals (CENTRUM ADULTS PO) Take 1 tablet by mouth daily.     sertraline  (ZOLOFT ) 50 MG tablet Take 1 tablet by mouth once daily 90 tablet 3   traZODone  (DESYREL ) 50 MG tablet TAKE 1 TO 2 TABLETS BY MOUTH AT BEDTIME 180 tablet 3   No current facility-administered medications on file prior to visit.        Assessment/Plan:   COPD: - Currently uncontrolled.  - Reports that based on income, does not think that she would qualify for patient assistance programs through manufacturer  - Advise patient to contact her [employer] Medicare Advantage plan to determine her annual deductible amount and her copayment for Trelegy inhaler once deductible is met When return call to patient, find that she has contacted plan and was advised her annual deductible is ~$500, but that once deductible is met, her copayment for Trelegy would be <$100/month. States that this is affordable for her  Plans to pick up from pharmacy and start Trelegy inhaler today - Reviewed appropriate  inhaler technique, including importance of rinsing out her mouth and spitting out after each use of Trelegy inhaler Encourage patient to have pharmacist at [facility] also review new inhaler with her when she picks up today  Follow Up Plan:   Patient denies further medication questions or concerns today Provide patient with contact information for clinic pharmacist to contact if needed in future for medication questions/concerns   Sharyle Sia, PharmD, [facility] Clinical Pharmacist [facility] [phone]

## 2024-05-02 ENCOUNTER — Ambulatory Visit: Payer: Self-pay | Admitting: Cardiology

## 2024-05-30 NOTE — Progress Notes (Signed)
 Remote pacemaker transmission.

## 2024-06-08 ENCOUNTER — Other Ambulatory Visit: Payer: Self-pay | Admitting: Internal Medicine

## 2024-07-07 ENCOUNTER — Ambulatory Visit: Payer: Medicare Other

## 2024-07-07 DIAGNOSIS — I495 Sick sinus syndrome: Secondary | ICD-10-CM

## 2024-07-07 LAB — CUP PACEART REMOTE DEVICE CHECK
Battery Remaining Longevity: 69 mo
Battery Voltage: 2.97 V
Brady Statistic AP VP Percent: 0.05 %
Brady Statistic AP VS Percent: 97.79 %
Brady Statistic AS VP Percent: 0.01 %
Brady Statistic AS VS Percent: 2.15 %
Brady Statistic RA Percent Paced: 97.75 %
Brady Statistic RV Percent Paced: 0.06 %
Date Time Interrogation Session: 20250911011348
Implantable Lead Connection Status: 753985
Implantable Lead Connection Status: 753985
Implantable Lead Implant Date: 20180808
Implantable Lead Implant Date: 20180808
Implantable Lead Location: 753859
Implantable Lead Location: 753860
Implantable Lead Model: 3830
Implantable Lead Model: 5076
Implantable Pulse Generator Implant Date: 20180808
Lead Channel Impedance Value: 285 Ohm
Lead Channel Impedance Value: 342 Ohm
Lead Channel Impedance Value: 399 Ohm
Lead Channel Impedance Value: 532 Ohm
Lead Channel Pacing Threshold Amplitude: 1 V
Lead Channel Pacing Threshold Amplitude: 1 V
Lead Channel Pacing Threshold Pulse Width: 0.4 ms
Lead Channel Pacing Threshold Pulse Width: 0.4 ms
Lead Channel Sensing Intrinsic Amplitude: 2 mV
Lead Channel Sensing Intrinsic Amplitude: 2 mV
Lead Channel Sensing Intrinsic Amplitude: 4.375 mV
Lead Channel Sensing Intrinsic Amplitude: 4.375 mV
Lead Channel Setting Pacing Amplitude: 2 V
Lead Channel Setting Pacing Amplitude: 2.5 V
Lead Channel Setting Pacing Pulse Width: 0.4 ms
Lead Channel Setting Sensing Sensitivity: 0.9 mV
Zone Setting Status: 755011
Zone Setting Status: 755011

## 2024-07-08 ENCOUNTER — Ambulatory Visit: Payer: Self-pay | Admitting: Cardiology

## 2024-07-15 NOTE — Progress Notes (Signed)
 Remote PPM Transmission

## 2024-09-04 ENCOUNTER — Other Ambulatory Visit: Payer: Self-pay | Admitting: Cardiology

## 2024-09-04 ENCOUNTER — Other Ambulatory Visit: Payer: Self-pay | Admitting: Internal Medicine

## 2024-09-06 NOTE — Telephone Encounter (Signed)
 Requested Prescriptions  Pending Prescriptions Disp Refills   albuterol  (VENTOLIN  HFA) 108 (90 Base) MCG/ACT inhaler [Pharmacy Med Name: Albuterol  Sulfate HFA 108 (90 Base) MCG/ACT Inhalation Aerosol Solution] 18 g 0    Sig: INHALE 2 PUFFS BY MOUTH EVERY 6 HOURS AS NEEDED FOR WHEEZING OR SHORTNESS OF BREATH     Pulmonology:  Beta Agonists 2 Passed - 09/06/2024 12:19 PM      Passed - Last BP in normal range    BP Readings from Last 1 Encounters:  04/07/24 130/78         Passed - Last Heart Rate in normal range    Pulse Readings from Last 1 Encounters:  04/07/24 78         Passed - Valid encounter within last 12 months    Recent Outpatient Visits           5 months ago Aortic atherosclerosis   [facility], Angeline ORN, [place]

## 2024-10-06 ENCOUNTER — Ambulatory Visit: Payer: Medicare Other

## 2024-10-06 ENCOUNTER — Encounter: Admitting: Internal Medicine

## 2024-10-07 ENCOUNTER — Other Ambulatory Visit: Payer: Self-pay | Admitting: Internal Medicine

## 2024-10-08 ENCOUNTER — Other Ambulatory Visit: Payer: Self-pay | Admitting: Cardiology

## 2024-10-11 LAB — CUP PACEART REMOTE DEVICE CHECK
Battery Remaining Longevity: 66 mo
Battery Voltage: 2.97 V
Brady Statistic AP VP Percent: 0.13 %
Brady Statistic AP VS Percent: 97.74 %
Brady Statistic AS VP Percent: 0.01 %
Brady Statistic AS VS Percent: 2.13 %
Brady Statistic RA Percent Paced: 97.87 %
Brady Statistic RV Percent Paced: 0.13 %
Date Time Interrogation Session: 20251216113256
Implantable Lead Connection Status: 753985
Implantable Lead Connection Status: 753985
Implantable Lead Implant Date: 20180808
Implantable Lead Implant Date: 20180808
Implantable Lead Location: 753859
Implantable Lead Location: 753860
Implantable Lead Model: 3830
Implantable Lead Model: 5076
Implantable Pulse Generator Implant Date: 20180808
Lead Channel Impedance Value: 285 Ohm
Lead Channel Impedance Value: 342 Ohm
Lead Channel Impedance Value: 399 Ohm
Lead Channel Impedance Value: 513 Ohm
Lead Channel Pacing Threshold Amplitude: 0.875 V
Lead Channel Pacing Threshold Amplitude: 0.875 V
Lead Channel Pacing Threshold Pulse Width: 0.4 ms
Lead Channel Pacing Threshold Pulse Width: 0.4 ms
Lead Channel Sensing Intrinsic Amplitude: 2.125 mV
Lead Channel Sensing Intrinsic Amplitude: 2.125 mV
Lead Channel Sensing Intrinsic Amplitude: 5.875 mV
Lead Channel Sensing Intrinsic Amplitude: 5.875 mV
Lead Channel Setting Pacing Amplitude: 2 V
Lead Channel Setting Pacing Amplitude: 2.5 V
Lead Channel Setting Pacing Pulse Width: 0.4 ms
Lead Channel Setting Sensing Sensitivity: 0.9 mV
Zone Setting Status: 755011
Zone Setting Status: 755011

## 2024-10-11 NOTE — Telephone Encounter (Signed)
 Requested Prescriptions  Pending Prescriptions Disp Refills   albuterol  (VENTOLIN  HFA) 108 (90 Base) MCG/ACT inhaler [Pharmacy Med Name: Albuterol  Sulfate HFA 108 (90 Base) MCG/ACT Inhalation Aerosol Solution] 18 g 0    Sig: INHALE 2 PUFFS BY MOUTH EVERY 6 HOURS AS NEEDED FOR WHEEZING FOR SHORTNESS OF BREATH     Pulmonology:  Beta Agonists 2 Passed - 10/11/2024  9:19 AM      Passed - Last BP in normal range    BP Readings from Last 1 Encounters:  04/07/24 130/78         Passed - Last Heart Rate in normal range    Pulse Readings from Last 1 Encounters:  04/07/24 78         Passed - Valid encounter within last 12 months    Recent Outpatient Visits           6 months ago Aortic atherosclerosis   [facility], Angeline ORN, [place]

## 2024-10-13 NOTE — Progress Notes (Signed)
 Remote PPM Transmission

## 2024-10-14 ENCOUNTER — Ambulatory Visit: Payer: Self-pay | Admitting: Cardiology

## 2024-11-01 ENCOUNTER — Ambulatory Visit: Attending: Cardiology | Admitting: Cardiology

## 2024-11-01 ENCOUNTER — Encounter: Payer: Self-pay | Admitting: Cardiology

## 2024-11-01 VITALS — BP 126/66 | HR 77 | Ht 61.5 in | Wt 125.8 lb

## 2024-11-01 DIAGNOSIS — Z79899 Other long term (current) drug therapy: Secondary | ICD-10-CM

## 2024-11-01 DIAGNOSIS — I495 Sick sinus syndrome: Secondary | ICD-10-CM

## 2024-11-01 DIAGNOSIS — Z5181 Encounter for therapeutic drug level monitoring: Secondary | ICD-10-CM | POA: Diagnosis not present

## 2024-11-01 DIAGNOSIS — I483 Typical atrial flutter: Secondary | ICD-10-CM

## 2024-11-01 DIAGNOSIS — I480 Paroxysmal atrial fibrillation: Secondary | ICD-10-CM | POA: Diagnosis not present

## 2024-11-01 DIAGNOSIS — Z950 Presence of cardiac pacemaker: Secondary | ICD-10-CM

## 2024-11-01 LAB — CUP PACEART INCLINIC DEVICE CHECK
Date Time Interrogation Session: 20260106153733
Implantable Lead Connection Status: 753985
Implantable Lead Connection Status: 753985
Implantable Lead Implant Date: 20180808
Implantable Lead Implant Date: 20180808
Implantable Lead Location: 753859
Implantable Lead Location: 753860
Implantable Lead Model: 3830
Implantable Lead Model: 5076
Implantable Pulse Generator Implant Date: 20180808

## 2024-11-01 NOTE — Patient Instructions (Addendum)
 Medication Instructions:  Your physician recommends that you continue on your current medications as directed. Please refer to the Current Medication list given to you today.  *If you need a refill on your cardiac medications before your next appointment, please call your pharmacy*  Lab Work:  Please remind Primary Care to have BMP and Magnesium  labs drawn. No labs ordered today    Testing/Procedures: Your physician has requested that you have an echocardiogram. Echocardiography is a painless test that uses sound waves to create images of your heart. It provides your doctor with information about the size and shape of your heart and how well your hearts chambers and valves are working.   You may receive an ultrasound enhancing agent through an IV if needed to better visualize your heart during the echo. This procedure takes approximately one hour.  There are no restrictions for this procedure.  This will take place at [address] [facility], [place] [address]  Please note: We ask at that you not bring children with you during ultrasound (echo/ vascular) testing. Due to room size and safety concerns, children are not allowed in the ultrasound rooms during exams. Our front office staff cannot provide observation of children in our lobby area while testing is being conducted. An adult accompanying a patient to their appointment will only be allowed in the ultrasound room at the discretion of the ultrasound technician under special circumstances. We apologize for any inconvenience.   Follow-Up: At [facility], you and your health needs are our priority.  As part of our continuing mission to provide you with exceptional heart care, our providers are all part of one team.  This team includes your primary Cardiologist (physician) and Advanced Practice Providers or APPs (Physician Assistants and Nurse Practitioners) who all work together to provide you with the care  you need, when you need it.  Your next appointment:   1 year(s)  Provider:   Suzann Riddle, NP or Dr. Fonda Kitty

## 2024-11-01 NOTE — Progress Notes (Signed)
 "     Electrophysiology Clinic Note    Date:  11/01/2024  Patient ID:  Heidi Carroll, DOB 29-Oct-1940, MRN [id number] PCP:  Antonette Angeline ORN, NP  Cardiologist:  None  Electrophysiologist:  Fonda Kitty, MD  Electrophysiology APP:  Clemma Johnsen, NP    Discussed the use of AI scribe software for clinical note transcription with the patient, who gave verbal consent to proceed.   Patient Profile    Chief Complaint: Aflutter, device follow-up  History of Present Illness: Heidi Carroll is a 84 y.o. female with PMH notable for atrial flutter, atrial tach, SND s/p PPM, PVCs, PAD, HTN, COPD ; seen today for Fonda Kitty, MD (Previously Dr. Fernande) for routine electrophysiology followup.   She is s/p Aflutter ablation 2018.  SCAF by PPM, not on OAC. I last saw her 08/2023 at which time she was doing well. Remained on flecainide  for AT and PVC mgmt.   On follow-up today, she is doing well without any acute complaints today.  She denies chest pain, chest pressure, palpitations.  She continues to take flecainide  twice daily along with diltiazem  daily.     Arrhythmia/Device History MDT dual chamber PPM, imp 05/2017; dx SND    AAD History: Flecainide       ROS:  Please see the history of present illness. All other systems are reviewed and otherwise negative.    Physical Exam    VS:  BP 126/66 (BP Location: Left Arm, Patient Position: Sitting, Cuff Size: Normal)   Pulse 77   Ht 5' 1.5 (1.562 m)   Wt 125 lb 12.8 oz (57.1 kg)   SpO2 96%   BMI 23.38 kg/m  BMI: Body mass index is 23.38 kg/m.           Wt Readings from Last 3 Encounters:  11/01/24 125 lb 12.8 oz (57.1 kg)  04/07/24 119 lb 3.2 oz (54.1 kg)  10/02/23 114 lb (51.7 kg)      GEN- The patient is well appearing, alert and oriented x 3 today.   Lungs- Clear to ausculation bilaterally, diminished throughout, normal work of breathing.  Heart- Regular rate and rhythm, no murmurs, rubs or gallops Extremities- No  peripheral edema, warm, dry Skin-  device pocket well-healed, no tethering   Device interrogation done today and reviewed by myself:  Battery 5.3 years Lead thresholds, impedence, sensing stable  Low VP < 1% Rare AT/AF episodes No changes made today  Studies Reviewed   Previous EP, cardiology notes.    EKG is ordered. Personal review of EKG from today shows:    EKG Interpretation Date/Time:  Tuesday November 01 2024 14:13:03 EST Ventricular Rate:  77 PR Interval:  240 QRS Duration:  92 QT Interval:  388 QTC Calculation: 439 R Axis:   -28  Text Interpretation: Atrial-paced rhythm with prolonged AV conduction RSR' or QR pattern in V1 suggests right ventricular conduction delay Confirmed by Marabelle Cushman [phone]) on 11/01/2024 2:22:57 PM    08/2023 EKG - AP at 79 w 1st deg HB, LAD.  PR 218,  QRS 86,  QTC 444  08/26/2022 EKG- AP at 82bpm.  PR QRS 90  TTE, 11/12/2016 - Left ventricle: The cavity size was normal. Wall thickness was normal. Systolic function was normal. The estimated ejection fraction was in the range of 55% to 65%.  - Aortic valve: There was mild regurgitation. Valve area (Vmax): 2.09 cm^2.  - Mitral valve: There was mild regurgitation.  - Right atrium: The atrium was  mildly dilated.    Assessment and Plan     #) Aflutter #) SCAF #) flecainide  monitoring S/p flutter ablation 2018 Continues to have very low AF burden with rare, brief episodes EKG with stable intervals on flecainide , new IVCD Continue 50mg  flecainide  BID + 120mg  diltiazem  Update TTE   #) SND s/p PPM Device functioning well, see paceart for details Battery good Lead measurements stable Low VP        Current medicines are reviewed at length with the patient today.   The patient does not have concerns regarding her medicines.  The following changes were made today:  none  Labs/ tests ordered today include:  Orders Placed This Encounter  Procedures   EKG 12-Lead    ECHOCARDIOGRAM COMPLETE     Disposition: Follow up with Dr. Kennyth or EP APP in 12 months, continue remote monitoring TTE next available, follow-up sooner if results abnormal   Signed, Chantal Needle, NP  11/01/2024  3:32 PM  Electrophysiology [facility] "

## 2024-11-05 ENCOUNTER — Ambulatory Visit: Payer: Self-pay | Admitting: Cardiology

## 2024-11-09 ENCOUNTER — Other Ambulatory Visit: Payer: Self-pay | Admitting: Cardiology

## 2024-11-09 NOTE — Telephone Encounter (Signed)
 In accordance with refill protocols, please review and address the following requirements before this medication refill can be authorized:  Labs

## 2024-11-10 ENCOUNTER — Other Ambulatory Visit: Payer: Self-pay

## 2024-11-10 ENCOUNTER — Encounter: Payer: Self-pay | Admitting: Internal Medicine

## 2024-11-10 ENCOUNTER — Ambulatory Visit: Admitting: Internal Medicine

## 2024-11-10 VITALS — BP 138/74 | Ht 61.5 in | Wt 125.8 lb

## 2024-11-10 DIAGNOSIS — Z23 Encounter for immunization: Secondary | ICD-10-CM | POA: Diagnosis not present

## 2024-11-10 DIAGNOSIS — I700 Atherosclerosis of aorta: Secondary | ICD-10-CM

## 2024-11-10 DIAGNOSIS — Z0001 Encounter for general adult medical examination with abnormal findings: Secondary | ICD-10-CM | POA: Diagnosis not present

## 2024-11-10 DIAGNOSIS — R739 Hyperglycemia, unspecified: Secondary | ICD-10-CM | POA: Diagnosis not present

## 2024-11-10 MED ORDER — DILTIAZEM HCL ER COATED BEADS 120 MG PO CP24: 120.0000 mg | ORAL_CAPSULE | Freq: Every day | ORAL | 3 refills | Status: AC

## 2024-11-10 MED ORDER — FLECAINIDE ACETATE 50 MG PO TABS: 50.0000 mg | ORAL_TABLET | Freq: Two times a day (BID) | ORAL | 3 refills | Status: AC

## 2024-11-10 MED ORDER — TRAZODONE HCL 50 MG PO TABS: 50.0000 mg | ORAL_TABLET | Freq: Every day | ORAL | 1 refills | Status: AC

## 2024-11-10 MED ORDER — SERTRALINE HCL 50 MG PO TABS: 50.0000 mg | ORAL_TABLET | Freq: Every day | ORAL | 1 refills | Status: AC

## 2024-11-10 NOTE — Patient Instructions (Signed)
 Health Maintenance for Postmenopausal Women Menopause is a normal process in which your ability to get pregnant comes to an end. This process happens slowly over many months or years, usually between the ages of 76 and 38. Menopause is complete when you have missed your menstrual period for 12 months. It is important to talk with your health care provider about some of the most common conditions that affect women after menopause (postmenopausal women). These include heart disease, cancer, and bone loss (osteoporosis). Adopting a healthy lifestyle and getting preventive care can help to promote your health and wellness. The actions you take can also lower your chances of developing some of these common conditions. What are the signs and symptoms of menopause? During menopause, you may have the following symptoms: Hot flashes. These can be moderate or severe. Night sweats. Decrease in sex drive. Mood swings. Headaches. Tiredness (fatigue). Irritability. Memory problems. Problems falling asleep or staying asleep. Talk with your health care provider about treatment options for your symptoms. Do I need hormone replacement therapy? Hormone replacement therapy is effective in treating symptoms that are caused by menopause, such as hot flashes and night sweats. Hormone replacement carries certain risks, especially as you become older. If you are thinking about using estrogen or estrogen with progestin, discuss the benefits and risks with your health care provider. How can I reduce my risk for heart disease and stroke? The risk of heart disease, heart attack, and stroke increases as you age. One of the causes may be a change in the body's hormones during menopause. This can affect how your body uses dietary fats, triglycerides, and cholesterol. Heart attack and stroke are medical emergencies. There are many things that you can do to help prevent heart disease and stroke. Watch your blood pressure High  blood pressure causes heart disease and increases the risk of stroke. This is more likely to develop in people who have high blood pressure readings or are overweight. Have your blood pressure checked: Every 3-5 years if you are 32-23 years of age. Every year if you are 31 years old or older. Eat a healthy diet  Eat a diet that includes plenty of vegetables, fruits, low-fat dairy products, and lean protein. Do not eat a lot of foods that are high in solid fats, added sugars, or sodium. Get regular exercise Get regular exercise. This is one of the most important things you can do for your health. Most adults should: Try to exercise for at least 150 minutes each week. The exercise should increase your heart rate and make you sweat (moderate-intensity exercise). Try to do strengthening exercises at least twice each week. Do these in addition to the moderate-intensity exercise. Spend less time sitting. Even light physical activity can be beneficial. Other tips Work with your health care provider to achieve or maintain a healthy weight. Do not use any products that contain nicotine or tobacco. These products include cigarettes, chewing tobacco, and vaping devices, such as e-cigarettes. If you need help quitting, ask your health care provider. Know your numbers. Ask your health care provider to check your cholesterol and your blood sugar (glucose). Continue to have your blood tested as directed by your health care provider. Do I need screening for cancer? Depending on your health history and family history, you may need to have cancer screenings at different stages of your life. This may include screening for: Breast cancer. Cervical cancer. Lung cancer. Colorectal cancer. What is my risk for osteoporosis? After menopause, you may be  at increased risk for osteoporosis. Osteoporosis is a condition in which bone destruction happens more quickly than new bone creation. To help prevent osteoporosis or  the bone fractures that can happen because of osteoporosis, you may take the following actions: If you are 24-54 years old, get at least 1,000 mg of calcium and at least 600 international units (IU) of vitamin D  per day. If you are older than age 75 but younger than age 30, get at least 1,200 mg of calcium and at least 600 international units (IU) of vitamin D  per day. If you are older than age 8, get at least 1,200 mg of calcium and at least 800 international units (IU) of vitamin D  per day. Smoking and drinking excessive alcohol increase the risk of osteoporosis. Eat foods that are rich in calcium and vitamin D , and do weight-bearing exercises several times each week as directed by your health care provider. How does menopause affect my mental health? Depression may occur at any age, but it is more common as you become older. Common symptoms of depression include: Feeling depressed. Changes in sleep patterns. Changes in appetite or eating patterns. Feeling an overall lack of motivation or enjoyment of activities that you previously enjoyed. Frequent crying spells. Talk with your health care provider if you think that you are experiencing any of these symptoms. General instructions See your health care provider for regular wellness exams and vaccines. This may include: Scheduling regular health, dental, and eye exams. Getting and maintaining your vaccines. These include: Influenza vaccine. Get this vaccine each year before the flu season begins. Pneumonia vaccine. Shingles vaccine. Tetanus, diphtheria, and pertussis (Tdap) booster vaccine. Your health care provider may also recommend other immunizations. Tell your health care provider if you have ever been abused or do not feel safe at home. Summary Menopause is a normal process in which your ability to get pregnant comes to an end. This condition causes hot flashes, night sweats, decreased interest in sex, mood swings, headaches, or lack  of sleep. Treatment for this condition may include hormone replacement therapy. Take actions to keep yourself healthy, including exercising regularly, eating a healthy diet, watching your weight, and checking your blood pressure and blood sugar levels. Get screened for cancer and depression. Make sure that you are up to date with all your vaccines. This information is not intended to replace advice given to you by your health care provider. Make sure you discuss any questions you have with your health care provider. Document Revised: 03/04/2021 Document Reviewed: 03/04/2021 Elsevier Patient Education  2024 [employer].

## 2024-11-10 NOTE — Progress Notes (Signed)
 "  Subjective:    Patient ID: Heidi Carroll, female    DOB: [birth year], 84 y.o.   MRN: [id number]  HPI  Patient presents to clinic today for her annual exam.  Flu: 09/2023 Tetanus: 01/2016 COVID: X 3 Pneumovax: 08/2017 Prevnar 13: 11/2014 Zostovax: 12/2014 Shingrix: 12/2020, 03/2019 Pap smear: Hysterectomy Mammogram: 01/2011 Bone density: never CT lung cancer screening: never Colon screening: No longer screening Vision screening: annually Dentist: dentures  Diet: She does eat meat. She consumes fruits and veggies. She does eat some fried foods. She drinks mostly coffee. Exercise: Walking    Past Medical History:  Diagnosis Date   A-fib (HCC)    Depression    Hypertension    Neuropathy    idiopathic   Presence of permanent cardiac pacemaker     Current Outpatient Medications  Medication Sig Dispense Refill   acetaminophen  (TYLENOL ) 500 MG tablet Take 500 mg by mouth daily as needed for moderate pain or headache.     albuterol  (VENTOLIN  HFA) 108 (90 Base) MCG/ACT inhaler INHALE 2 PUFFS BY MOUTH EVERY 6 HOURS AS NEEDED FOR WHEEZING FOR SHORTNESS OF BREATH 18 g 0   aspirin  EC 81 MG tablet Take 81 mg by mouth every evening.      Calcium  Carbonate-Vitamin D (CALCIUM  600+D PO) Take 1 tablet by mouth every evening.      diltiazem  (CARDIZEM  CD) 120 MG 24 hr capsule Take 1 capsule by mouth once daily 90 capsule 0   flecainide  (TAMBOCOR ) 50 MG tablet Take 1 tablet (50 mg total) by mouth 2 (two) times daily. 60 tablet 0   Fluticasone -Umeclidin-Vilant (TRELEGY ELLIPTA ) 100-62.5-25 MCG/ACT AEPB Inhale 1 puff into the lungs daily. 1 each 11   gabapentin  (NEURONTIN ) 300 MG capsule TAKE 3 TO 4 CAPSULES BY MOUTH AT BEDTIME 120 capsule 11   losartan  (COZAAR ) 50 MG tablet Take 1 tablet by mouth once daily 90 tablet 0   Multiple Vitamins-Minerals (CENTRUM ADULTS PO) Take 1 tablet by mouth daily.     sertraline  (ZOLOFT ) 50 MG tablet Take 1 tablet by mouth once daily 90 tablet 3   traZODone   (DESYREL ) 50 MG tablet TAKE 1 TO 2 TABLETS BY MOUTH AT BEDTIME 180 tablet 3   No current facility-administered medications for this visit.    Allergies[1]  Family History  Problem Relation Age of Onset   Cancer Father        colon   Heart attack Brother    Diabetes Maternal Grandfather     Social History   Socioeconomic History   Marital status: Widowed    Spouse name: Not on file   Number of children: 1   Years of education: Not on file   Highest education level: Some college, no degree  Occupational History   Occupation: Retired, [employer])  Tobacco Use   Smoking status: Every Day    Current packs/day: 1.00    Average packs/day: 1 pack/day for 60.0 years (60.0 ttl pk-yrs)    Types: Cigarettes   Smokeless tobacco: Never  Vaping Use   Vaping status: Never Used  Substance and Sexual Activity   Alcohol use: No   Drug use: No   Sexual activity: Never  Other Topics Concern   Not on file  Social History Narrative   Husband died September 01, 2025      Has living will   Has health care POA--- Shona Ned, friend   Considering change to her newly found son (that had been adopted)   Requests  DNR--- form done 12/22/14   No tube feedings if cognitively unaware   Social Drivers of Health   Tobacco Use: High Risk (11/01/2024)   Patient History    Smoking Tobacco Use: Every Day    Smokeless Tobacco Use: Never    Passive Exposure: Not on file  Financial Resource Strain: Low Risk (11/09/2024)   Overall Financial Resource Strain (CARDIA)    Difficulty of Paying Living Expenses: Not hard at all  Food Insecurity: No Food Insecurity (11/09/2024)   Epic    Worried About Programme Researcher, Broadcasting/film/video in the Last Year: Never true    Ran Out of Food in the Last Year: Never true  Transportation Needs: No Transportation Needs (11/09/2024)   Epic    Lack of Transportation (Medical): No    Lack of Transportation (Non-Medical): No  Physical Activity: Sufficiently Active (11/09/2024)   Exercise  Vital Sign    Days of Exercise per Week: 3 days    Minutes of Exercise per Session: 60 min  Stress: No Stress Concern Present (11/09/2024)   [employer] of Occupational Health - Occupational Stress Questionnaire    Feeling of Stress: Not at all  Social Connections: Socially Isolated (11/09/2024)   Social Connection and Isolation Panel    Frequency of Communication with Friends and Family: More than three times a week    Frequency of Social Gatherings with Friends and Family: Once a week    Attends Religious Services: Never    Database Administrator or Organizations: No    Attends Engineer, Structural: Not on file    Marital Status: Widowed  Intimate Partner Violence: Not on file  Depression (PHQ2-9): Low Risk (10/02/2023)   Depression (PHQ2-9)    PHQ-2 Score: 0  Alcohol Screen: Not on file  Housing: Unknown (11/09/2024)   Epic    Unable to Pay for Housing in the Last Year: No    Number of Times Moved in the Last Year: Not on file    Homeless in the Last Year: No  Utilities: Not on file  Health Literacy: Not on file     Constitutional: Denies fever, malaise, fatigue, headache or abrupt weight changes.  HEENT: Denies eye pain, eye redness, ear pain, ringing in the ears, wax buildup, runny nose, nasal congestion, bloody nose, or sore throat. Respiratory: Pt reports chronic cough and shortness of breath. Denies difficulty breathing, or sputum production.   Cardiovascular: Denies chest pain, chest tightness, palpitations or swelling in the hands or feet.  Gastrointestinal: Denies abdominal pain, bloating, constipation, diarrhea or blood in the stool.  GU: Denies urgency, frequency, pain with urination, burning sensation, blood in urine, odor or discharge. Musculoskeletal: Denies decrease in range of motion, difficulty with gait, muscle pain or joint pain and swelling.  Skin: Denies redness, rashes, lesions or ulcercations.  Neurological: Patient reports insomnia.  Denies  dizziness, difficulty with memory, difficulty with speech or problems with balance and coordination.  Psych: Patient has a history of anxiety and depression.  Denies SI/HI.  No other specific complaints in a complete review of systems (except as listed in HPI above).  Objective   BP 138/74 (BP Location: Right Arm, Patient Position: Sitting, Cuff Size: Normal)   Ht 5' 1.5 (1.562 m)   Wt 125 lb 12.8 oz (57.1 kg)   BMI 23.38 kg/m   Wt Readings from Last 3 Encounters:  11/01/24 125 lb 12.8 oz (57.1 kg)  04/07/24 119 lb 3.2 oz (54.1 kg)  10/02/23 114 lb (  51.7 kg)    General: Appears her stated age, well developed, well nourished in NAD. Skin: Warm, dry and intact.  HEENT: Head: normal shape and size; Eyes: sclera white, no icterus, conjunctiva pink, PERRLA and EOMs intact;  Neck:  Neck supple, trachea midline. No masses, lumps or thyromegaly present.  Cardiovascular: Normal rate and rhythm. S1,S2 noted.  No murmur, rubs or gallops noted. No JVD or BLE edema. No carotid bruits noted. Pulmonary/Chest: Normal effort and diminished breath sounds. No respiratory distress. No wheezes, rales or ronchi noted.  Abdomen: Soft and nontender. Normal bowel sounds.  Musculoskeletal: Kyphotic.  Pectus carinatum noted.  Strength 5/5 BUE/BLE.  No difficulty with gait.  Neurological: Alert and oriented. Cranial nerves II-XII grossly intact. Coordination normal.  Psychiatric: Mood and affect normal. Behavior is normal. Judgment and thought content normal.    BMET    Component Value Date/Time   NA 138 04/07/2024 1034   NA 143 05/28/2017 1209   K 4.4 04/07/2024 1034   CL 100 04/07/2024 1034   CO2 30 04/07/2024 1034   GLUCOSE 100 (H) 04/07/2024 1034   BUN 18 04/07/2024 1034   BUN 14 05/28/2017 1209   CREATININE 0.73 04/07/2024 1034   CALCIUM  9.5 04/07/2024 1034   GFRNONAA >60 04/22/2019 1923   GFRAA >60 04/22/2019 1923    Lipid Panel     Component Value Date/Time   CHOL 184 04/07/2024 1034    TRIG 79 04/07/2024 1034   HDL 82 04/07/2024 1034   CHOLHDL 2.2 04/07/2024 1034   VLDL 14.6 10/02/2023 1320   LDLCALC 85 04/07/2024 1034    CBC    Component Value Date/Time   WBC 7.1 04/07/2024 1034   RBC 4.81 04/07/2024 1034   HGB 16.0 (H) 04/07/2024 1034   HGB 15.8 05/28/2017 1209   HCT 48.3 (H) 04/07/2024 1034   HCT 47.7 (H) 05/28/2017 1209   PLT 157 04/07/2024 1034   PLT 200 05/28/2017 1209   MCV 100.4 (H) 04/07/2024 1034   MCV 94 05/28/2017 1209   MCH 33.3 (H) 04/07/2024 1034   MCHC 33.1 04/07/2024 1034   RDW 11.6 04/07/2024 1034   RDW 13.3 05/28/2017 1209   LYMPHSABS 2.5 05/28/2017 1209   MONOABS 0.5 12/22/2014 1255   EOSABS 0.1 05/28/2017 1209   BASOSABS 0.0 05/28/2017 1209    Hgb A1C Lab Results  Component Value Date   HGBA1C 5.6 04/07/2024       Assessment and Plan   Preventative health maintenance:  Flu shot today Tetanus UTD Encouraged her to get his COVID booster Pneumovax and Prevnar UTD Zostavax and Shingrix UTD She no longer needs to screen for cervical cancer She no longer wants to screen for breast cancer She does not want to screen for osteoporosis She no longer needs to screen for colon cancer CT lung cancer screening declined Encouraged him to consume a balanced diet and exercise regimen Advised him to see an eye doctor and dentist annually We will check CBC, c-Met, lipid, A1c today    RTC in 6 months for follow-up of chronic conditions  Angeline Laura, NP     [1]  Allergies Allergen Reactions   Cymbalta  [Duloxetine  Hcl] Nausea Only   Wellbutrin  [Bupropion ] Other (See Comments)    Elevated blood pressure, anxiety   Nicotine Palpitations    Nicotine Patches    Penicillins Rash    Has patient had a PCN reaction causing immediate rash, facial/tongue/throat swelling, SOB or lightheadedness with hypotension yes Has patient had a  PCN reaction causing severe rash involving mucus membranes or skin necrosis: no Has patient had a  PCN reaction that required hospitalization no Has patient had a PCN reaction occurring within the last 10 years: no If all of the above answers are NO, then may proceed with Cephalosporin use.   "

## 2024-11-11 ENCOUNTER — Encounter: Payer: Self-pay | Admitting: Internal Medicine

## 2024-11-11 ENCOUNTER — Ambulatory Visit: Payer: Self-pay | Admitting: Internal Medicine

## 2024-11-11 LAB — COMPREHENSIVE METABOLIC PANEL WITH GFR
AG Ratio: 1.8 (calc) (ref 1.0–2.5)
ALT: 10 U/L (ref 6–29)
AST: 15 U/L (ref 10–35)
Albumin: 4.5 g/dL (ref 3.6–5.1)
Alkaline phosphatase (APISO): 102 U/L (ref 37–153)
BUN/Creatinine Ratio: 42 (calc) — ABNORMAL HIGH (ref 6–22)
BUN: 27 mg/dL — ABNORMAL HIGH (ref 7–25)
CO2: 30 mmol/L (ref 20–32)
Calcium: 9.9 mg/dL (ref 8.6–10.4)
Chloride: 99 mmol/L (ref 98–110)
Creat: 0.64 mg/dL (ref 0.60–0.95)
Globulin: 2.5 g/dL (ref 1.9–3.7)
Glucose, Bld: 102 mg/dL — ABNORMAL HIGH (ref 65–99)
Potassium: 5 mmol/L (ref 3.5–5.3)
Sodium: 139 mmol/L (ref 135–146)
Total Bilirubin: 0.6 mg/dL (ref 0.2–1.2)
Total Protein: 7 g/dL (ref 6.1–8.1)
eGFR: 88 mL/min/1.73m2

## 2024-11-11 LAB — CBC
HCT: 48.5 % — ABNORMAL HIGH (ref 35.9–46.0)
Hemoglobin: 16.5 g/dL — ABNORMAL HIGH (ref 11.7–15.5)
MCH: 32.9 pg (ref 27.0–33.0)
MCHC: 34 g/dL (ref 31.6–35.4)
MCV: 96.6 fL (ref 81.4–101.7)
MPV: 10.8 fL (ref 7.5–12.5)
Platelets: 203 Thousand/uL (ref 140–400)
RBC: 5.02 Million/uL (ref 3.80–5.10)
RDW: 11.8 % (ref 11.0–15.0)
WBC: 6.5 Thousand/uL (ref 3.8–10.8)

## 2024-11-11 LAB — LIPID PANEL
Cholesterol: 217 mg/dL — ABNORMAL HIGH
HDL: 75 mg/dL
LDL Cholesterol (Calc): 123 mg/dL — ABNORMAL HIGH
Non-HDL Cholesterol (Calc): 142 mg/dL — ABNORMAL HIGH
Total CHOL/HDL Ratio: 2.9 (calc)
Triglycerides: 88 mg/dL

## 2024-11-11 LAB — HEMOGLOBIN A1C
Hgb A1c MFr Bld: 5.5 %
Mean Plasma Glucose: 111 mg/dL
eAG (mmol/L): 6.2 mmol/L

## 2024-11-11 MED ORDER — FLUTICASONE-SALMETEROL 250-50 MCG/ACT IN AEPB: 1.0000 | INHALATION_SPRAY | Freq: Two times a day (BID) | RESPIRATORY_TRACT | 0 refills | Status: AC

## 2024-11-17 ENCOUNTER — Ambulatory Visit

## 2024-11-29 ENCOUNTER — Ambulatory Visit

## 2024-12-08 ENCOUNTER — Ambulatory Visit

## 2025-01-05 ENCOUNTER — Ambulatory Visit: Payer: Medicare Other

## 2025-04-06 ENCOUNTER — Ambulatory Visit: Payer: Medicare Other

## 2025-05-11 ENCOUNTER — Ambulatory Visit: Admitting: Internal Medicine
